# Patient Record
Sex: Female | Born: 1940 | Race: White | Hispanic: No | Marital: Married | Smoking: Never smoker
Health system: Southern US, Community
[De-identification: ages and names within clinical notes are randomized; demographics above are authoritative.]

## PROBLEM LIST (undated history)

## (undated) ENCOUNTER — Ambulatory Visit: Admission: EM

## (undated) DIAGNOSIS — R011 Cardiac murmur, unspecified: Secondary | ICD-10-CM

## (undated) DIAGNOSIS — G43909 Migraine, unspecified, not intractable, without status migrainosus: Secondary | ICD-10-CM

## (undated) DIAGNOSIS — M1990 Unspecified osteoarthritis, unspecified site: Secondary | ICD-10-CM

## (undated) DIAGNOSIS — I8290 Acute embolism and thrombosis of unspecified vein: Secondary | ICD-10-CM

## (undated) DIAGNOSIS — R7303 Prediabetes: Secondary | ICD-10-CM

## (undated) DIAGNOSIS — I10 Essential (primary) hypertension: Secondary | ICD-10-CM

## (undated) DIAGNOSIS — H269 Unspecified cataract: Secondary | ICD-10-CM

## (undated) DIAGNOSIS — D689 Coagulation defect, unspecified: Secondary | ICD-10-CM

## (undated) DIAGNOSIS — M810 Age-related osteoporosis without current pathological fracture: Secondary | ICD-10-CM

## (undated) HISTORY — PX: EYE SURGERY: SHX253

## (undated) HISTORY — PX: BUNIONECTOMY: SHX129

## (undated) HISTORY — DX: Prediabetes: R73.03

## (undated) HISTORY — PX: ABDOMINAL HYSTERECTOMY: SHX81

## (undated) HISTORY — DX: Coagulation defect, unspecified: D68.9

## (undated) HISTORY — DX: Unspecified osteoarthritis, unspecified site: M19.90

## (undated) HISTORY — DX: Cardiac murmur, unspecified: R01.1

## (undated) HISTORY — PX: KNEE ARTHROSCOPY: SUR90

## (undated) HISTORY — DX: Unspecified cataract: H26.9

## (undated) HISTORY — PX: HERNIA REPAIR: SHX51

## (undated) HISTORY — PX: BACK SURGERY: SHX140

## (undated) HISTORY — DX: Migraine, unspecified, not intractable, without status migrainosus: G43.909

## (undated) HISTORY — DX: Age-related osteoporosis without current pathological fracture: M81.0

## (undated) HISTORY — PX: BREAST SURGERY: SHX581

## (undated) HISTORY — DX: Essential (primary) hypertension: I10

## (undated) HISTORY — PX: INCISIONAL HERNIA REPAIR: SHX193

---

## 2011-09-07 DIAGNOSIS — M76899 Other specified enthesopathies of unspecified lower limb, excluding foot: Secondary | ICD-10-CM | POA: Diagnosis not present

## 2011-10-03 DIAGNOSIS — R079 Chest pain, unspecified: Secondary | ICD-10-CM | POA: Diagnosis not present

## 2011-10-03 DIAGNOSIS — R252 Cramp and spasm: Secondary | ICD-10-CM | POA: Diagnosis not present

## 2011-10-03 DIAGNOSIS — M79609 Pain in unspecified limb: Secondary | ICD-10-CM | POA: Diagnosis not present

## 2011-10-03 DIAGNOSIS — Z79899 Other long term (current) drug therapy: Secondary | ICD-10-CM | POA: Diagnosis not present

## 2011-11-17 DIAGNOSIS — G43909 Migraine, unspecified, not intractable, without status migrainosus: Secondary | ICD-10-CM | POA: Diagnosis not present

## 2011-11-17 DIAGNOSIS — M810 Age-related osteoporosis without current pathological fracture: Secondary | ICD-10-CM | POA: Diagnosis not present

## 2011-11-17 DIAGNOSIS — I10 Essential (primary) hypertension: Secondary | ICD-10-CM | POA: Diagnosis not present

## 2011-11-17 DIAGNOSIS — E785 Hyperlipidemia, unspecified: Secondary | ICD-10-CM | POA: Diagnosis not present

## 2011-11-22 DIAGNOSIS — G43909 Migraine, unspecified, not intractable, without status migrainosus: Secondary | ICD-10-CM | POA: Diagnosis not present

## 2011-11-22 DIAGNOSIS — M810 Age-related osteoporosis without current pathological fracture: Secondary | ICD-10-CM | POA: Diagnosis not present

## 2011-11-22 DIAGNOSIS — I10 Essential (primary) hypertension: Secondary | ICD-10-CM | POA: Diagnosis not present

## 2011-11-22 DIAGNOSIS — E785 Hyperlipidemia, unspecified: Secondary | ICD-10-CM | POA: Diagnosis not present

## 2011-12-27 DIAGNOSIS — M25476 Effusion, unspecified foot: Secondary | ICD-10-CM | POA: Diagnosis not present

## 2011-12-27 DIAGNOSIS — S93419A Sprain of calcaneofibular ligament of unspecified ankle, initial encounter: Secondary | ICD-10-CM | POA: Diagnosis not present

## 2012-01-12 DIAGNOSIS — S298XXA Other specified injuries of thorax, initial encounter: Secondary | ICD-10-CM | POA: Diagnosis not present

## 2012-01-26 DIAGNOSIS — H16229 Keratoconjunctivitis sicca, not specified as Sjogren's, unspecified eye: Secondary | ICD-10-CM | POA: Diagnosis not present

## 2012-01-26 DIAGNOSIS — H2510 Age-related nuclear cataract, unspecified eye: Secondary | ICD-10-CM | POA: Diagnosis not present

## 2012-01-26 DIAGNOSIS — Z961 Presence of intraocular lens: Secondary | ICD-10-CM | POA: Diagnosis not present

## 2012-01-26 DIAGNOSIS — H5200 Hypermetropia, unspecified eye: Secondary | ICD-10-CM | POA: Diagnosis not present

## 2012-02-16 DIAGNOSIS — R05 Cough: Secondary | ICD-10-CM | POA: Diagnosis not present

## 2012-02-16 DIAGNOSIS — I10 Essential (primary) hypertension: Secondary | ICD-10-CM | POA: Diagnosis not present

## 2012-02-16 DIAGNOSIS — M810 Age-related osteoporosis without current pathological fracture: Secondary | ICD-10-CM | POA: Diagnosis not present

## 2012-02-16 DIAGNOSIS — R079 Chest pain, unspecified: Secondary | ICD-10-CM | POA: Diagnosis not present

## 2012-03-21 DIAGNOSIS — Z1231 Encounter for screening mammogram for malignant neoplasm of breast: Secondary | ICD-10-CM | POA: Diagnosis not present

## 2012-03-26 DIAGNOSIS — I10 Essential (primary) hypertension: Secondary | ICD-10-CM | POA: Diagnosis not present

## 2012-05-09 DIAGNOSIS — M76899 Other specified enthesopathies of unspecified lower limb, excluding foot: Secondary | ICD-10-CM | POA: Diagnosis not present

## 2012-05-09 DIAGNOSIS — Z23 Encounter for immunization: Secondary | ICD-10-CM | POA: Diagnosis not present

## 2012-05-14 DIAGNOSIS — M461 Sacroiliitis, not elsewhere classified: Secondary | ICD-10-CM | POA: Diagnosis not present

## 2012-06-26 DIAGNOSIS — M810 Age-related osteoporosis without current pathological fracture: Secondary | ICD-10-CM | POA: Diagnosis not present

## 2012-06-26 DIAGNOSIS — I10 Essential (primary) hypertension: Secondary | ICD-10-CM | POA: Diagnosis not present

## 2012-07-16 DIAGNOSIS — L259 Unspecified contact dermatitis, unspecified cause: Secondary | ICD-10-CM | POA: Diagnosis not present

## 2012-08-02 DIAGNOSIS — M810 Age-related osteoporosis without current pathological fracture: Secondary | ICD-10-CM | POA: Diagnosis not present

## 2012-08-02 DIAGNOSIS — Z79899 Other long term (current) drug therapy: Secondary | ICD-10-CM | POA: Diagnosis not present

## 2012-08-02 DIAGNOSIS — I10 Essential (primary) hypertension: Secondary | ICD-10-CM | POA: Diagnosis not present

## 2012-08-24 DIAGNOSIS — Z13820 Encounter for screening for osteoporosis: Secondary | ICD-10-CM | POA: Diagnosis not present

## 2012-08-24 DIAGNOSIS — M899 Disorder of bone, unspecified: Secondary | ICD-10-CM | POA: Diagnosis not present

## 2012-09-03 ENCOUNTER — Telehealth: Payer: Self-pay | Admitting: *Deleted

## 2012-09-03 ENCOUNTER — Ambulatory Visit (INDEPENDENT_AMBULATORY_CARE_PROVIDER_SITE_OTHER): Payer: Medicare Other | Admitting: Internal Medicine

## 2012-09-03 ENCOUNTER — Encounter: Payer: Self-pay | Admitting: Internal Medicine

## 2012-09-03 VITALS — BP 170/80 | HR 94 | Temp 97.6°F | Resp 18 | Ht <= 58 in | Wt 106.0 lb

## 2012-09-03 DIAGNOSIS — M810 Age-related osteoporosis without current pathological fracture: Secondary | ICD-10-CM

## 2012-09-03 DIAGNOSIS — Z0000 Encounter for general adult medical examination without abnormal findings: Secondary | ICD-10-CM

## 2012-09-03 DIAGNOSIS — E785 Hyperlipidemia, unspecified: Secondary | ICD-10-CM

## 2012-09-03 DIAGNOSIS — G43909 Migraine, unspecified, not intractable, without status migrainosus: Secondary | ICD-10-CM

## 2012-09-03 DIAGNOSIS — Z90710 Acquired absence of both cervix and uterus: Secondary | ICD-10-CM

## 2012-09-03 DIAGNOSIS — Z862 Personal history of diseases of the blood and blood-forming organs and certain disorders involving the immune mechanism: Secondary | ICD-10-CM

## 2012-09-03 DIAGNOSIS — I10 Essential (primary) hypertension: Secondary | ICD-10-CM

## 2012-09-03 DIAGNOSIS — M1990 Unspecified osteoarthritis, unspecified site: Secondary | ICD-10-CM | POA: Diagnosis not present

## 2012-09-03 DIAGNOSIS — K219 Gastro-esophageal reflux disease without esophagitis: Secondary | ICD-10-CM

## 2012-09-03 DIAGNOSIS — E782 Mixed hyperlipidemia: Secondary | ICD-10-CM | POA: Insufficient documentation

## 2012-09-03 DIAGNOSIS — R0789 Other chest pain: Secondary | ICD-10-CM

## 2012-09-03 HISTORY — DX: Personal history of diseases of the blood and blood-forming organs and certain disorders involving the immune mechanism: Z86.2

## 2012-09-03 HISTORY — DX: Acquired absence of both cervix and uterus: Z90.710

## 2012-09-03 MED ORDER — LOSARTAN POTASSIUM 50 MG PO TABS: 50.0000 mg | ORAL_TABLET | Freq: Every day | ORAL | Status: DC

## 2012-09-03 NOTE — Progress Notes (Signed)
Subjective:    Patient ID: Mercedes Decker, female    DOB: [birth year], 72 y.o.   MRN: [id number]  HPI New pt .  Here for first visit.  Former care [facility] Dr. Helyn Numbers.  PMH of DJD, HTN. Migraine headache, osteoporosis on Reclast,  S/P hysterectomy and S/P bilateral breast implant removal.    Pt is concerned about two issues.  She has HTN but has stopped meds as she reports she cannot tolerate Amlodipine 5 mg and lisinopril.  She is on no meds now  See BP today  Labs brought with her done 08/02/2012 show total choleterol 211 HDL 82 and ldl 109.  Chemistries are normal. She is a non smoker and does have FH of MI in father and brother and stroke in two sisters  She also has L sided lower chest pain that radiates to her R shoulder that has occurred off and one for 2 weeks lasting 4-5 minutes.  No N/V, SOB at times especially with exertion, no diaphoresis.  She reports she does have a cardiologist in [place] (cannot recall name today) and that she had an angiogram 4 years ago and was told there was no blockage.  There is no radiation down L arm or into jaw  Last episode was last night.    Allergies  Allergen Reactions  . Vicodin (Hydrocodone-Acetaminophen) Other (See Comments)    Pt states that she stops breathing when she takes  Pain meds   Past Medical History  Diagnosis Date  . Hypertension   . Arthritis   . Migraine   . Osteoporosis    Past Surgical History  Procedure Date  . Abdominal hysterectomy   . Breast surgery   . Back surgery   . Knee arthroscopy   . Incisional hernia repair   . Bunionectomy    History   Social History  . Marital Status: Married    Spouse Name: N/A    Number of Children: N/A  . Years of Education: N/A   Occupational History  . Not on file.   Social History Main Topics  . Smoking status: Never Smoker   . Smokeless tobacco: Not on file  . Alcohol Use: No  . Drug Use: No  . Sexually Active: Yes    Birth Control/ Protection:  Post-menopausal   Other Topics Concern  . Not on file   Social History Narrative  . No narrative on file   Family History  Problem Relation Age of Onset  . Alzheimer's disease Mother   . Heart disease Father   . Cancer Sister   . Diabetes Sister   . Heart disease Brother   . Diabetes Brother   . Cancer Brother    There is no problem list on file for this patient.  Current Outpatient Prescriptions on File Prior to Visit  Medication Sig Dispense Refill  . amLODipine (NORVASC) 5 MG tablet Take 5 mg by mouth daily.      . SUMAtriptan (IMITREX) 6 MG/0.5ML SOLN injection Inject 6 mg into the skin every 2 (two) hours as needed. F           Review of Systems See HPI    Objective:   Physical Exam  Physical Exam  Nursing note and vitals reviewed.   Repeat BP  170/80  L arm Constitutional: She is oriented to person, place, and time. She appears well-developed and well-nourished.  HENT:  Head: Normocephalic and atraumatic.  Cardiovascular: Normal rate and regular rhythm. Exam reveals no  gallop and no friction rub.  No murmur heard.  Pulmonary/Chest: Breath sounds normal. She has no wheezes. She has no rales.  Ext:  No edema Neurological: She is alert and oriented to person, place, and time.  Skin: Skin is warm and dry.  Psychiatric: She has a normal mood and affect. Her behavior is normal.        Assessment & Plan:  Atypical chest pain:  EKG today shows NSR and no acute changes.  Etiology of pain unclear at this point and in part may be related to uncontrolled HTN.  See below.  I counseled pt to start a baby aspirin daily and she is to call her cardiologist as soon as she returns home for appt in his office.  It is reassuring that 4 years ago she reports a normal angiogram.  Will have pt sign for old records  Uncontrolled HTN:  I counseled pt it is absolutely essential that she take her BP pill everyday.   Will start  Cozaar 50 mg and she is to see me in one week.     History of migraine headache  Continue Imitrex  DJD  Osteoporosis  She is under care of rheumatologist and receives IV reclast  S/P hysterectomy

## 2012-09-03 NOTE — Telephone Encounter (Signed)
Pt called with  Information regarding her last stress test which was 2007 pt was not able to get an appt before vacation will call office

## 2012-09-04 ENCOUNTER — Encounter: Payer: Self-pay | Admitting: *Deleted

## 2012-09-04 LAB — CBC WITH DIFFERENTIAL/PLATELET
Basophils Absolute: 0 10*3/uL (ref 0.0–0.1)
HCT: 39.4 % (ref 36.0–46.0)
Lymphocytes Relative: 24 % (ref 12–46)
Monocytes Absolute: 0.7 10*3/uL (ref 0.1–1.0)
Neutro Abs: 5.9 10*3/uL (ref 1.7–7.7)
Neutrophils Relative %: 68 % (ref 43–77)
RDW: 13.2 % (ref 11.5–15.5)
WBC: 8.6 10*3/uL (ref 4.0–10.5)

## 2012-09-04 NOTE — Patient Instructions (Addendum)
See me in one week

## 2012-09-05 DIAGNOSIS — R21 Rash and other nonspecific skin eruption: Secondary | ICD-10-CM | POA: Diagnosis not present

## 2012-09-05 DIAGNOSIS — G5700 Lesion of sciatic nerve, unspecified lower limb: Secondary | ICD-10-CM | POA: Diagnosis not present

## 2012-09-10 DIAGNOSIS — I10 Essential (primary) hypertension: Secondary | ICD-10-CM | POA: Diagnosis not present

## 2012-09-10 DIAGNOSIS — R0789 Other chest pain: Secondary | ICD-10-CM | POA: Diagnosis not present

## 2012-09-11 ENCOUNTER — Encounter: Payer: Self-pay | Admitting: Internal Medicine

## 2012-09-11 ENCOUNTER — Ambulatory Visit (INDEPENDENT_AMBULATORY_CARE_PROVIDER_SITE_OTHER): Payer: Medicare Other | Admitting: Internal Medicine

## 2012-09-11 VITALS — BP 148/78 | HR 80 | Temp 97.7°F | Resp 18 | Wt 107.0 lb

## 2012-09-11 DIAGNOSIS — I10 Essential (primary) hypertension: Secondary | ICD-10-CM

## 2012-09-11 DIAGNOSIS — I359 Nonrheumatic aortic valve disorder, unspecified: Secondary | ICD-10-CM | POA: Diagnosis not present

## 2012-09-11 DIAGNOSIS — I059 Rheumatic mitral valve disease, unspecified: Secondary | ICD-10-CM | POA: Diagnosis not present

## 2012-09-11 DIAGNOSIS — R011 Cardiac murmur, unspecified: Secondary | ICD-10-CM

## 2012-09-11 DIAGNOSIS — I379 Nonrheumatic pulmonary valve disorder, unspecified: Secondary | ICD-10-CM | POA: Diagnosis not present

## 2012-09-11 DIAGNOSIS — R0789 Other chest pain: Secondary | ICD-10-CM | POA: Diagnosis not present

## 2012-09-11 DIAGNOSIS — M810 Age-related osteoporosis without current pathological fracture: Secondary | ICD-10-CM

## 2012-09-11 DIAGNOSIS — E785 Hyperlipidemia, unspecified: Secondary | ICD-10-CM

## 2012-09-11 DIAGNOSIS — I369 Nonrheumatic tricuspid valve disorder, unspecified: Secondary | ICD-10-CM | POA: Diagnosis not present

## 2012-09-11 NOTE — Patient Instructions (Addendum)
See me at CPE 

## 2012-09-11 NOTE — Progress Notes (Signed)
Subjective:    Patient ID: Mercedes Decker, female    DOB: [birth year], 72 y.o.   MRN: [id number]  HPI Mercedes Decker returns for follow up after taking Cozaar for one week.  She has been checking her BP at home and last night she reports SBP  128.  She is asymptomatic  She did see her cardiologist in [place].  She has a stress test and an ECHO scheduled for today  Allergies  Allergen Reactions  . Vicodin (Hydrocodone-Acetaminophen) Other (See Comments)    Pt states that she stops breathing when she takes  Pain meds   Past Medical History  Diagnosis Date  . Hypertension   . Arthritis   . Migraine   . Osteoporosis    Past Surgical History  Procedure Date  . Abdominal hysterectomy   . Breast surgery   . Back surgery   . Knee arthroscopy   . Incisional hernia repair   . Bunionectomy    History   Social History  . Marital Status: Married    Spouse Name: N/A    Number of Children: N/A  . Years of Education: N/A   Occupational History  . Not on file.   Social History Main Topics  . Smoking status: Never Smoker   . Smokeless tobacco: Not on file  . Alcohol Use: No  . Drug Use: No  . Sexually Active: Yes    Birth Control/ Protection: Post-menopausal   Other Topics Concern  . Not on file   Social History Narrative  . No narrative on file   Family History  Problem Relation Age of Onset  . Alzheimer's disease Mother   . Heart disease Father   . Cancer Sister   . Diabetes Sister   . Heart disease Brother   . Diabetes Brother   . Cancer Brother    Patient Active Problem List  Diagnosis  . DJD (degenerative joint disease)  . HTN (hypertension)  . S/P hysterectomy  . Osteoporosis  . Migraine  . History of anemia  . Hyperlipidemia  . GERD (gastroesophageal reflux disease)  . Murmur, cardiac   Current Outpatient Prescriptions on File Prior to Visit  Medication Sig Dispense Refill  . CALCIUM-MAGNESIUM-ZINC PO Take by mouth.      . cholecalciferol (VITAMIN D)  1000 UNITS tablet Take 2,000 Units by mouth daily.      Marland Kitchen losartan (COZAAR) 50 MG tablet Take 1 tablet (50 mg total) by mouth daily.  30 tablet  1  . pyridOXINE (VITAMIN B-6) 100 MG tablet Take 100 mg by mouth daily.      . SUMAtriptan (IMITREX) 6 MG/0.5ML SOLN injection Inject 6 mg into the skin every 2 (two) hours as needed. F      . vitamin C (ASCORBIC ACID) 500 MG tablet Take 500 mg by mouth daily.      . Zoledronic Acid (RECLAST IV) Inject into the vein.      Marland Kitchen amLODipine (NORVASC) 5 MG tablet Take 5 mg by mouth daily.           Review of Systems See HPI    Objective:   Physical Exam  Physical Exam  Nursing note and vitals reviewed.  Constitutional: She is oriented to person, place, and time. She appears well-developed and well-nourished.  HENT:  Head: Normocephalic and atraumatic.  Cardiovascular: Normal rate and regular rhythm. Exam reveals no gallop and no friction rub.  She has soft 2/6 SEM  LUSB.  Pulmonary/Chest: Breath sounds normal. She has  no wheezes. She has no rales.  Neurological: She is alert and oriented to person, place, and time.  Skin: Skin is warm and dry.  Psychiatric: She has a normal mood and affect. Her behavior is normal.       Assessment & Plan:  HTN  Continue Cozaar 50 mg daily  Keep CPE appt with me  Cardiac murmur:  She has ECHO scheduled for today  Hyperlipidemia  DAsh diet  Will further discuss at [facility]

## 2012-09-17 ENCOUNTER — Encounter: Payer: Self-pay | Admitting: *Deleted

## 2012-11-06 ENCOUNTER — Telehealth: Payer: Self-pay | Admitting: *Deleted

## 2012-11-06 ENCOUNTER — Ambulatory Visit (INDEPENDENT_AMBULATORY_CARE_PROVIDER_SITE_OTHER): Payer: Medicare Other | Admitting: Internal Medicine

## 2012-11-06 ENCOUNTER — Encounter: Payer: Self-pay | Admitting: Internal Medicine

## 2012-11-06 VITALS — BP 136/70 | HR 86 | Temp 97.1°F | Resp 18 | Ht <= 58 in | Wt 108.0 lb

## 2012-11-06 DIAGNOSIS — E785 Hyperlipidemia, unspecified: Secondary | ICD-10-CM

## 2012-11-06 DIAGNOSIS — G43909 Migraine, unspecified, not intractable, without status migrainosus: Secondary | ICD-10-CM

## 2012-11-06 DIAGNOSIS — M810 Age-related osteoporosis without current pathological fracture: Secondary | ICD-10-CM

## 2012-11-06 DIAGNOSIS — Z139 Encounter for screening, unspecified: Secondary | ICD-10-CM

## 2012-11-06 DIAGNOSIS — I10 Essential (primary) hypertension: Secondary | ICD-10-CM

## 2012-11-06 DIAGNOSIS — R011 Cardiac murmur, unspecified: Secondary | ICD-10-CM

## 2012-11-06 DIAGNOSIS — K219 Gastro-esophageal reflux disease without esophagitis: Secondary | ICD-10-CM

## 2012-11-06 DIAGNOSIS — R82998 Other abnormal findings in urine: Secondary | ICD-10-CM

## 2012-11-06 LAB — POCT URINALYSIS DIPSTICK
Bilirubin, UA: NEGATIVE
Blood, UA: NEGATIVE
Glucose, UA: NEGATIVE
Ketones, UA: NEGATIVE
Nitrite, UA: NEGATIVE
Spec Grav, UA: 1.015
pH, UA: 6.5

## 2012-11-06 NOTE — Telephone Encounter (Signed)
MM record request

## 2012-11-06 NOTE — Progress Notes (Signed)
Subjective:    Patient ID: Mercedes Decker, female    DOB: [birth year], 72 y.o.   MRN: [id number]  HPI [name] is here for CPE  HTN tolerating Cozaar well Headaches are gone.   No chest pain or pressure  She does have some sharp pain underneath L ribs that she believes may be due to heavy shoveling.  No radiation,m no SOB,  No diaphroesis or N/V  She gardens and uses chain saw  "I think I do too much"   She is on Reclast for osteoporosis and since starting Reclast she notices cramps in fingers at times they get "stuck" and they won't move.  She takes supplemental Mg, calcium and zinc along with a Multivirtamin  She declines guaiac today as she recently had a colonoscopy in the last 2 years.  No polyps reported  MM last done [facility]  It has been less than one year  She did see her cardiologist and she reports she had an ECHO and stress test that pt reports is normal  I do not have copy  Allergies  Allergen Reactions  . Ace Inhibitors Cough  . Vicodin (Hydrocodone-Acetaminophen) Other (See Comments)    Pt states that she stops breathing when she takes  Pain meds   Past Medical History  Diagnosis Date  . Hypertension   . Arthritis   . Migraine   . Osteoporosis    Past Surgical History  Procedure Laterality Date  . Abdominal hysterectomy    . Breast surgery    . Back surgery    . Knee arthroscopy    . Incisional hernia repair    . Bunionectomy     History   Social History  . Marital Status: Married    Spouse Name: N/A    Number of Children: N/A  . Years of Education: N/A   Occupational History  . Not on file.   Social History Main Topics  . Smoking status: Never Smoker   . Smokeless tobacco: Not on file  . Alcohol Use: No  . Drug Use: No  . Sexually Active: Yes    Birth Control/ Protection: Post-menopausal   Other Topics Concern  . Not on file   Social History Narrative  . No narrative on file   Family History  Problem Relation Age of Onset  .  Alzheimer's disease Mother   . Heart disease Father   . Cancer Sister   . Diabetes Sister   . Heart disease Brother   . Diabetes Brother   . Cancer Brother    Patient Active Problem List  Diagnosis  . DJD (degenerative joint disease)  . HTN (hypertension)  . S/P hysterectomy  . Osteoporosis  . Migraine  . History of anemia  . Hyperlipidemia  . GERD (gastroesophageal reflux disease)  . Murmur, cardiac   Current Outpatient Prescriptions on File Prior to Visit  Medication Sig Dispense Refill  . CALCIUM-MAGNESIUM-ZINC PO Take by mouth.      . cholecalciferol (VITAMIN D) 1000 UNITS tablet Take 2,000 Units by mouth daily.      Marland Kitchen losartan (COZAAR) 50 MG tablet Take 1 tablet (50 mg total) by mouth daily.  30 tablet  1  . pyridOXINE (VITAMIN B-6) 100 MG tablet Take 100 mg by mouth daily.      . SUMAtriptan (IMITREX) 6 MG/0.5ML SOLN injection Inject 6 mg into the skin every 2 (two) hours as needed. F      . vitamin C (ASCORBIC ACID) 500 MG  tablet Take 500 mg by mouth daily.      . Zoledronic Acid (RECLAST IV) Inject into the vein.       No current facility-administered medications on file prior to visit.       Review of Systems See HPI    Objective:   Physical Exam Physical Exam  Nursing note and vitals reviewed.  Constitutional: She is oriented to person, place, and time. She appears well-developed and well-nourished.  HENT:  Head: Normocephalic and atraumatic.  Right Ear: Tympanic membrane and ear canal normal. No drainage. Tympanic membrane is not injected and not erythematous.  Left Ear: Tympanic membrane and ear canal normal. No drainage. Tympanic membrane is not injected and not erythematous.  Nose: Nose normal. Right sinus exhibits no maxillary sinus tenderness and no frontal sinus tenderness. Left sinus exhibits no maxillary sinus tenderness and no frontal sinus tenderness.  Mouth/Throat: Oropharynx is clear and moist. No oral lesions. No oropharyngeal exudate.  Eyes:  Conjunctivae and EOM are normal. Pupils are equal, round, and reactive to light.  Neck: Normal range of motion. Neck supple. No JVD present. Carotid bruit is not present. No mass and no thyromegaly present.  Cardiovascular: Normal rate, regular rhythm, S1 normal, S2 normal and intact distal pulses. Exam reveals no gallop and no friction rub.  No murmur heard.  Pulses:  Carotid pulses are 2+ on the right side, and 2+ on the left side.  Dorsalis pedis pulses are 2+ on the right side, and 2+ on the left side.  No carotid bruit. No LE edema  Pulmonary/Chest: Breath sounds normal. She has no wheezes. She has no rales. She exhibits no tenderness.   Breast no discrete masses no nipple discharge no axillary adenopathy Abdominal: Soft. Bowel sounds are normal. She exhibits no distension and no mass. There is no hepatosplenomegaly. There is no tenderness. There is no CVA tenderness.  Rectal deferred Musculoskeletal: Normal range of motion.  No active synovitis to joints.  Lymphadenopathy:  She has no cervical adenopathy.  She has no axillary adenopathy.  Right: No inguinal and no supraclavicular adenopathy present.  Left: No inguinal and no supraclavicular adenopathy present.  Neurological: She is alert and oriented to person, place, and time. She has normal strength and normal reflexes. She displays no tremor. No cranial nerve deficit or sensory deficit. Coordination and gait normal.  Skin: Skin is warm and dry. No rash noted. No cyanosis. Nails show no clubbing.  Psychiatric: She has a normal mood and affect. Her speech is normal and behavior is normal. Cognition and memory are normal.           Assessment & Plan:  Health Maintenance  Will need to get mm from [facility].  S/P hysterectomy  See scanned sheet  U/A  Shows mod leukocytes  Pt asymptomatic  Will send for culture  HTN  Continue cozaar  Check TSH  Heat Murmur  Pt to send records from Dr. Tiburcio Pea cardiologist   Muscle cramping   Possible due to REclast  I advised pt  To check with her rheumatologist.  She is already taking Mg supplements  R sided thoracic pain  Ok to take OTC Ibuprofen  May be muscle related.  She is to see me if office if not better.   GERD    Hyperlipidemia  DASH diet  Mild  Osteoprosis on REclast

## 2012-11-06 NOTE — Patient Instructions (Addendum)
See me as needed 

## 2012-11-07 ENCOUNTER — Telehealth: Payer: Self-pay | Admitting: *Deleted

## 2012-11-15 NOTE — Telephone Encounter (Signed)
error 

## 2012-11-16 LAB — URINE CULTURE

## 2012-11-19 ENCOUNTER — Encounter: Payer: Self-pay | Admitting: *Deleted

## 2012-11-19 DIAGNOSIS — E785 Hyperlipidemia, unspecified: Secondary | ICD-10-CM | POA: Diagnosis not present

## 2012-11-19 DIAGNOSIS — Z7983 Long term (current) use of bisphosphonates: Secondary | ICD-10-CM | POA: Diagnosis not present

## 2012-11-19 DIAGNOSIS — M810 Age-related osteoporosis without current pathological fracture: Secondary | ICD-10-CM | POA: Diagnosis not present

## 2012-11-19 DIAGNOSIS — I10 Essential (primary) hypertension: Secondary | ICD-10-CM | POA: Diagnosis not present

## 2012-11-21 DIAGNOSIS — Z7983 Long term (current) use of bisphosphonates: Secondary | ICD-10-CM | POA: Diagnosis not present

## 2012-11-21 DIAGNOSIS — M810 Age-related osteoporosis without current pathological fracture: Secondary | ICD-10-CM | POA: Diagnosis not present

## 2012-11-21 DIAGNOSIS — E785 Hyperlipidemia, unspecified: Secondary | ICD-10-CM | POA: Diagnosis not present

## 2012-11-21 DIAGNOSIS — I10 Essential (primary) hypertension: Secondary | ICD-10-CM | POA: Diagnosis not present

## 2012-11-25 ENCOUNTER — Encounter: Payer: Self-pay | Admitting: Internal Medicine

## 2012-12-01 ENCOUNTER — Encounter: Payer: Self-pay | Admitting: Internal Medicine

## 2012-12-10 ENCOUNTER — Encounter: Payer: Self-pay | Admitting: *Deleted

## 2013-01-22 DIAGNOSIS — M722 Plantar fascial fibromatosis: Secondary | ICD-10-CM | POA: Diagnosis not present

## 2013-01-31 ENCOUNTER — Other Ambulatory Visit: Payer: Self-pay | Admitting: Internal Medicine

## 2013-01-31 NOTE — Telephone Encounter (Signed)
Refill request

## 2013-02-21 DIAGNOSIS — M722 Plantar fascial fibromatosis: Secondary | ICD-10-CM | POA: Diagnosis not present

## 2013-02-21 DIAGNOSIS — M25473 Effusion, unspecified ankle: Secondary | ICD-10-CM | POA: Diagnosis not present

## 2013-02-21 DIAGNOSIS — M25476 Effusion, unspecified foot: Secondary | ICD-10-CM | POA: Diagnosis not present

## 2013-02-27 DIAGNOSIS — H2510 Age-related nuclear cataract, unspecified eye: Secondary | ICD-10-CM | POA: Diagnosis not present

## 2013-02-27 DIAGNOSIS — H16229 Keratoconjunctivitis sicca, not specified as Sjogren's, unspecified eye: Secondary | ICD-10-CM | POA: Diagnosis not present

## 2013-02-27 DIAGNOSIS — H5200 Hypermetropia, unspecified eye: Secondary | ICD-10-CM | POA: Diagnosis not present

## 2013-02-27 DIAGNOSIS — H524 Presbyopia: Secondary | ICD-10-CM | POA: Diagnosis not present

## 2013-03-04 ENCOUNTER — Other Ambulatory Visit: Payer: Self-pay | Admitting: *Deleted

## 2013-03-04 NOTE — Telephone Encounter (Signed)
[name]  I only gave a 30 day supply as she needs to have chemistries done  I have no K on her.  Schedule OV with me,  If she just wants to do labs and not have an office visit.  Order CMP  When I have results I can order a 90 day supply.  I

## 2013-03-04 NOTE — Telephone Encounter (Signed)
Pt request a 90 day supply

## 2013-03-15 DIAGNOSIS — M76899 Other specified enthesopathies of unspecified lower limb, excluding foot: Secondary | ICD-10-CM | POA: Diagnosis not present

## 2013-03-25 NOTE — Telephone Encounter (Signed)
Notified pt of need for labs for further refills via VM message

## 2013-03-28 DIAGNOSIS — M722 Plantar fascial fibromatosis: Secondary | ICD-10-CM | POA: Diagnosis not present

## 2013-04-03 ENCOUNTER — Emergency Department (HOSPITAL_BASED_OUTPATIENT_CLINIC_OR_DEPARTMENT_OTHER): Payer: Medicare Other

## 2013-04-03 ENCOUNTER — Encounter: Payer: Self-pay | Admitting: Internal Medicine

## 2013-04-03 ENCOUNTER — Encounter (HOSPITAL_BASED_OUTPATIENT_CLINIC_OR_DEPARTMENT_OTHER): Payer: Self-pay | Admitting: *Deleted

## 2013-04-03 ENCOUNTER — Encounter: Payer: Medicare Other | Admitting: Internal Medicine

## 2013-04-03 ENCOUNTER — Emergency Department (HOSPITAL_BASED_OUTPATIENT_CLINIC_OR_DEPARTMENT_OTHER)
Admission: EM | Admit: 2013-04-03 | Discharge: 2013-04-03 | Disposition: A | Discharge status: Home / Self Care | Payer: Medicare Other | Attending: Emergency Medicine | Admitting: Emergency Medicine

## 2013-04-03 DIAGNOSIS — Z79899 Other long term (current) drug therapy: Secondary | ICD-10-CM | POA: Insufficient documentation

## 2013-04-03 DIAGNOSIS — R112 Nausea with vomiting, unspecified: Secondary | ICD-10-CM | POA: Insufficient documentation

## 2013-04-03 DIAGNOSIS — R197 Diarrhea, unspecified: Secondary | ICD-10-CM | POA: Diagnosis not present

## 2013-04-03 DIAGNOSIS — G43909 Migraine, unspecified, not intractable, without status migrainosus: Secondary | ICD-10-CM | POA: Diagnosis not present

## 2013-04-03 DIAGNOSIS — R51 Headache: Secondary | ICD-10-CM | POA: Diagnosis not present

## 2013-04-03 DIAGNOSIS — Z90710 Acquired absence of both cervix and uterus: Secondary | ICD-10-CM | POA: Diagnosis not present

## 2013-04-03 DIAGNOSIS — I10 Essential (primary) hypertension: Secondary | ICD-10-CM | POA: Diagnosis not present

## 2013-04-03 DIAGNOSIS — Z9889 Other specified postprocedural states: Secondary | ICD-10-CM | POA: Insufficient documentation

## 2013-04-03 DIAGNOSIS — M810 Age-related osteoporosis without current pathological fracture: Secondary | ICD-10-CM | POA: Insufficient documentation

## 2013-04-03 DIAGNOSIS — M129 Arthropathy, unspecified: Secondary | ICD-10-CM | POA: Diagnosis not present

## 2013-04-03 MED ORDER — SODIUM CHLORIDE 0.9 % IV BOLUS (SEPSIS)
1000.0000 mL | Freq: Once | INTRAVENOUS | Status: AC
  Administered 2013-04-03: 1000 mL via INTRAVENOUS

## 2013-04-03 MED ORDER — METOCLOPRAMIDE HCL 5 MG/ML IJ SOLN
10.0000 mg | Freq: Once | INTRAMUSCULAR | Status: AC
  Administered 2013-04-03: 10 mg via INTRAVENOUS
  Filled 2013-04-03: qty 2

## 2013-04-03 MED ORDER — ONDANSETRON HCL 4 MG/2ML IJ SOLN
4.0000 mg | Freq: Once | INTRAMUSCULAR | Status: AC
  Administered 2013-04-03: 4 mg via INTRAVENOUS
  Filled 2013-04-03: qty 2

## 2013-04-03 MED ORDER — DIPHENHYDRAMINE HCL 50 MG/ML IJ SOLN
25.0000 mg | Freq: Once | INTRAMUSCULAR | Status: AC
  Administered 2013-04-03: 25 mg via INTRAVENOUS
  Filled 2013-04-03: qty 1

## 2013-04-03 MED ORDER — DEXAMETHASONE SODIUM PHOSPHATE 10 MG/ML IJ SOLN
10.0000 mg | Freq: Once | INTRAMUSCULAR | Status: AC
  Administered 2013-04-03: 10 mg via INTRAVENOUS
  Filled 2013-04-03: qty 1

## 2013-04-03 MED ORDER — SUMATRIPTAN SUCCINATE 6 MG/0.5ML ~~LOC~~ SOAJ: 1.0000 | Freq: Once | SUBCUTANEOUS | Status: DC

## 2013-04-03 NOTE — ED Provider Notes (Signed)
CSN: [id number]     Arrival date & time 04/03/13  1425 History     First MD Initiated Contact with Patient 04/03/13 1501     Chief Complaint  Patient presents with  . Migraine   (Consider location/radiation/quality/duration/timing/severity/associated sxs/prior Treatment) HPI Comments: Hx of migraines, out of imitrex today. Woke up with headache, similar to all previous migraines. Diffuse headache with associated nausea and vomiting.   Patient is a 72 y.o. female presenting with migraines. The history is provided by the patient.  Migraine This is a recurrent problem. The current episode started 6 to 12 hours ago. The problem occurs constantly. The problem has not changed since onset.Associated symptoms include headaches. Pertinent negatives include no chest pain, no abdominal pain and no shortness of breath. Exacerbated by: lights, sounds. Nothing relieves the symptoms. Treatments tried: NSAIDs. The treatment provided no relief.    Past Medical History  Diagnosis Date  . Hypertension   . Arthritis   . Migraine   . Osteoporosis    Past Surgical History  Procedure Laterality Date  . Abdominal hysterectomy    . Breast surgery    . Back surgery    . Knee arthroscopy    . Incisional hernia repair    . Bunionectomy     Family History  Problem Relation Age of Onset  . Alzheimer's disease Mother   . Heart disease Father   . Cancer Sister   . Diabetes Sister   . Heart disease Brother   . Diabetes Brother   . Cancer Brother    History  Substance Use Topics  . Smoking status: Never Smoker   . Smokeless tobacco: Not on file  . Alcohol Use: No   OB History   Grav Para Term Preterm Abortions TAB SAB Ect Mult Living                 Review of Systems  Constitutional: Negative for fever and chills.  Respiratory: Negative for shortness of breath.   Cardiovascular: Negative for chest pain.  Gastrointestinal: Positive for nausea, vomiting and diarrhea. Negative for abdominal  pain.  Neurological: Positive for headaches.  All other systems reviewed and are negative.    Allergies  Ace inhibitors and Vicodin  Home Medications   Current Outpatient Rx  Name  Route  Sig  Dispense  Refill  . CALCIUM-MAGNESIUM-ZINC PO   Oral   Take by mouth.         . cholecalciferol (VITAMIN D) 1000 UNITS tablet   Oral   Take 2,000 Units by mouth daily.         Marland Kitchen losartan (COZAAR) 50 MG tablet   Oral   Take 1 tablet (50 mg total) by mouth daily.   30 tablet   1   . pyridOXINE (VITAMIN B-6) 100 MG tablet   Oral   Take 100 mg by mouth daily.         . SUMAtriptan 6 MG/0.5ML SOAJ   Subcutaneous   Inject 1 Syringe into the skin once. Inject 6 mg into skin at onset of headache one time   1 Syringe   3   . vitamin C (ASCORBIC ACID) 500 MG tablet   Oral   Take 500 mg by mouth daily.          BP 147/93  Pulse 78  Temp(Src) 97.8 F (36.6 C) (Oral)  Resp 18  Ht 4\' 11"  (1.499 m)  Wt 106 lb (48.081 kg)  BMI 21.4 kg/m2  SpO2  100% Physical Exam  Nursing note and vitals reviewed. Constitutional: She is oriented to person, place, and time. She appears well-developed and well-nourished. No distress.  HENT:  Head: Normocephalic and atraumatic.  Right Ear: External ear normal.  Left Ear: External ear normal.  Mouth/Throat: Oropharynx is clear and moist.  Eyes: EOM are normal. Pupils are equal, round, and reactive to light.  Neck: Normal range of motion. Neck supple.  Cardiovascular: Normal rate and regular rhythm.  Exam reveals no friction rub.   No murmur heard. Pulmonary/Chest: Effort normal and breath sounds normal. No respiratory distress. She has no wheezes. She has no rales.  Abdominal: Soft. She exhibits no distension. There is no tenderness. There is no rebound.  Musculoskeletal: Normal range of motion. She exhibits no edema.  Neurological: She is alert and oriented to person, place, and time. No cranial nerve deficit. She exhibits normal muscle  tone. Coordination normal.  Skin: She is not diaphoretic.    ED Course   Procedures (including critical care time)  Labs Reviewed - No data to display Ct Head Wo Contrast  04/03/2013   *RADIOLOGY REPORT*  Clinical Data: Migraine, frontal headache  CT HEAD WITHOUT CONTRAST  Technique:  Contiguous axial images were obtained from the base of the skull through the vertex without contrast.  Comparison: None.  Findings: No skull fracture is noted.  Paranasal sinuses and mastoid air cells are unremarkable.  No intracranial hemorrhage, mass effect or midline shift.  No hydrocephalus.  The gray and white matter differentiation is preserved.  No intra or extra-axial fluid collection.  No acute cortical infarction.  No mass lesion is noted on this unenhanced scan.  IMPRESSION: No acute intracranial abnormality.   Original Report Authenticated By: Natasha Mead, M.D.   1. Migraine     MDM   72 year old female presents with headache. She has a history of migraines. She states this feels just like a previous migraines. She woke up with headache, which she says she always does. She has associated nausea and vomiting. She did not have any Imitrex, which she says she normally takes for this. Patient had no relief with NSAIDs. Denies any fever. Here, vitals stable, alert, oriented, cooperative. No altered metal status. Neuro status is normal. Remainder of exam is normal. With her history of similar migraines, I feel like this is likely migraine. Due to her advanced age, I will CT her head for possible mass occupying lesion. No thunderclap onset, no concern for subarachnoid hemorrhage.  CT without lesions. Headache improved after migraine cocktail. Patient stable for discharge.   Dagmar Hait, MD 04/03/13 2330

## 2013-04-03 NOTE — Progress Notes (Signed)
  Subjective:    Patient ID: Mercedes Decker, female    DOB: 07/22/1941, 72 y.o.   MRN: [id number]  HPI  Erroneous   Review of Systems     Objective:   Physical Exam        Assessment & Plan:

## 2013-04-03 NOTE — ED Notes (Signed)
Patient sent from upstairs for migraine. Out of imitrex

## 2013-04-08 ENCOUNTER — Encounter: Payer: Self-pay | Admitting: Internal Medicine

## 2013-04-08 ENCOUNTER — Ambulatory Visit (INDEPENDENT_AMBULATORY_CARE_PROVIDER_SITE_OTHER): Payer: Medicare Other | Admitting: Internal Medicine

## 2013-04-08 VITALS — BP 136/78 | HR 68 | Temp 97.7°F | Resp 18 | Wt 106.0 lb

## 2013-04-08 DIAGNOSIS — T148XXA Other injury of unspecified body region, initial encounter: Secondary | ICD-10-CM

## 2013-04-08 DIAGNOSIS — G43909 Migraine, unspecified, not intractable, without status migrainosus: Secondary | ICD-10-CM

## 2013-04-08 DIAGNOSIS — Z1231 Encounter for screening mammogram for malignant neoplasm of breast: Secondary | ICD-10-CM

## 2013-04-08 DIAGNOSIS — Z139 Encounter for screening, unspecified: Secondary | ICD-10-CM

## 2013-04-08 LAB — COMPREHENSIVE METABOLIC PANEL
ALT: 24 U/L (ref 0–35)
AST: 20 U/L (ref 0–37)
Albumin: 4.1 g/dL (ref 3.5–5.2)
Alkaline Phosphatase: 41 U/L (ref 39–117)
Calcium: 9.9 mg/dL (ref 8.4–10.5)
Chloride: 106 mEq/L (ref 96–112)
Potassium: 4.4 mEq/L (ref 3.5–5.3)
Sodium: 142 mEq/L (ref 135–145)
Total Protein: 6.6 g/dL (ref 6.0–8.3)

## 2013-04-08 LAB — APTT: aPTT: 27 seconds (ref 24–37)

## 2013-04-08 MED ORDER — LOSARTAN POTASSIUM 50 MG PO TABS: 50.0000 mg | ORAL_TABLET | Freq: Every day | ORAL | Status: DC

## 2013-04-08 NOTE — Patient Instructions (Signed)
Activate my chart 

## 2013-04-08 NOTE — Progress Notes (Signed)
Subjective:    Patient ID: Mercedes Decker, female    DOB: August 10, 1941, 72 y.o.   MRN: [id number]  HPI Mercedes Decker is here for acute visit and ER follow up.  Treated with injectable meds for migraine headache and she feels much better.  She uses injectable imitrex at home  She has noted easy bruising of both arms with minor trauma.  No epistaxis,  No gum bleeding  No gyn bleeding.    She feels fullness and pressure in both ears  Allergies  Allergen Reactions  . Ace Inhibitors Cough  . Vicodin (Hydrocodone-Acetaminophen) Other (See Comments)    Pt states that she stops breathing when she takes  Pain meds   Past Medical History  Diagnosis Date  . Hypertension   . Arthritis   . Migraine   . Osteoporosis    Past Surgical History  Procedure Laterality Date  . Abdominal hysterectomy    . Breast surgery    . Back surgery    . Knee arthroscopy    . Incisional hernia repair    . Bunionectomy     History   Social History  . Marital Status: Married    Spouse Name: N/A    Number of Children: N/A  . Years of Education: N/A   Occupational History  . Not on file.   Social History Main Topics  . Smoking status: Never Smoker   . Smokeless tobacco: Not on file  . Alcohol Use: No  . Drug Use: No  . Sexually Active: Yes    Birth Control/ Protection: Post-menopausal   Other Topics Concern  . Not on file   Social History Narrative  . No narrative on file   Family History  Problem Relation Age of Onset  . Alzheimer's disease Mother   . Heart disease Father   . Cancer Sister   . Diabetes Sister   . Heart disease Brother   . Diabetes Brother   . Cancer Brother    Patient Active Problem List   Diagnosis Date Noted  . Osteopenia 11/25/2012  . Murmur, cardiac 09/11/2012  . DJD (degenerative joint disease) 09/03/2012  . HTN (hypertension) 09/03/2012  . S/P hysterectomy 09/03/2012  . Osteoporosis 09/03/2012  . Migraine 09/03/2012  . History of anemia 09/03/2012  .  Hyperlipidemia 09/03/2012  . GERD (gastroesophageal reflux disease) 09/03/2012   Current Outpatient Prescriptions on File Prior to Visit  Medication Sig Dispense Refill  . CALCIUM-MAGNESIUM-ZINC PO Take by mouth.      . cholecalciferol (VITAMIN D) 1000 UNITS tablet Take 2,000 Units by mouth daily.      Marland Kitchen pyridOXINE (VITAMIN B-6) 100 MG tablet Take 100 mg by mouth daily.      . SUMAtriptan 6 MG/0.5ML SOAJ Inject 1 Syringe into the skin once. Inject 6 mg into skin at onset of headache one time  1 Syringe  3  . vitamin C (ASCORBIC ACID) 500 MG tablet Take 500 mg by mouth daily.       No current facility-administered medications on file prior to visit.      Review of Systems    see HPI Objective:   Physical Exam  Physical Exam  Nursing note and vitals reviewed.  Constitutional: She is oriented to person, place, and time. She appears well-developed and well-nourished.  HENT:  Head: Normocephalic and atraumatic.   TM's  Bilateral serous effusion.   Cardiovascular: Normal rate and regular rhythm. Exam reveals no gallop and no friction rub.  No murmur heard.  Pulmonary/Chest: Breath sounds normal. She has no wheezes. She has no rales.  Neurological: She is alert and oriented to person, place, and time.  Skin: Skin is warm and dry.   She does have healing eccchymotic areas both arms Psychiatric: She has a normal mood and affect. Her behavior is normal.            Assessment & Plan:  Migraine  OK for injectable imitrex prn  Bruising  Likely due to skin thinning and ASA use  Will check PT and PTT  Serous effusions  OTC allergy med of choice  Will schedule 3-D mm

## 2013-04-09 ENCOUNTER — Encounter: Payer: Self-pay | Admitting: *Deleted

## 2013-04-09 ENCOUNTER — Telehealth: Payer: Self-pay | Admitting: *Deleted

## 2013-04-09 NOTE — Telephone Encounter (Signed)
Pt notified of recent test results and a copy of labs mailed to home address.

## 2013-04-09 NOTE — Telephone Encounter (Signed)
Message copied by Mathews Robinsons on Tue Apr 09, 2013  1:20 PM ------      Message from: Raechel Chute D      Created: Tue Apr 09, 2013  8:00 AM       Call  [name] and let her know that her clotting blood tests are nomal            Ok to mail to her ------

## 2013-04-09 NOTE — Telephone Encounter (Signed)
Message copied by Mathews Robinsons on Tue Apr 09, 2013  1:18 PM ------      Message from: Raechel Chute D      Created: Tue Apr 09, 2013  8:00 AM       Call  [name] and let her know that her clotting blood tests are nomal            Ok to mail to her ------

## 2013-04-15 ENCOUNTER — Encounter: Payer: Self-pay | Admitting: *Deleted

## 2013-04-24 ENCOUNTER — Ambulatory Visit
Admission: RE | Admit: 2013-04-24 | Discharge: 2013-04-24 | Disposition: A | Discharge status: Home / Self Care | Payer: Medicare Other | Source: Ambulatory Visit

## 2013-04-24 ENCOUNTER — Other Ambulatory Visit: Payer: Self-pay | Admitting: Internal Medicine

## 2013-04-24 ENCOUNTER — Ambulatory Visit: Admission: RE | Admit: 2013-04-24 | Payer: Medicare Other | Source: Ambulatory Visit

## 2013-04-24 DIAGNOSIS — Z1231 Encounter for screening mammogram for malignant neoplasm of breast: Secondary | ICD-10-CM

## 2013-05-02 DIAGNOSIS — R0602 Shortness of breath: Secondary | ICD-10-CM | POA: Diagnosis not present

## 2013-05-02 DIAGNOSIS — I10 Essential (primary) hypertension: Secondary | ICD-10-CM | POA: Diagnosis not present

## 2013-05-02 DIAGNOSIS — R5381 Other malaise: Secondary | ICD-10-CM | POA: Diagnosis not present

## 2013-05-07 DIAGNOSIS — Z23 Encounter for immunization: Secondary | ICD-10-CM | POA: Diagnosis not present

## 2013-05-15 DIAGNOSIS — R0602 Shortness of breath: Secondary | ICD-10-CM | POA: Diagnosis not present

## 2013-05-17 DIAGNOSIS — N3946 Mixed incontinence: Secondary | ICD-10-CM | POA: Diagnosis not present

## 2013-05-17 DIAGNOSIS — R350 Frequency of micturition: Secondary | ICD-10-CM | POA: Diagnosis not present

## 2013-05-23 DIAGNOSIS — I10 Essential (primary) hypertension: Secondary | ICD-10-CM | POA: Diagnosis not present

## 2013-05-23 DIAGNOSIS — R0602 Shortness of breath: Secondary | ICD-10-CM | POA: Diagnosis not present

## 2013-05-23 DIAGNOSIS — R5381 Other malaise: Secondary | ICD-10-CM | POA: Diagnosis not present

## 2013-06-07 DIAGNOSIS — R5381 Other malaise: Secondary | ICD-10-CM | POA: Diagnosis not present

## 2013-06-07 DIAGNOSIS — R0602 Shortness of breath: Secondary | ICD-10-CM | POA: Diagnosis not present

## 2013-06-07 DIAGNOSIS — I10 Essential (primary) hypertension: Secondary | ICD-10-CM | POA: Diagnosis not present

## 2013-07-16 ENCOUNTER — Encounter: Payer: Self-pay | Admitting: Internal Medicine

## 2013-07-16 ENCOUNTER — Ambulatory Visit (INDEPENDENT_AMBULATORY_CARE_PROVIDER_SITE_OTHER): Payer: Medicare Other | Admitting: Internal Medicine

## 2013-07-16 VITALS — BP 130/67 | HR 76 | Temp 98.3°F | Resp 16 | Wt 109.0 lb

## 2013-07-16 DIAGNOSIS — R1012 Left upper quadrant pain: Secondary | ICD-10-CM

## 2013-07-16 DIAGNOSIS — R1013 Epigastric pain: Secondary | ICD-10-CM

## 2013-07-16 DIAGNOSIS — R0781 Pleurodynia: Secondary | ICD-10-CM

## 2013-07-16 DIAGNOSIS — R079 Chest pain, unspecified: Secondary | ICD-10-CM

## 2013-07-16 MED ORDER — NABUMETONE 500 MG PO TABS: ORAL_TABLET | ORAL | Status: DC

## 2013-07-16 MED ORDER — LOSARTAN POTASSIUM 100 MG PO TABS: 100.0000 mg | ORAL_TABLET | Freq: Every day | ORAL | Status: DC

## 2013-07-16 MED ORDER — AMLODIPINE BESYLATE 5 MG PO TABS: 5.0000 mg | ORAL_TABLET | Freq: Every day | ORAL | Status: DC

## 2013-07-16 NOTE — Patient Instructions (Signed)
Stop by Xray to set up xray films  Order in epic  See me in 7-10 days

## 2013-07-16 NOTE — Progress Notes (Signed)
Subjective:    Patient ID: Mercedes Decker, female    DOB: 1940/10/02, 72 y.o.   MRN: [id number]  HPI  Mercedes Decker is here for acute visit.    She describes 8 months of LUQ pain  That now occures daily for the past 4 weeks.  LUQ that radiates to back.  She has seen her cardiologist (Dr. Jacinto Halim)  who did and EKG and ECHO and told pt he did not think pain was coming from her heart  Paint does worsen with movement of L arm and certain positions.  She exercises with sit ups. And this can worsen pain  No heartburn,  No urinary symptoms  Occasional nausea  No vaginal discharge.  Appetite ok LBM today  No change from normal   Allergies  Allergen Reactions  . Ace Inhibitors Cough  . Vicodin [Hydrocodone-Acetaminophen] Other (See Comments)    Pt states that she stops breathing when she takes  Pain meds   Past Medical History  Diagnosis Date  . Hypertension   . Arthritis   . Migraine   . Osteoporosis    Past Surgical History  Procedure Laterality Date  . Abdominal hysterectomy    . Breast surgery    . Back surgery    . Knee arthroscopy    . Incisional hernia repair    . Bunionectomy     History   Social History  . Marital Status: Married    Spouse Name: N/A    Number of Children: N/A  . Years of Education: N/A   Occupational History  . Not on file.   Social History Main Topics  . Smoking status: Never Smoker   . Smokeless tobacco: Not on file  . Alcohol Use: No  . Drug Use: No  . Sexual Activity: Yes    Birth Control/ Protection: Post-menopausal   Other Topics Concern  . Not on file   Social History Narrative  . No narrative on file   Family History  Problem Relation Age of Onset  . Alzheimer's disease Mother   . Heart disease Father   . Cancer Sister   . Diabetes Sister   . Heart disease Brother   . Diabetes Brother   . Cancer Brother    Patient Active Problem List   Diagnosis Date Noted  . Osteopenia 11/25/2012  . Murmur, cardiac 09/11/2012  . DJD  (degenerative joint disease) 09/03/2012  . HTN (hypertension) 09/03/2012  . S/P hysterectomy 09/03/2012  . Osteoporosis 09/03/2012  . Migraine 09/03/2012  . History of anemia 09/03/2012  . Hyperlipidemia 09/03/2012  . GERD (gastroesophageal reflux disease) 09/03/2012   Current Outpatient Prescriptions on File Prior to Visit  Medication Sig Dispense Refill  . CALCIUM-MAGNESIUM-ZINC PO Take by mouth.      . cholecalciferol (VITAMIN D) 1000 UNITS tablet Take 2,000 Units by mouth daily.      Marland Kitchen losartan (COZAAR) 50 MG tablet Take 1 tablet (50 mg total) by mouth daily.  30 tablet  5  . pyridOXINE (VITAMIN B-6) 100 MG tablet Take 100 mg by mouth daily.      . SUMAtriptan 6 MG/0.5ML SOAJ Inject 1 Syringe into the skin once. Inject 6 mg into skin at onset of headache one time  1 Syringe  3  . vitamin C (ASCORBIC ACID) 500 MG tablet Take 500 mg by mouth daily.       No current facility-administered medications on file prior to visit.      Review of Systems  see HPI Objective:   Physical Exam Physical Exam  Nursing note and vitals reviewed.  Constitutional: She is oriented to person, place, and time. She appears well-developed and well-nourished.  HENT:  Head: Normocephalic and atraumatic.  Cardiovascular: Normal rate and regular rhythm. Exam reveals no gallop and no friction rub.  No murmur heard.  Pulmonary/Chest: Breath sounds normal. She has no wheezes. She has no rales.  Some tenderness to Left side of ribs Abd  BS +  No HSM.  Pain to deep palpation in LUQ  No rebound or referred rebound. Neurological: She is alert and oriented to person, place, and time.  Skin: Skin is warm and dry.  Psychiatric: She has a normal mood and affect. Her behavior is normal.              Assessment & Plan:  LUQ  Pain  U/A normal  Will get CXR and rib films along with abd ultrasound.  Check labs today.   Stop ibuprofen  And take Relafen 500 mg bid until I see her again.  May be M/S in nature  but will also look at Gallbladder.  ADvised to hold off on sit ups for now until I see her again

## 2013-07-18 DIAGNOSIS — M722 Plantar fascial fibromatosis: Secondary | ICD-10-CM | POA: Diagnosis not present

## 2013-07-18 DIAGNOSIS — M216X9 Other acquired deformities of unspecified foot: Secondary | ICD-10-CM | POA: Diagnosis not present

## 2013-07-19 ENCOUNTER — Ambulatory Visit (HOSPITAL_BASED_OUTPATIENT_CLINIC_OR_DEPARTMENT_OTHER)
Admission: RE | Admit: 2013-07-19 | Discharge: 2013-07-19 | Disposition: A | Discharge status: Home / Self Care | Payer: Medicare Other | Source: Ambulatory Visit | Attending: Internal Medicine | Admitting: Internal Medicine

## 2013-07-19 DIAGNOSIS — I10 Essential (primary) hypertension: Secondary | ICD-10-CM | POA: Diagnosis not present

## 2013-07-19 DIAGNOSIS — R079 Chest pain, unspecified: Secondary | ICD-10-CM | POA: Insufficient documentation

## 2013-07-19 DIAGNOSIS — Z90710 Acquired absence of both cervix and uterus: Secondary | ICD-10-CM | POA: Insufficient documentation

## 2013-07-19 DIAGNOSIS — R1012 Left upper quadrant pain: Secondary | ICD-10-CM | POA: Diagnosis not present

## 2013-07-19 DIAGNOSIS — R0781 Pleurodynia: Secondary | ICD-10-CM

## 2013-07-23 ENCOUNTER — Ambulatory Visit (INDEPENDENT_AMBULATORY_CARE_PROVIDER_SITE_OTHER): Payer: Medicare Other | Admitting: Internal Medicine

## 2013-07-23 ENCOUNTER — Encounter: Payer: Self-pay | Admitting: Internal Medicine

## 2013-07-23 VITALS — BP 131/60 | HR 81 | Temp 98.1°F | Resp 18 | Wt 108.0 lb

## 2013-07-23 DIAGNOSIS — R1013 Epigastric pain: Secondary | ICD-10-CM | POA: Diagnosis not present

## 2013-07-23 DIAGNOSIS — Z800 Family history of malignant neoplasm of digestive organs: Secondary | ICD-10-CM

## 2013-07-23 DIAGNOSIS — R634 Abnormal weight loss: Secondary | ICD-10-CM

## 2013-07-23 NOTE — Patient Instructions (Signed)
Stop Ibuprofen and relafen  Take two tylenol bid as needed for pain  Call your GI MD for an appt  Call me if any worsening

## 2013-07-23 NOTE — Progress Notes (Signed)
Subjective:    Patient ID: Mercedes Decker, female    DOB: 1940/12/17, 72 y.o.   MRN: [id number]  HPI Mercedes Decker  Is here for follow up.  She said the Relafen caused red eyes and a headache and she only took it for 3 days.  Pain in  Upper epigastric and LUQ  Somewhat less but still there.  She reports she has used Ibuprofen  Twice a day  "for months."    She has  Brother with stomach cancer and this is her fear  CXR and ribs negative.  GB u/S shows no stones but bowel gas limits visualization of other organs  Allergies  Allergen Reactions  . Ace Inhibitors Cough  . Vicodin [Hydrocodone-Acetaminophen] Other (See Comments)    Pt states that she stops breathing when she takes  Pain meds   Past Medical History  Diagnosis Date  . Hypertension   . Arthritis   . Migraine   . Osteoporosis    Past Surgical History  Procedure Laterality Date  . Abdominal hysterectomy    . Breast surgery    . Back surgery    . Knee arthroscopy    . Incisional hernia repair    . Bunionectomy     History   Social History  . Marital Status: Married    Spouse Name: N/A    Number of Children: N/A  . Years of Education: N/A   Occupational History  . Not on file.   Social History Main Topics  . Smoking status: Never Smoker   . Smokeless tobacco: Not on file  . Alcohol Use: No  . Drug Use: No  . Sexual Activity: Yes    Birth Control/ Protection: Post-menopausal   Other Topics Concern  . Not on file   Social History Narrative  . No narrative on file   Family History  Problem Relation Age of Onset  . Alzheimer's disease Mother   . Heart disease Father   . Cancer Sister   . Diabetes Sister   . Heart disease Brother   . Diabetes Brother   . Cancer Brother    Patient Active Problem List   Diagnosis Date Noted  . Osteopenia 11/25/2012  . Murmur, cardiac 09/11/2012  . DJD (degenerative joint disease) 09/03/2012  . HTN (hypertension) 09/03/2012  . S/P hysterectomy 09/03/2012  .  Osteoporosis 09/03/2012  . Migraine 09/03/2012  . History of anemia 09/03/2012  . Hyperlipidemia 09/03/2012  . GERD (gastroesophageal reflux disease) 09/03/2012   Current Outpatient Prescriptions on File Prior to Visit  Medication Sig Dispense Refill  . amLODipine (NORVASC) 5 MG tablet Take 1 tablet (5 mg total) by mouth daily.  90 tablet  1  . CALCIUM-MAGNESIUM-ZINC PO Take by mouth.      . cholecalciferol (VITAMIN D) 1000 UNITS tablet Take 2,000 Units by mouth daily.      Marland Kitchen losartan (COZAAR) 100 MG tablet Take 1 tablet (100 mg total) by mouth daily.  90 tablet  1  . nabumetone (RELAFEN) 500 MG tablet Take one tablet bid with food  60 tablet  1  . pyridOXINE (VITAMIN B-6) 100 MG tablet Take 100 mg by mouth daily.      . SUMAtriptan 6 MG/0.5ML SOAJ Inject 1 Syringe into the skin once. Inject 6 mg into skin at onset of headache one time  1 Syringe  3  . vitamin C (ASCORBIC ACID) 500 MG tablet Take 500 mg by mouth daily.       No current  facility-administered medications on file prior to visit.       Review of Systems See HPI    Objective:   Physical Exam  Physical Exam  Nursing note and vitals reviewed.  Constitutional: She is oriented to person, place, and time. She appears well-developed and well-nourished.  HENT:  Head: Normocephalic and atraumatic.  Cardiovascular: Normal rate and regular rhythm. Exam reveals no gallop and no friction rub.  No murmur heard.  Pulmonary/Chest: Breath sounds normal. She has no wheezes. She has no rales.  Neurological: She is alert and oriented to person, place, and time.  Skin: Skin is warm and dry.  Psychiatric: She has a normal mood and affect. Her behavior is normal.             Assessment & Plan:  Upper quadrant pain  Intermittant,  Brother with stomach cancer.    Etiology still unclear   I ordered a CEA level in pt.  I advised Medicare may not pay for this.    Also advised to make appt with her  GI MD in [place] for possible upper  endoscopy.   She is to stop her Ibuprofen and advised to take Tylenol  2 extra strength BID .  Prilosec otc  She is to call me if any worsening.     Pt voices understanding

## 2013-07-29 ENCOUNTER — Encounter: Payer: Self-pay | Admitting: *Deleted

## 2013-09-02 DIAGNOSIS — D235 Other benign neoplasm of skin of trunk: Secondary | ICD-10-CM | POA: Diagnosis not present

## 2013-09-02 DIAGNOSIS — L820 Inflamed seborrheic keratosis: Secondary | ICD-10-CM | POA: Diagnosis not present

## 2013-09-02 DIAGNOSIS — D485 Neoplasm of uncertain behavior of skin: Secondary | ICD-10-CM | POA: Diagnosis not present

## 2013-10-23 DIAGNOSIS — M19049 Primary osteoarthritis, unspecified hand: Secondary | ICD-10-CM | POA: Diagnosis not present

## 2013-10-30 ENCOUNTER — Other Ambulatory Visit: Payer: Self-pay | Admitting: *Deleted

## 2013-10-30 NOTE — Telephone Encounter (Signed)
Refill request

## 2013-10-31 MED ORDER — LOSARTAN POTASSIUM 100 MG PO TABS: 100.0000 mg | ORAL_TABLET | Freq: Every day | ORAL | Status: DC

## 2013-12-30 ENCOUNTER — Other Ambulatory Visit: Payer: Self-pay | Admitting: Internal Medicine

## 2013-12-30 NOTE — Telephone Encounter (Signed)
Refill request

## 2013-12-31 NOTE — Telephone Encounter (Signed)
Mercedes Decker  I ordered 30 tablets of her Bp med.  I need to see her in office before I will order any more.    Give her 30 min visit.  Route back with date

## 2014-01-02 NOTE — Telephone Encounter (Signed)
LVM message regarding need for an appt

## 2014-01-13 NOTE — Telephone Encounter (Signed)
Left another message regarding BP management

## 2014-01-15 ENCOUNTER — Encounter: Payer: Self-pay | Admitting: Internal Medicine

## 2014-01-15 ENCOUNTER — Ambulatory Visit (INDEPENDENT_AMBULATORY_CARE_PROVIDER_SITE_OTHER): Payer: Medicare Other | Admitting: Internal Medicine

## 2014-01-15 VITALS — BP 118/66 | HR 80 | Temp 98.1°F | Resp 16 | Ht 59.0 in | Wt 108.0 lb

## 2014-01-15 DIAGNOSIS — G8929 Other chronic pain: Secondary | ICD-10-CM | POA: Diagnosis not present

## 2014-01-15 DIAGNOSIS — I10 Essential (primary) hypertension: Secondary | ICD-10-CM | POA: Diagnosis not present

## 2014-01-15 DIAGNOSIS — R1013 Epigastric pain: Secondary | ICD-10-CM | POA: Diagnosis not present

## 2014-01-15 LAB — CBC WITH DIFFERENTIAL/PLATELET
BASOS ABS: 0.1 10*3/uL (ref 0.0–0.1)
Basophils Relative: 1 % (ref 0–1)
EOS ABS: 0.1 10*3/uL (ref 0.0–0.7)
Eosinophils Relative: 2 % (ref 0–5)
HCT: 37.2 % (ref 36.0–46.0)
Hemoglobin: 12.8 g/dL (ref 12.0–15.0)
LYMPHS ABS: 1.7 10*3/uL (ref 0.7–4.0)
LYMPHS PCT: 29 % (ref 12–46)
MCH: 31.8 pg (ref 26.0–34.0)
MCHC: 34.4 g/dL (ref 30.0–36.0)
MCV: 92.3 fL (ref 78.0–100.0)
Monocytes Absolute: 0.8 10*3/uL (ref 0.1–1.0)
Monocytes Relative: 13 % — ABNORMAL HIGH (ref 3–12)
NEUTROS PCT: 55 % (ref 43–77)
Neutro Abs: 3.2 10*3/uL (ref 1.7–7.7)
PLATELETS: 274 10*3/uL (ref 150–400)
RBC: 4.03 MIL/uL (ref 3.87–5.11)
RDW: 13.4 % (ref 11.5–15.5)
WBC: 5.8 10*3/uL (ref 4.0–10.5)

## 2014-01-15 LAB — LIPID PANEL
CHOLESTEROL: 212 mg/dL — AB (ref 0–200)
HDL: 67 mg/dL (ref >39)
LDL CALC: 117 mg/dL — AB (ref 0–99)
Total CHOL/HDL Ratio: 3.2 Ratio
Triglycerides: 141 mg/dL (ref <150)
VLDL: 28 mg/dL (ref 0–40)

## 2014-01-15 LAB — COMPREHENSIVE METABOLIC PANEL
ALT: 31 U/L (ref 0–35)
AST: 24 U/L (ref 0–37)
Albumin: 4.1 g/dL (ref 3.5–5.2)
Alkaline Phosphatase: 62 U/L (ref 39–117)
BILIRUBIN TOTAL: 0.3 mg/dL (ref 0.2–1.2)
BUN: 15 mg/dL (ref 6–23)
CHLORIDE: 107 meq/L (ref 96–112)
CO2: 27 meq/L (ref 19–32)
Calcium: 9.7 mg/dL (ref 8.4–10.5)
Creat: 0.96 mg/dL (ref 0.50–1.10)
Glucose, Bld: 101 mg/dL — ABNORMAL HIGH (ref 70–99)
Potassium: 5.1 mEq/L (ref 3.5–5.3)
SODIUM: 140 meq/L (ref 135–145)
TOTAL PROTEIN: 6.4 g/dL (ref 6.0–8.3)

## 2014-01-15 LAB — TSH: TSH: 3.382 u[IU]/mL (ref 0.350–4.500)

## 2014-01-15 MED ORDER — LOSARTAN POTASSIUM 100 MG PO TABS: 100.0000 mg | ORAL_TABLET | Freq: Every day | ORAL | Status: DC

## 2014-01-15 MED ORDER — AMLODIPINE BESYLATE 5 MG PO TABS: 5.0000 mg | ORAL_TABLET | Freq: Every day | ORAL | Status: DC

## 2014-01-15 NOTE — Patient Instructions (Signed)
Schedule CPE

## 2014-01-15 NOTE — Addendum Note (Signed)
Addended by: Emi Belfast D on: 01/15/2014 09:49 AM   Modules accepted: Orders

## 2014-01-15 NOTE — Progress Notes (Signed)
Subjective:    Patient ID: Mercedes Decker, female    DOB: [birth year], 73 y.o.   MRN: [id number]  HPI Mercedes Decker is here for follow up of BP  Tolerating Norvasc well.  NO LE Edema  Recently returned from [place].     Upper quadrant pain:  Pt reports she did see her GI MD in [place]  (cannot recall name)  Who did an upper endoscopy and told her "things were fine"  But she does not empty her stomach well  .  She takes an ASA only twice a week. She did not get CEA level done as it was too expensive  Allergies  Allergen Reactions  . Ace Inhibitors Cough  . Vicodin [Hydrocodone-Acetaminophen] Other (See Comments)    Pt states that she stops breathing when she takes  Pain meds   Past Medical History  Diagnosis Date  . Hypertension   . Arthritis   . Migraine   . Osteoporosis    Past Surgical History  Procedure Laterality Date  . Abdominal hysterectomy    . Breast surgery    . Back surgery    . Knee arthroscopy    . Incisional hernia repair    . Bunionectomy     History   Social History  . Marital Status: Married    Spouse Name: N/A    Number of Children: N/A  . Years of Education: N/A   Occupational History  . Not on file.   Social History Main Topics  . Smoking status: Never Smoker   . Smokeless tobacco: Not on file  . Alcohol Use: No  . Drug Use: No  . Sexual Activity: Yes    Birth Control/ Protection: Post-menopausal   Other Topics Concern  . Not on file   Social History Narrative  . No narrative on file   Family History  Problem Relation Age of Onset  . Alzheimer's disease Mother   . Heart disease Father   . Cancer Sister   . Diabetes Sister   . Heart disease Brother   . Diabetes Brother   . Cancer Brother    Patient Active Problem List   Diagnosis Date Noted  . Osteopenia 11/25/2012  . Murmur, cardiac 09/11/2012  . DJD (degenerative joint disease) 09/03/2012  . HTN (hypertension) 09/03/2012  . S/P hysterectomy 09/03/2012  . Osteoporosis  09/03/2012  . Migraine 09/03/2012  . History of anemia 09/03/2012  . Hyperlipidemia 09/03/2012  . GERD (gastroesophageal reflux disease) 09/03/2012   Current Outpatient Prescriptions on File Prior to Visit  Medication Sig Dispense Refill  . amLODipine (NORVASC) 5 MG tablet TAKE 1 TABLET DAILY.  30 tablet  0  . CALCIUM-MAGNESIUM-ZINC PO Take by mouth.      . cholecalciferol (VITAMIN D) 1000 UNITS tablet Take 2,000 Units by mouth daily.      Marland Kitchen losartan (COZAAR) 100 MG tablet Take 1 tablet (100 mg total) by mouth daily.  90 tablet  1  . pyridOXINE (VITAMIN B-6) 100 MG tablet Take 100 mg by mouth daily.      . vitamin C (ASCORBIC ACID) 500 MG tablet Take 500 mg by mouth daily.      . SUMAtriptan 6 MG/0.5ML SOAJ Inject 1 Syringe into the skin once. Inject 6 mg into skin at onset of headache one time  1 Syringe  3   No current facility-administered medications on file prior to visit.       Review of Systems See HPI    Objective:  Physical Exam  Physical Exam  Nursing note and vitals reviewed.  Constitutional: She is oriented to person, place, and time. She appears well-developed and well-nourished.  HENT:  Head: Normocephalic and atraumatic.  Cardiovascular: Normal rate and regular rhythm. Exam reveals no gallop and no friction rub.  No murmur heard.  Pulmonary/Chest: Breath sounds normal. She has no wheezes. She has no rales.  Neurological: She is alert and oriented to person, place, and time.  Skin: Skin is warm and dry.  Psychiatric: She has a normal mood and affect. Her behavior is normal.        Assessment & Plan:  HTN  Continue norvasc  Will check labs today  Upper abd pain  Improving.  Had GI work up   Schedule CPE

## 2014-01-16 ENCOUNTER — Encounter: Payer: Self-pay | Admitting: *Deleted

## 2014-02-06 DIAGNOSIS — M654 Radial styloid tenosynovitis [de Quervain]: Secondary | ICD-10-CM | POA: Diagnosis not present

## 2014-02-06 DIAGNOSIS — M79609 Pain in unspecified limb: Secondary | ICD-10-CM | POA: Diagnosis not present

## 2014-02-06 DIAGNOSIS — M19049 Primary osteoarthritis, unspecified hand: Secondary | ICD-10-CM | POA: Diagnosis not present

## 2014-03-06 DIAGNOSIS — R1013 Epigastric pain: Secondary | ICD-10-CM | POA: Diagnosis not present

## 2014-03-06 DIAGNOSIS — H2510 Age-related nuclear cataract, unspecified eye: Secondary | ICD-10-CM | POA: Diagnosis not present

## 2014-03-06 DIAGNOSIS — H5200 Hypermetropia, unspecified eye: Secondary | ICD-10-CM | POA: Diagnosis not present

## 2014-03-06 DIAGNOSIS — H524 Presbyopia: Secondary | ICD-10-CM | POA: Diagnosis not present

## 2014-03-06 DIAGNOSIS — H02839 Dermatochalasis of unspecified eye, unspecified eyelid: Secondary | ICD-10-CM | POA: Diagnosis not present

## 2014-03-06 DIAGNOSIS — R109 Unspecified abdominal pain: Secondary | ICD-10-CM | POA: Diagnosis not present

## 2014-03-06 DIAGNOSIS — K5900 Constipation, unspecified: Secondary | ICD-10-CM | POA: Diagnosis not present

## 2014-03-21 DIAGNOSIS — M19049 Primary osteoarthritis, unspecified hand: Secondary | ICD-10-CM | POA: Diagnosis not present

## 2014-03-21 DIAGNOSIS — M79609 Pain in unspecified limb: Secondary | ICD-10-CM | POA: Diagnosis not present

## 2014-04-29 ENCOUNTER — Other Ambulatory Visit: Payer: Self-pay | Admitting: Internal Medicine

## 2014-05-23 DIAGNOSIS — Z23 Encounter for immunization: Secondary | ICD-10-CM | POA: Diagnosis not present

## 2014-05-26 DIAGNOSIS — M79609 Pain in unspecified limb: Secondary | ICD-10-CM | POA: Diagnosis not present

## 2014-05-26 DIAGNOSIS — M654 Radial styloid tenosynovitis [de Quervain]: Secondary | ICD-10-CM | POA: Diagnosis not present

## 2014-05-26 DIAGNOSIS — M19049 Primary osteoarthritis, unspecified hand: Secondary | ICD-10-CM | POA: Diagnosis not present

## 2014-05-29 DIAGNOSIS — Z23 Encounter for immunization: Secondary | ICD-10-CM | POA: Diagnosis not present

## 2014-06-25 ENCOUNTER — Other Ambulatory Visit: Payer: Self-pay | Admitting: Internal Medicine

## 2014-06-25 ENCOUNTER — Encounter: Payer: Self-pay | Admitting: Internal Medicine

## 2014-06-25 ENCOUNTER — Ambulatory Visit (INDEPENDENT_AMBULATORY_CARE_PROVIDER_SITE_OTHER): Payer: Medicare Other | Admitting: Internal Medicine

## 2014-06-25 VITALS — BP 123/58 | HR 101 | Temp 98.3°F | Resp 16 | Ht <= 58 in | Wt 101.0 lb

## 2014-06-25 DIAGNOSIS — R1314 Dysphagia, pharyngoesophageal phase: Secondary | ICD-10-CM | POA: Diagnosis not present

## 2014-06-25 DIAGNOSIS — Z1211 Encounter for screening for malignant neoplasm of colon: Secondary | ICD-10-CM | POA: Diagnosis not present

## 2014-06-25 DIAGNOSIS — Z0189 Encounter for other specified special examinations: Secondary | ICD-10-CM

## 2014-06-25 DIAGNOSIS — Z1231 Encounter for screening mammogram for malignant neoplasm of breast: Secondary | ICD-10-CM

## 2014-06-25 DIAGNOSIS — Z0000 Encounter for general adult medical examination without abnormal findings: Secondary | ICD-10-CM

## 2014-06-25 DIAGNOSIS — E785 Hyperlipidemia, unspecified: Secondary | ICD-10-CM | POA: Diagnosis not present

## 2014-06-25 DIAGNOSIS — I10 Essential (primary) hypertension: Secondary | ICD-10-CM

## 2014-06-25 DIAGNOSIS — M150 Primary generalized (osteo)arthritis: Secondary | ICD-10-CM

## 2014-06-25 DIAGNOSIS — M159 Polyosteoarthritis, unspecified: Secondary | ICD-10-CM

## 2014-06-25 DIAGNOSIS — M810 Age-related osteoporosis without current pathological fracture: Secondary | ICD-10-CM

## 2014-06-25 LAB — POCT URINALYSIS DIPSTICK
Bilirubin, UA: NEGATIVE
Blood, UA: NEGATIVE
Glucose, UA: NEGATIVE
KETONES UA: NEGATIVE
LEUKOCYTES UA: NEGATIVE
NITRITE UA: NEGATIVE
PH UA: 6.5
PROTEIN UA: NEGATIVE
Spec Grav, UA: 1.02
UROBILINOGEN UA: NEGATIVE

## 2014-06-25 LAB — HEMOCCULT GUIAC POC 1CARD (OFFICE): FECAL OCCULT BLD: NEGATIVE

## 2014-06-25 MED ORDER — PANTOPRAZOLE SODIUM 40 MG PO TBEC: 40.0000 mg | DELAYED_RELEASE_TABLET | Freq: Every day | ORAL | Status: DC

## 2014-06-25 NOTE — Patient Instructions (Signed)
[facility]  Barium swallow at GI on [address]   See me as needed

## 2014-06-25 NOTE — Progress Notes (Signed)
Subjective:    Patient ID: Mercedes Decker, female    DOB: 07-Mar-1941, 73 y.o.   MRN: [id number]  HPI  12/2013 note HTN Continue norvasc Will check labs today  Upper abd pain Improving. Had GI work up  [place] is here for CPE HM:  She is due for mm,  S/P hysterectomy,  Had colonoscopy 2011 no polyps.   Last Dexa 2013  Osteopenia   She does report she was seen at [facility] for dysphagia.  She was found to be Hpyloir positive and she was treated with multiple antibiotic course. She is not on an a PPI.  She does report swallowing is better now.    Problem list and meds reveiwed    Allergies  Allergen Reactions  . Ace Inhibitors Cough  . Vicodin [Hydrocodone-Acetaminophen] Other (See Comments)    Pt states that she stops breathing when she takes  Pain meds   Past Medical History  Diagnosis Date  . Hypertension   . Arthritis   . Migraine   . Osteoporosis    Past Surgical History  Procedure Laterality Date  . Abdominal hysterectomy    . Breast surgery    . Back surgery    . Knee arthroscopy    . Incisional hernia repair    . Bunionectomy     History   Social History  . Marital Status: Married    Spouse Name: N/A    Number of Children: N/A  . Years of Education: N/A   Occupational History  . Not on file.   Social History Main Topics  . Smoking status: Never Smoker   . Smokeless tobacco: Not on file  . Alcohol Use: No  . Drug Use: No  . Sexual Activity: Yes    Birth Control/ Protection: Post-menopausal   Other Topics Concern  . Not on file   Social History Narrative  . No narrative on file   Family History  Problem Relation Age of Onset  . Alzheimer's disease Mother   . Heart disease Father   . Cancer Sister   . Diabetes Sister   . Heart disease Brother   . Diabetes Brother   . Cancer Brother    Patient Active Problem List   Diagnosis Date Noted  . Osteopenia 11/25/2012  . Murmur, cardiac 09/11/2012  . DJD (degenerative  joint disease) 09/03/2012  . HTN (hypertension) 09/03/2012  . S/P hysterectomy 09/03/2012  . Osteoporosis 09/03/2012  . Migraine 09/03/2012  . History of anemia 09/03/2012  . Hyperlipidemia 09/03/2012  . GERD (gastroesophageal reflux disease) 09/03/2012   Current Outpatient Prescriptions on File Prior to Visit  Medication Sig Dispense Refill  . amLODipine (NORVASC) 5 MG tablet Take 1 tablet (5 mg total) by mouth daily.  90 tablet  1  . CALCIUM-MAGNESIUM-ZINC PO Take by mouth.      . cholecalciferol (VITAMIN D) 1000 UNITS tablet Take 2,000 Units by mouth daily.      Marland Kitchen losartan (COZAAR) 100 MG tablet Take 1 tablet (100 mg total) by mouth daily.  90 tablet  1  . pyridOXINE (VITAMIN B-6) 100 MG tablet Take 100 mg by mouth daily.      . SUMAtriptan 6 MG/0.5ML SOAJ Inject 1 Syringe into the skin once. Inject 6 mg into skin at onset of headache one time  1 Syringe  3  . vitamin C (ASCORBIC ACID) 500 MG tablet Take 500 mg by mouth daily.  No current facility-administered medications on file prior to visit.      Review of Systems See HPI    Objective:   Physical Exam  Physical Exam  Nursing note and vitals reviewed.  Constitutional: She is oriented to person, place, and time. She appears well-developed and well-nourished.  HENT:  Head: Normocephalic and atraumatic.  Right Ear: Tympanic membrane and ear canal normal. No drainage. Tympanic membrane is not injected and not erythematous.  Left Ear: Tympanic membrane and ear canal normal. No drainage. Tympanic membrane is not injected and not erythematous.  Nose: Nose normal. Right sinus exhibits no maxillary sinus tenderness and no frontal sinus tenderness. Left sinus exhibits no maxillary sinus tenderness and no frontal sinus tenderness.  Mouth/Throat: Oropharynx is clear and moist. No oral lesions. No oropharyngeal exudate.  Eyes: Conjunctivae and EOM are normal. Pupils are equal, round, and reactive to light.  Neck: Normal range of  motion. Neck supple. No JVD present. Carotid bruit is not present. No mass and no thyromegaly present.  Cardiovascular: Normal rate, regular rhythm, S1 normal, S2 normal and intact distal pulses. Exam reveals no gallop and no friction rub.  No murmur heard.  Pulses:  Carotid pulses are 2+ on the right side, and 2+ on the left side.  Dorsalis pedis pulses are 2+ on the right side, and 2+ on the left side.  No carotid bruit. No LE edema  Pulmonary/Chest: Breath sounds normal. She has no wheezes. She has no rales. She exhibits no tenderness.  Breasts no discrete masses no nipple discharge no axillary adenopathy bilaterally Abdominal: Soft. Bowel sounds are normal. She exhibits no distension and no mass. There is no hepatosplenomegaly. There is no tenderness. There is no CVA tenderness.  Musculoskeletal: Normal range of motion.  No active synovitis to joints.  Lymphadenopathy:  She has no cervical adenopathy.  She has no axillary adenopathy.  Right: No inguinal and no supraclavicular adenopathy present.  Left: No inguinal and no supraclavicular adenopathy present.  Neurological: She is alert and oriented to person, place, and time. She has normal strength and normal reflexes. She displays no tremor. No cranial nerve deficit or sensory deficit. Coordination and gait normal.  Skin: Skin is warm and dry. No rash noted. No cyanosis. Nails show no clubbing.   Tanned  And several tattoos Psychiatric: She has a normal mood and affect. Her speech is normal and behavior is normal. Cognition and memory are normal.          Assessment & Plan:  HM  Schedule 3D mm,  S/P hysterectomy,  Will need colonosocopy next year  Counseled lung CA guideline.  Pt quit 1975,   15 yr smoker less than 1 ppd.  Pt has dermatologist that checksk  HTN  Continue meds   Dysphagia /H pylori:  She received ABx treatment will get barium swallow to assess for anatomic abnromality   Will RX protonix 40 mg daily   Hyperlipidemia   Labs good  DJd  On diclofenac for 1 year   Counseled regarding CV risk with long term use.    Osteoporosis  Schedule DEXA   She stopped Reclast approx 2 years ago.   .  Was prescribed by hematologist in [place].

## 2014-06-27 DIAGNOSIS — S63502A Unspecified sprain of left wrist, initial encounter: Secondary | ICD-10-CM | POA: Diagnosis not present

## 2014-06-27 DIAGNOSIS — M25532 Pain in left wrist: Secondary | ICD-10-CM | POA: Diagnosis not present

## 2014-07-06 ENCOUNTER — Telehealth: Payer: Self-pay | Admitting: Internal Medicine

## 2014-07-06 NOTE — Telephone Encounter (Signed)
Mercedes Decker   Is this pt scheduled for a barium swallow?   I don't see it in EPIC  It was ordered on 10/28 for dysphagia

## 2014-07-07 NOTE — Telephone Encounter (Signed)
Patient is scheduled for Barium swallow 07/10/2014 at 11:15

## 2014-07-10 ENCOUNTER — Ambulatory Visit
Admission: RE | Admit: 2014-07-10 | Discharge: 2014-07-10 | Disposition: A | Discharge status: Home / Self Care | Payer: Medicare Other | Source: Ambulatory Visit | Attending: Internal Medicine | Admitting: Internal Medicine

## 2014-07-10 DIAGNOSIS — R1314 Dysphagia, pharyngoesophageal phase: Secondary | ICD-10-CM

## 2014-07-10 DIAGNOSIS — R1310 Dysphagia, unspecified: Secondary | ICD-10-CM | POA: Diagnosis not present

## 2014-07-14 NOTE — Progress Notes (Signed)
Pt is aware that her swallowing test is normal. She will contact us if she has increased swallowing problems-eh

## 2014-07-29 ENCOUNTER — Other Ambulatory Visit: Payer: Self-pay | Admitting: Internal Medicine

## 2014-07-29 NOTE — Telephone Encounter (Signed)
Refill request

## 2014-08-08 DIAGNOSIS — M79602 Pain in left arm: Secondary | ICD-10-CM | POA: Diagnosis not present

## 2014-08-08 DIAGNOSIS — M654 Radial styloid tenosynovitis [de Quervain]: Secondary | ICD-10-CM | POA: Diagnosis not present

## 2014-08-25 ENCOUNTER — Ambulatory Visit
Admission: RE | Admit: 2014-08-25 | Discharge: 2014-08-25 | Disposition: A | Discharge status: Home / Self Care | Payer: Medicare Other | Source: Ambulatory Visit | Attending: Internal Medicine | Admitting: Internal Medicine

## 2014-08-25 DIAGNOSIS — M810 Age-related osteoporosis without current pathological fracture: Secondary | ICD-10-CM | POA: Diagnosis not present

## 2014-08-25 DIAGNOSIS — Z1231 Encounter for screening mammogram for malignant neoplasm of breast: Secondary | ICD-10-CM | POA: Diagnosis not present

## 2014-08-31 ENCOUNTER — Telehealth: Payer: Self-pay | Admitting: Internal Medicine

## 2014-08-31 NOTE — Telephone Encounter (Signed)
See my note in lab result section  Is this pt still seeing her rheumatologist Dr. Georgiann Cocker in [place] who was giving her REclast?    Let her know she still shows osteoporosis in her hip.  If she wishes to see me to discuss schedule OV 30 min.  Ok to fax bone density report to her rheumatologist  Route back to me with pt response

## 2014-09-01 NOTE — Telephone Encounter (Signed)
I spoke with Mercedes Decker and she no longer received Reclast treatments. She is not on any treatment for the osteoporosis. She would like to see you for this. I made her a 30 min F/U next week- eh

## 2014-09-07 NOTE — Progress Notes (Signed)
Subjective:    Patient ID: Mercedes Decker, female    DOB: Mar 25, 1941, 74 y.o.   MRN: [id number]  HPI  10/28 HM Schedule 3D mm, S/P hysterectomy, Will need colonosocopy next year Counseled lung CA guideline. Pt quit 1975, 15 yr smoker less than 1 ppd. Pt has dermatologist that checksk  HTN Continue meds   Dysphagia /H pylori: She received ABx treatment will get barium swallow to assess for anatomic abnromality Will RX protonix 40 mg daily   Hyperlipidemia Labs good  DJd On diclofenac for 1 year Counseled regarding CV risk with long term use.   Osteoporosis Schedule DEXA She stopped Reclast approx 2 years ago. . Was prescribed by hematologist in [place].          TODAY  Mercedes Decker is here to follow up on her DEXA   T-score -3.1 left hip.  She had been receiving Reclast by Dr. Georgiann Cocker a hematologist with [facility]   Stopped REclast about 2 years ago  Dysphagia  She is not having any swallowing difficulty now.   Ba swallow showed no evidence of mass.  She has not seen the GI MD as yet   She is on a PPI  She does have cramping in hands and feet at times   Mercedes Decker is here to follow up on recent DEXA showing osteroporosis  See above.   Stopped REclast 2 years ago   Allergies  Allergen Reactions  . Ace Inhibitors Cough  . Losartan Other (See Comments)    Sweats, cramps  . Meloxicam     Too sleepy  . Vicodin [Hydrocodone-Acetaminophen] Other (See Comments)    Pt states that she stops breathing when she takes  Pain meds   Past Medical History  Diagnosis Date  . Hypertension   . Arthritis   . Migraine   . Osteoporosis    Past Surgical History  Procedure Laterality Date  . Abdominal hysterectomy    . Breast surgery    . Back surgery    . Knee arthroscopy    . Incisional hernia repair    . Bunionectomy     History   Social History  . Marital Status: Married    Spouse Name: N/A    Number of Children: N/A  . Years of Education: N/A   Occupational  History  . Not on file.   Social History Main Topics  . Smoking status: Never Smoker   . Smokeless tobacco: Never Used  . Alcohol Use: No  . Drug Use: No  . Sexual Activity: Yes    Birth Control/ Protection: Post-menopausal   Other Topics Concern  . Not on file   Social History Narrative   Family History  Problem Relation Age of Onset  . Alzheimer's disease Mother   . Heart disease Father   . Cancer Sister   . Diabetes Sister   . Heart disease Brother   . Diabetes Brother   . Cancer Brother    Patient Active Problem List   Diagnosis Date Noted  . Osteopenia 11/25/2012  . Murmur, cardiac 09/11/2012  . DJD (degenerative joint disease) 09/03/2012  . HTN (hypertension) 09/03/2012  . S/P hysterectomy 09/03/2012  . Osteoporosis  L fem neck  Dexa 07/2014  -3.1 09/03/2012  . Migraine 09/03/2012  . History of anemia 09/03/2012  . Hyperlipidemia 09/03/2012  . GERD (gastroesophageal reflux disease) 09/03/2012   Current Outpatient Prescriptions on File Prior to Visit  Medication Sig Dispense Refill  . amLODipine (NORVASC) 5 MG  tablet Take 1 tablet (5 mg total) by mouth daily. 90 tablet 1  . CALCIUM-MAGNESIUM-ZINC PO Take by mouth.    . cholecalciferol (VITAMIN D) 1000 UNITS tablet Take 2,000 Units by mouth daily.    . diclofenac (VOLTAREN) 75 MG EC tablet Take 75 mg by mouth daily.    Marland Kitchen losartan (COZAAR) 100 MG tablet Take 1 tablet (100 mg total) by mouth daily. 90 tablet 1  . pantoprazole (PROTONIX) 40 MG tablet Take 1 tablet (40 mg total) by mouth daily. 30 tablet 5  . pyridOXINE (VITAMIN B-6) 100 MG tablet Take 100 mg by mouth daily.    . SUMAtriptan 6 MG/0.5ML SOAJ Inject 1 Syringe into the skin once. Inject 6 mg into skin at onset of headache one time 1 Syringe 3  . vitamin C (ASCORBIC ACID) 500 MG tablet Take 500 mg by mouth daily.     No current facility-administered medications on file prior to visit.      Review of Systems See HPI    Objective:   Physical  Exam Physical Exam  Nursing note and vitals reviewed.  Constitutional: She is oriented to person, place, and time. She appears well-developed and well-nourished.  HENT:  Head: Normocephalic and atraumatic.  Cardiovascular: Normal rate and regular rhythm. Exam reveals no gallop and no friction rub.  No murmur heard.  Pulmonary/Chest: Breath sounds normal. She has no wheezes. She has no rales.  Neurological: She is alert and oriented to person, place, and time.  Skin: Skin is warm and dry.  Psychiatric: She has a normal mood and affect. Her behavior is normal.         Assessment & Plan:  Osteoporosis:   Pt given printed results of DEXA and she will call and make appt with Dr. Georgiann Cocker for follow up  Dyspahagia  Improved  Pt given a copy of BA Swallow and advised to make appt with [place] GI [place]  Muscle cramps  Will get Vitamin D and B12  She is taking OTC vitamin D and calcium

## 2014-09-08 ENCOUNTER — Encounter: Payer: Self-pay | Admitting: Internal Medicine

## 2014-09-08 ENCOUNTER — Ambulatory Visit (INDEPENDENT_AMBULATORY_CARE_PROVIDER_SITE_OTHER): Payer: Medicare Other | Admitting: Internal Medicine

## 2014-09-08 VITALS — BP 106/56 | HR 77 | Resp 18 | Ht <= 58 in | Wt 108.0 lb

## 2014-09-08 DIAGNOSIS — M810 Age-related osteoporosis without current pathological fracture: Secondary | ICD-10-CM | POA: Diagnosis not present

## 2014-09-08 DIAGNOSIS — R1314 Dysphagia, pharyngoesophageal phase: Secondary | ICD-10-CM | POA: Diagnosis not present

## 2014-09-08 NOTE — Patient Instructions (Signed)
Pt to make appt with GI MD and her hematologist  See me as needed

## 2014-10-06 DIAGNOSIS — H16221 Keratoconjunctivitis sicca, not specified as Sjogren's, right eye: Secondary | ICD-10-CM | POA: Diagnosis not present

## 2014-10-31 DIAGNOSIS — M189 Osteoarthritis of first carpometacarpal joint, unspecified: Secondary | ICD-10-CM | POA: Diagnosis not present

## 2014-10-31 DIAGNOSIS — M7072 Other bursitis of hip, left hip: Secondary | ICD-10-CM | POA: Diagnosis not present

## 2014-10-31 DIAGNOSIS — M25552 Pain in left hip: Secondary | ICD-10-CM | POA: Diagnosis not present

## 2014-12-08 DIAGNOSIS — M810 Age-related osteoporosis without current pathological fracture: Secondary | ICD-10-CM | POA: Diagnosis not present

## 2014-12-15 DIAGNOSIS — M810 Age-related osteoporosis without current pathological fracture: Secondary | ICD-10-CM | POA: Diagnosis not present

## 2014-12-25 ENCOUNTER — Ambulatory Visit (INDEPENDENT_AMBULATORY_CARE_PROVIDER_SITE_OTHER): Payer: Medicare Other | Admitting: Family Medicine

## 2014-12-25 ENCOUNTER — Encounter: Payer: Self-pay | Admitting: Family Medicine

## 2014-12-25 VITALS — BP 139/77 | HR 71 | Ht 58.75 in | Wt 111.0 lb

## 2014-12-25 DIAGNOSIS — I10 Essential (primary) hypertension: Secondary | ICD-10-CM | POA: Diagnosis not present

## 2014-12-25 DIAGNOSIS — K219 Gastro-esophageal reflux disease without esophagitis: Secondary | ICD-10-CM | POA: Diagnosis not present

## 2014-12-25 MED ORDER — METOPROLOL SUCCINATE ER 50 MG PO TB24
50.0000 mg | ORAL_TABLET | Freq: Every day | ORAL | Status: DC
Start: 2014-12-25 — End: 2015-01-21

## 2014-12-25 NOTE — Progress Notes (Signed)
CC: Mercedes Decker is a 74 y.o. female is here for Establish Care   Subjective: HPI:  Very pleasant 74 year old here to establish care  Reports a history of essential hypertension that has been present for at least 5 years. She doesn't believe that she is ever been on any medication other than an ACE inhibitor along time ago, losartan and amlodipine. When she began losartan a few years ago she developed a cough and one of her former providers recommended she stop the medication, interestingly the cough one away. Her former provider then decided to rechallenge her and when she started on losartan recently her cough returned. His cough is described as dry, worse after eating but can occur anytime of the day. She denies shortness of breath chest pain or orthopnea. She checks her blood pressure at home but doesn't have any values to report today.  Follow GERD: She had some dysphasia a little over a year ago and had a barium swallow study which was normal. She was also started on Protonix and since then she tells me that her dysphagia has vanished and she denies any epigastric pain abdominal pain or subjective reflux.   Review of Systems - General ROS: negative for - chills, fever, night sweats, weight gain or weight loss Ophthalmic ROS: negative for - decreased vision Psychological ROS: negative for - anxiety or depression ENT ROS: negative for - hearing change, nasal congestion, tinnitus or allergies Hematological and Lymphatic ROS: negative for - bleeding problems, bruising or swollen lymph nodes Breast ROS: negative Respiratory ROS: no shortness of breath, or wheezing Cardiovascular ROS: no chest pain or dyspnea on exertion Gastrointestinal ROS: no abdominal pain, change in bowel habits, or black or bloody stools Genito-Urinary ROS: negative for - genital discharge, genital ulcers, incontinence or abnormal bleeding from genitals Musculoskeletal ROS: negative for - joint pain or muscle  pain Neurological ROS: negative for - headaches or memory loss Dermatological ROS: negative for lumps, mole changes, rash and skin lesion changes  Past Medical History  Diagnosis Date  . Hypertension   . Arthritis   . Migraine   . Osteoporosis     Past Surgical History  Procedure Laterality Date  . Abdominal hysterectomy    . Breast surgery    . Back surgery    . Knee arthroscopy    . Incisional hernia repair    . Bunionectomy     Family History  Problem Relation Age of Onset  . Alzheimer's disease Mother   . Heart disease Father   . Cancer Sister   . Diabetes Sister   . Heart disease Brother   . Diabetes Brother   . Cancer Brother     History   Social History  . Marital Status: Married    Spouse Name: N/A  . Number of Children: N/A  . Years of Education: N/A   Occupational History  . Not on file.   Social History Main Topics  . Smoking status: Never Smoker   . Smokeless tobacco: Never Used  . Alcohol Use: No  . Drug Use: No  . Sexual Activity: Yes    Birth Control/ Protection: Post-menopausal   Other Topics Concern  . Not on file   Social History Narrative     Objective: BP 139/77 mmHg  Pulse 71  Ht 4' 10.75" (1.492 m)  Wt 111 lb (50.349 kg)  BMI 22.62 kg/m2  General: Alert and Oriented, No Acute Distress HEENT: Pupils equal, round, reactive to light. Conjunctivae clear. Moist  mucous membranes pharynx unremarkable  Lungs: Clear to auscultation bilaterally, no wheezing/ronchi/rales.  Comfortable work of breathing. Good air movement. Cardiac: Regular rate and rhythm. Normal S1/S2. Grade 1/6 holosystolic murmur. No rubs, nor gallops.  No carotid bruits  Extremities: No peripheral edema.  Strong peripheral pulses.  Mental Status: No depression, anxiety, nor agitation. Skin: Warm and dry.  Assessment & Plan: [name] was seen today for establish care.  Diagnoses and all orders for this visit:  Essential hypertension Orders: -     metoprolol  succinate (TOPROL-XL) 50 MG 24 hr tablet; Take 1 tablet (50 mg total) by mouth daily. Take with or immediately following a meal.  Gastroesophageal reflux disease without esophagitis   Essential hypertension: Uncontrolled chronic condition with possible side effect of medication. Stopping losartan, discussed that there is a low chance of this causing her cough but she would like to try the substitution to metoprolol instead. I've also given her a sheet to write down her daily blood pressures after she starts taking metoprolol and continues taking amlodipine, I'd like her to drop this off at the front desk next week so I can give her advice on that we need to titrate metoprolol. GERD: Controlled continue Protonix   Return in about 3 months (around 03/26/2015), or if symptoms worsen or fail to improve.

## 2014-12-25 NOTE — Patient Instructions (Signed)
Blood pressure check daily, write down and return in one week:                      .

## 2014-12-29 DIAGNOSIS — M810 Age-related osteoporosis without current pathological fracture: Secondary | ICD-10-CM | POA: Diagnosis not present

## 2014-12-29 DIAGNOSIS — R51 Headache: Secondary | ICD-10-CM | POA: Diagnosis not present

## 2014-12-29 DIAGNOSIS — R232 Flushing: Secondary | ICD-10-CM | POA: Diagnosis not present

## 2014-12-29 DIAGNOSIS — E785 Hyperlipidemia, unspecified: Secondary | ICD-10-CM | POA: Diagnosis not present

## 2014-12-29 DIAGNOSIS — I9589 Other hypotension: Secondary | ICD-10-CM | POA: Diagnosis not present

## 2014-12-29 DIAGNOSIS — Z79899 Other long term (current) drug therapy: Secondary | ICD-10-CM | POA: Diagnosis not present

## 2014-12-29 DIAGNOSIS — Z886 Allergy status to analgesic agent status: Secondary | ICD-10-CM | POA: Diagnosis not present

## 2014-12-29 DIAGNOSIS — I10 Essential (primary) hypertension: Secondary | ICD-10-CM | POA: Diagnosis not present

## 2014-12-29 DIAGNOSIS — G43909 Migraine, unspecified, not intractable, without status migrainosus: Secondary | ICD-10-CM | POA: Diagnosis not present

## 2014-12-29 DIAGNOSIS — D509 Iron deficiency anemia, unspecified: Secondary | ICD-10-CM | POA: Diagnosis not present

## 2014-12-29 DIAGNOSIS — Z87891 Personal history of nicotine dependence: Secondary | ICD-10-CM | POA: Diagnosis not present

## 2014-12-29 DIAGNOSIS — Z888 Allergy status to other drugs, medicaments and biological substances status: Secondary | ICD-10-CM | POA: Diagnosis not present

## 2014-12-29 DIAGNOSIS — G2581 Restless legs syndrome: Secondary | ICD-10-CM | POA: Diagnosis not present

## 2014-12-29 DIAGNOSIS — K219 Gastro-esophageal reflux disease without esophagitis: Secondary | ICD-10-CM | POA: Diagnosis not present

## 2014-12-29 DIAGNOSIS — M79641 Pain in right hand: Secondary | ICD-10-CM | POA: Diagnosis not present

## 2014-12-29 DIAGNOSIS — M654 Radial styloid tenosynovitis [de Quervain]: Secondary | ICD-10-CM | POA: Diagnosis not present

## 2014-12-29 DIAGNOSIS — Z885 Allergy status to narcotic agent status: Secondary | ICD-10-CM | POA: Diagnosis not present

## 2014-12-31 ENCOUNTER — Telehealth: Payer: Self-pay | Admitting: Family Medicine

## 2014-12-31 NOTE — Telephone Encounter (Signed)
Pt.notified

## 2014-12-31 NOTE — Telephone Encounter (Signed)
Seth Bake, Will you please let her know that her blood pressures look perfect.  I'd recommend continuing with metoprolol daily and f/u in July or sooner if headaches persist.

## 2015-01-21 ENCOUNTER — Encounter: Payer: Self-pay | Admitting: Family Medicine

## 2015-01-21 DIAGNOSIS — I10 Essential (primary) hypertension: Secondary | ICD-10-CM

## 2015-01-21 MED ORDER — AMLODIPINE BESYLATE 5 MG PO TABS: ORAL_TABLET | ORAL | Status: DC

## 2015-01-21 MED ORDER — METOPROLOL SUCCINATE ER 50 MG PO TB24
50.0000 mg | ORAL_TABLET | Freq: Every day | ORAL | Status: DC
Start: 2015-01-21 — End: 2015-04-20

## 2015-03-09 ENCOUNTER — Ambulatory Visit (INDEPENDENT_AMBULATORY_CARE_PROVIDER_SITE_OTHER): Payer: Medicare Other | Admitting: Family Medicine

## 2015-03-09 ENCOUNTER — Encounter: Payer: Self-pay | Admitting: Family Medicine

## 2015-03-09 VITALS — BP 133/77 | HR 79 | Temp 98.0°F | Wt 111.0 lb

## 2015-03-09 DIAGNOSIS — K219 Gastro-esophageal reflux disease without esophagitis: Secondary | ICD-10-CM

## 2015-03-09 DIAGNOSIS — T148 Other injury of unspecified body region: Secondary | ICD-10-CM

## 2015-03-09 DIAGNOSIS — IMO0002 Reserved for concepts with insufficient information to code with codable children: Secondary | ICD-10-CM

## 2015-03-09 DIAGNOSIS — I10 Essential (primary) hypertension: Secondary | ICD-10-CM

## 2015-03-09 MED ORDER — PANTOPRAZOLE SODIUM 40 MG PO TBEC: 40.0000 mg | DELAYED_RELEASE_TABLET | Freq: Every day | ORAL | Status: DC

## 2015-03-09 NOTE — Progress Notes (Signed)
CC: Mercedes Decker is a 74 y.o. female is here for Hypertension   Subjective: HPI:  Right shin abrasion that occurred after she ran into a piece of steel last week. Initially had some bleeding however no longer having any bleeding or discharge. She's been keeping the wound as dry as possible other than putting hydrocortisone cream on it. She believes it's healing nicely. It's painless. Denies any muscle rigidity, fevers, chills, nor skin changes elsewhere.  Follow-up GERD: Continue to take Protonix on a daily basis without any epigastric pain or reflux symptoms. Denies any known side effects  Follow-up essential hypertension: No outside blood pressures to report. She's been taking metoprolol and amlodipine on a daily basis without known side effects. Denies chest pain shortness of breath orthopnea or peripheral edema. No longer having a cough.   Review Of Systems Outlined In HPI  Past Medical History  Diagnosis Date  . Hypertension   . Arthritis   . Migraine   . Osteoporosis     Past Surgical History  Procedure Laterality Date  . Abdominal hysterectomy    . Breast surgery    . Back surgery    . Knee arthroscopy    . Incisional hernia repair    . Bunionectomy     Family History  Problem Relation Age of Onset  . Alzheimer's disease Mother   . Heart disease Father   . Cancer Sister   . Diabetes Sister   . Heart disease Brother   . Diabetes Brother   . Cancer Brother     History   Social History  . Marital Status: Married    Spouse Name: N/A  . Number of Children: N/A  . Years of Education: N/A   Occupational History  . Not on file.   Social History Main Topics  . Smoking status: Never Smoker   . Smokeless tobacco: Never Used  . Alcohol Use: No  . Drug Use: No  . Sexual Activity: Yes    Birth Control/ Protection: Post-menopausal   Other Topics Concern  . Not on file   Social History Narrative     Objective: BP 133/77 mmHg  Pulse 79  Temp(Src) 98 F  (36.7 C) (Oral)  Wt 111 lb (50.349 kg)  SpO2 99%  General: Alert and Oriented, No Acute Distress HEENT: Pupils equal, round, reactive to light. Conjunctivae clear.  Moist mucous membranes Lungs: Clear to auscultation bilaterally, no wheezing/ronchi/rales.  Comfortable work of breathing. Good air movement. Cardiac: Regular rate and rhythm. Normal S1/S2.  No murmurs, rubs, nor gallops.   Abdomen: Normal bowel sounds, soft and non tender without palpable masses. Extremities: No peripheral edema.  Strong peripheral pulses. On the right mid shin there is a 1 cm in length skin tear with a dog ear appearance that appears to be healing nicely. Mental Status: No depression, anxiety, nor agitation. Skin: Warm and dry.  Assessment & Plan: Areonna was seen today for hypertension.  Diagnoses and all orders for this visit:  Essential hypertension  Gastroesophageal reflux disease without esophagitis  Skin laceration  Other orders -     pantoprazole (PROTONIX) 40 MG tablet; Take 1 tablet (40 mg total) by mouth daily.   Essential hypertension: Controlled continue amlodipine and metoprolol GERD: Controlled continue Protonix Skin tear/laceration: No sign of infection, advised to avoid putting cortisone cream on this because it could delay the healing process. Continue to keep covered and clean.  Return in about 6 months (around 09/09/2015).

## 2015-03-13 ENCOUNTER — Encounter: Payer: Self-pay | Admitting: Family Medicine

## 2015-04-09 DIAGNOSIS — Z961 Presence of intraocular lens: Secondary | ICD-10-CM | POA: Diagnosis not present

## 2015-04-09 DIAGNOSIS — H16223 Keratoconjunctivitis sicca, not specified as Sjogren's, bilateral: Secondary | ICD-10-CM | POA: Diagnosis not present

## 2015-04-09 DIAGNOSIS — H5203 Hypermetropia, bilateral: Secondary | ICD-10-CM | POA: Diagnosis not present

## 2015-04-09 DIAGNOSIS — Z83511 Family history of glaucoma: Secondary | ICD-10-CM | POA: Diagnosis not present

## 2015-04-09 DIAGNOSIS — H2512 Age-related nuclear cataract, left eye: Secondary | ICD-10-CM | POA: Diagnosis not present

## 2015-04-16 DIAGNOSIS — M79642 Pain in left hand: Secondary | ICD-10-CM | POA: Diagnosis not present

## 2015-04-16 DIAGNOSIS — M810 Age-related osteoporosis without current pathological fracture: Secondary | ICD-10-CM | POA: Diagnosis not present

## 2015-04-16 DIAGNOSIS — M79641 Pain in right hand: Secondary | ICD-10-CM | POA: Diagnosis not present

## 2015-04-16 DIAGNOSIS — M1990 Unspecified osteoarthritis, unspecified site: Secondary | ICD-10-CM | POA: Diagnosis not present

## 2015-04-20 ENCOUNTER — Other Ambulatory Visit: Payer: Self-pay | Admitting: Family Medicine

## 2015-04-20 DIAGNOSIS — M189 Osteoarthritis of first carpometacarpal joint, unspecified: Secondary | ICD-10-CM | POA: Diagnosis not present

## 2015-04-20 DIAGNOSIS — M7072 Other bursitis of hip, left hip: Secondary | ICD-10-CM | POA: Diagnosis not present

## 2015-04-20 DIAGNOSIS — M25532 Pain in left wrist: Secondary | ICD-10-CM | POA: Diagnosis not present

## 2015-04-20 DIAGNOSIS — M654 Radial styloid tenosynovitis [de Quervain]: Secondary | ICD-10-CM | POA: Diagnosis not present

## 2015-04-22 DIAGNOSIS — D2239 Melanocytic nevi of other parts of face: Secondary | ICD-10-CM | POA: Diagnosis not present

## 2015-04-22 DIAGNOSIS — Z809 Family history of malignant neoplasm, unspecified: Secondary | ICD-10-CM | POA: Diagnosis not present

## 2015-04-22 DIAGNOSIS — D485 Neoplasm of uncertain behavior of skin: Secondary | ICD-10-CM | POA: Diagnosis not present

## 2015-04-22 DIAGNOSIS — L820 Inflamed seborrheic keratosis: Secondary | ICD-10-CM | POA: Diagnosis not present

## 2015-04-27 ENCOUNTER — Other Ambulatory Visit: Payer: Self-pay | Admitting: Family Medicine

## 2015-05-01 DIAGNOSIS — Z23 Encounter for immunization: Secondary | ICD-10-CM | POA: Diagnosis not present

## 2015-06-04 DIAGNOSIS — I10 Essential (primary) hypertension: Secondary | ICD-10-CM | POA: Diagnosis not present

## 2015-06-04 DIAGNOSIS — Z7983 Long term (current) use of bisphosphonates: Secondary | ICD-10-CM | POA: Diagnosis not present

## 2015-06-04 DIAGNOSIS — Z791 Long term (current) use of non-steroidal anti-inflammatories (NSAID): Secondary | ICD-10-CM | POA: Diagnosis not present

## 2015-06-04 DIAGNOSIS — Z888 Allergy status to other drugs, medicaments and biological substances status: Secondary | ICD-10-CM | POA: Diagnosis not present

## 2015-06-04 DIAGNOSIS — M654 Radial styloid tenosynovitis [de Quervain]: Secondary | ICD-10-CM | POA: Diagnosis not present

## 2015-06-04 DIAGNOSIS — M19032 Primary osteoarthritis, left wrist: Secondary | ICD-10-CM | POA: Diagnosis not present

## 2015-06-04 DIAGNOSIS — Z79899 Other long term (current) drug therapy: Secondary | ICD-10-CM | POA: Diagnosis not present

## 2015-06-04 DIAGNOSIS — M810 Age-related osteoporosis without current pathological fracture: Secondary | ICD-10-CM | POA: Diagnosis not present

## 2015-06-04 DIAGNOSIS — M1812 Unilateral primary osteoarthritis of first carpometacarpal joint, left hand: Secondary | ICD-10-CM | POA: Diagnosis not present

## 2015-06-04 DIAGNOSIS — G8918 Other acute postprocedural pain: Secondary | ICD-10-CM | POA: Diagnosis not present

## 2015-06-04 DIAGNOSIS — Z9109 Other allergy status, other than to drugs and biological substances: Secondary | ICD-10-CM | POA: Diagnosis not present

## 2015-06-04 DIAGNOSIS — M25532 Pain in left wrist: Secondary | ICD-10-CM | POA: Diagnosis not present

## 2015-06-04 DIAGNOSIS — Z87891 Personal history of nicotine dependence: Secondary | ICD-10-CM | POA: Diagnosis not present

## 2015-06-04 DIAGNOSIS — E785 Hyperlipidemia, unspecified: Secondary | ICD-10-CM | POA: Diagnosis not present

## 2015-06-11 DIAGNOSIS — Z9889 Other specified postprocedural states: Secondary | ICD-10-CM | POA: Diagnosis not present

## 2015-06-11 DIAGNOSIS — M654 Radial styloid tenosynovitis [de Quervain]: Secondary | ICD-10-CM | POA: Diagnosis not present

## 2015-07-15 DIAGNOSIS — M810 Age-related osteoporosis without current pathological fracture: Secondary | ICD-10-CM | POA: Diagnosis not present

## 2015-07-21 DIAGNOSIS — M1812 Unilateral primary osteoarthritis of first carpometacarpal joint, left hand: Secondary | ICD-10-CM | POA: Diagnosis not present

## 2015-07-21 DIAGNOSIS — Z4789 Encounter for other orthopedic aftercare: Secondary | ICD-10-CM | POA: Diagnosis not present

## 2015-07-21 DIAGNOSIS — I10 Essential (primary) hypertension: Secondary | ICD-10-CM | POA: Diagnosis not present

## 2015-07-21 DIAGNOSIS — M654 Radial styloid tenosynovitis [de Quervain]: Secondary | ICD-10-CM | POA: Diagnosis not present

## 2015-07-21 DIAGNOSIS — K219 Gastro-esophageal reflux disease without esophagitis: Secondary | ICD-10-CM | POA: Diagnosis not present

## 2015-07-21 DIAGNOSIS — M810 Age-related osteoporosis without current pathological fracture: Secondary | ICD-10-CM | POA: Diagnosis not present

## 2015-07-27 DIAGNOSIS — M654 Radial styloid tenosynovitis [de Quervain]: Secondary | ICD-10-CM | POA: Diagnosis not present

## 2015-07-27 DIAGNOSIS — M1812 Unilateral primary osteoarthritis of first carpometacarpal joint, left hand: Secondary | ICD-10-CM | POA: Diagnosis not present

## 2015-07-27 DIAGNOSIS — K219 Gastro-esophageal reflux disease without esophagitis: Secondary | ICD-10-CM | POA: Diagnosis not present

## 2015-07-27 DIAGNOSIS — I10 Essential (primary) hypertension: Secondary | ICD-10-CM | POA: Diagnosis not present

## 2015-07-27 DIAGNOSIS — Z4789 Encounter for other orthopedic aftercare: Secondary | ICD-10-CM | POA: Diagnosis not present

## 2015-07-27 DIAGNOSIS — M810 Age-related osteoporosis without current pathological fracture: Secondary | ICD-10-CM | POA: Diagnosis not present

## 2015-07-30 DIAGNOSIS — M1812 Unilateral primary osteoarthritis of first carpometacarpal joint, left hand: Secondary | ICD-10-CM | POA: Diagnosis not present

## 2015-07-30 DIAGNOSIS — Z4789 Encounter for other orthopedic aftercare: Secondary | ICD-10-CM | POA: Diagnosis not present

## 2015-07-30 DIAGNOSIS — I10 Essential (primary) hypertension: Secondary | ICD-10-CM | POA: Diagnosis not present

## 2015-07-30 DIAGNOSIS — M810 Age-related osteoporosis without current pathological fracture: Secondary | ICD-10-CM | POA: Diagnosis not present

## 2015-07-30 DIAGNOSIS — K219 Gastro-esophageal reflux disease without esophagitis: Secondary | ICD-10-CM | POA: Diagnosis not present

## 2015-07-30 DIAGNOSIS — M654 Radial styloid tenosynovitis [de Quervain]: Secondary | ICD-10-CM | POA: Diagnosis not present

## 2015-08-03 DIAGNOSIS — M1812 Unilateral primary osteoarthritis of first carpometacarpal joint, left hand: Secondary | ICD-10-CM | POA: Diagnosis not present

## 2015-08-03 DIAGNOSIS — K219 Gastro-esophageal reflux disease without esophagitis: Secondary | ICD-10-CM | POA: Diagnosis not present

## 2015-08-03 DIAGNOSIS — I10 Essential (primary) hypertension: Secondary | ICD-10-CM | POA: Diagnosis not present

## 2015-08-03 DIAGNOSIS — M810 Age-related osteoporosis without current pathological fracture: Secondary | ICD-10-CM | POA: Diagnosis not present

## 2015-08-03 DIAGNOSIS — Z4789 Encounter for other orthopedic aftercare: Secondary | ICD-10-CM | POA: Diagnosis not present

## 2015-08-03 DIAGNOSIS — M654 Radial styloid tenosynovitis [de Quervain]: Secondary | ICD-10-CM | POA: Diagnosis not present

## 2015-08-06 DIAGNOSIS — M1812 Unilateral primary osteoarthritis of first carpometacarpal joint, left hand: Secondary | ICD-10-CM | POA: Diagnosis not present

## 2015-08-06 DIAGNOSIS — M810 Age-related osteoporosis without current pathological fracture: Secondary | ICD-10-CM | POA: Diagnosis not present

## 2015-08-06 DIAGNOSIS — M654 Radial styloid tenosynovitis [de Quervain]: Secondary | ICD-10-CM | POA: Diagnosis not present

## 2015-08-06 DIAGNOSIS — K219 Gastro-esophageal reflux disease without esophagitis: Secondary | ICD-10-CM | POA: Diagnosis not present

## 2015-08-06 DIAGNOSIS — I10 Essential (primary) hypertension: Secondary | ICD-10-CM | POA: Diagnosis not present

## 2015-08-06 DIAGNOSIS — Z4789 Encounter for other orthopedic aftercare: Secondary | ICD-10-CM | POA: Diagnosis not present

## 2015-08-10 DIAGNOSIS — M810 Age-related osteoporosis without current pathological fracture: Secondary | ICD-10-CM | POA: Diagnosis not present

## 2015-08-10 DIAGNOSIS — M1812 Unilateral primary osteoarthritis of first carpometacarpal joint, left hand: Secondary | ICD-10-CM | POA: Diagnosis not present

## 2015-08-10 DIAGNOSIS — K219 Gastro-esophageal reflux disease without esophagitis: Secondary | ICD-10-CM | POA: Diagnosis not present

## 2015-08-10 DIAGNOSIS — M654 Radial styloid tenosynovitis [de Quervain]: Secondary | ICD-10-CM | POA: Diagnosis not present

## 2015-08-10 DIAGNOSIS — I10 Essential (primary) hypertension: Secondary | ICD-10-CM | POA: Diagnosis not present

## 2015-08-10 DIAGNOSIS — Z4789 Encounter for other orthopedic aftercare: Secondary | ICD-10-CM | POA: Diagnosis not present

## 2015-08-13 DIAGNOSIS — K219 Gastro-esophageal reflux disease without esophagitis: Secondary | ICD-10-CM | POA: Diagnosis not present

## 2015-08-13 DIAGNOSIS — M654 Radial styloid tenosynovitis [de Quervain]: Secondary | ICD-10-CM | POA: Diagnosis not present

## 2015-08-13 DIAGNOSIS — Z4789 Encounter for other orthopedic aftercare: Secondary | ICD-10-CM | POA: Diagnosis not present

## 2015-08-13 DIAGNOSIS — M1812 Unilateral primary osteoarthritis of first carpometacarpal joint, left hand: Secondary | ICD-10-CM | POA: Diagnosis not present

## 2015-08-13 DIAGNOSIS — M810 Age-related osteoporosis without current pathological fracture: Secondary | ICD-10-CM | POA: Diagnosis not present

## 2015-08-13 DIAGNOSIS — I10 Essential (primary) hypertension: Secondary | ICD-10-CM | POA: Diagnosis not present

## 2015-08-17 DIAGNOSIS — K219 Gastro-esophageal reflux disease without esophagitis: Secondary | ICD-10-CM | POA: Diagnosis not present

## 2015-08-17 DIAGNOSIS — Z4789 Encounter for other orthopedic aftercare: Secondary | ICD-10-CM | POA: Diagnosis not present

## 2015-08-17 DIAGNOSIS — I10 Essential (primary) hypertension: Secondary | ICD-10-CM | POA: Diagnosis not present

## 2015-08-17 DIAGNOSIS — M654 Radial styloid tenosynovitis [de Quervain]: Secondary | ICD-10-CM | POA: Diagnosis not present

## 2015-08-17 DIAGNOSIS — M1812 Unilateral primary osteoarthritis of first carpometacarpal joint, left hand: Secondary | ICD-10-CM | POA: Diagnosis not present

## 2015-08-17 DIAGNOSIS — M810 Age-related osteoporosis without current pathological fracture: Secondary | ICD-10-CM | POA: Diagnosis not present

## 2015-08-20 DIAGNOSIS — M654 Radial styloid tenosynovitis [de Quervain]: Secondary | ICD-10-CM | POA: Diagnosis not present

## 2015-08-20 DIAGNOSIS — M1812 Unilateral primary osteoarthritis of first carpometacarpal joint, left hand: Secondary | ICD-10-CM | POA: Diagnosis not present

## 2015-08-20 DIAGNOSIS — I10 Essential (primary) hypertension: Secondary | ICD-10-CM | POA: Diagnosis not present

## 2015-08-20 DIAGNOSIS — Z4789 Encounter for other orthopedic aftercare: Secondary | ICD-10-CM | POA: Diagnosis not present

## 2015-08-20 DIAGNOSIS — M810 Age-related osteoporosis without current pathological fracture: Secondary | ICD-10-CM | POA: Diagnosis not present

## 2015-08-20 DIAGNOSIS — K219 Gastro-esophageal reflux disease without esophagitis: Secondary | ICD-10-CM | POA: Diagnosis not present

## 2015-08-27 DIAGNOSIS — M810 Age-related osteoporosis without current pathological fracture: Secondary | ICD-10-CM | POA: Diagnosis not present

## 2015-08-27 DIAGNOSIS — Z4789 Encounter for other orthopedic aftercare: Secondary | ICD-10-CM | POA: Diagnosis not present

## 2015-08-27 DIAGNOSIS — K219 Gastro-esophageal reflux disease without esophagitis: Secondary | ICD-10-CM | POA: Diagnosis not present

## 2015-08-27 DIAGNOSIS — M654 Radial styloid tenosynovitis [de Quervain]: Secondary | ICD-10-CM | POA: Diagnosis not present

## 2015-08-27 DIAGNOSIS — M1812 Unilateral primary osteoarthritis of first carpometacarpal joint, left hand: Secondary | ICD-10-CM | POA: Diagnosis not present

## 2015-08-27 DIAGNOSIS — I10 Essential (primary) hypertension: Secondary | ICD-10-CM | POA: Diagnosis not present

## 2015-09-02 DIAGNOSIS — M654 Radial styloid tenosynovitis [de Quervain]: Secondary | ICD-10-CM | POA: Diagnosis not present

## 2015-09-02 DIAGNOSIS — M1812 Unilateral primary osteoarthritis of first carpometacarpal joint, left hand: Secondary | ICD-10-CM | POA: Diagnosis not present

## 2015-09-09 ENCOUNTER — Ambulatory Visit (INDEPENDENT_AMBULATORY_CARE_PROVIDER_SITE_OTHER): Payer: Medicare Other | Admitting: Family Medicine

## 2015-09-09 ENCOUNTER — Encounter: Payer: Self-pay | Admitting: Family Medicine

## 2015-09-09 VITALS — BP 139/76 | HR 72 | Wt 111.0 lb

## 2015-09-09 DIAGNOSIS — K219 Gastro-esophageal reflux disease without esophagitis: Secondary | ICD-10-CM

## 2015-09-09 DIAGNOSIS — I10 Essential (primary) hypertension: Secondary | ICD-10-CM

## 2015-09-09 DIAGNOSIS — E785 Hyperlipidemia, unspecified: Secondary | ICD-10-CM | POA: Diagnosis not present

## 2015-09-09 LAB — LIPID PANEL
CHOLESTEROL: 241 mg/dL — AB (ref 125–200)
HDL: 74 mg/dL (ref >=46)
LDL Cholesterol: 145 mg/dL — ABNORMAL HIGH (ref <130)
TRIGLYCERIDES: 112 mg/dL (ref <150)
Total CHOL/HDL Ratio: 3.3 Ratio (ref <=5.0)
VLDL: 22 mg/dL (ref <30)

## 2015-09-09 MED ORDER — METOPROLOL SUCCINATE ER 50 MG PO TB24: ORAL_TABLET | ORAL | Status: DC

## 2015-09-09 MED ORDER — AMLODIPINE BESYLATE 5 MG PO TABS: ORAL_TABLET | ORAL | Status: DC

## 2015-09-09 MED ORDER — CIPROFLOXACIN HCL 500 MG PO TABS: 500.0000 mg | ORAL_TABLET | Freq: Two times a day (BID) | ORAL | Status: DC

## 2015-09-09 NOTE — Progress Notes (Signed)
CC: Mercedes Decker is a 75 y.o. female is here for Hypertension   Subjective: HPI:  Follow-up essential hypertension: Checking blood pressure at home couple times a week and every reading is below 140/90. Denies chest pain shortness of breath orthopnea or peripheral edema. No known side effects from metoprolol or amlodipine.  Follow GERD: She never needed to start taking Protonix because she did not have any return of her epigastric pain or reflux symptoms. She denies constipation or diarrhea.  Hyperlipidemia: She had a mildly elevated LDL year and a half ago. There has not been any repeat lipid panel since then in our office or any other office. She tries to stay active and watch what she eats. She's never been on cholesterol-lowering medication. She denies limb claudication or right upper quadrant pain.  She'll be going to [place] later this week and in past trip she suffered from traveler's diarrhea.   Review Of Systems Outlined In HPI  Past Medical History  Diagnosis Date  . Hypertension   . Arthritis   . Migraine   . Osteoporosis     Past Surgical History  Procedure Laterality Date  . Abdominal hysterectomy    . Breast surgery    . Back surgery    . Knee arthroscopy    . Incisional hernia repair    . Bunionectomy     Family History  Problem Relation Age of Onset  . Alzheimer's disease Mother   . Heart disease Father   . Cancer Sister   . Diabetes Sister   . Heart disease Brother   . Diabetes Brother   . Cancer Brother     Social History   Social History  . Marital Status: Married    Spouse Name: N/A  . Number of Children: N/A  . Years of Education: N/A   Occupational History  . Not on file.   Social History Main Topics  . Smoking status: Never Smoker   . Smokeless tobacco: Never Used  . Alcohol Use: No  . Drug Use: No  . Sexual Activity: Yes    Birth Control/ Protection: Post-menopausal   Other Topics Concern  . Not on file   Social History  Narrative     Objective: BP 139/76 mmHg  Pulse 72  Wt 111 lb (50.349 kg)  Vital signs reviewed. General: Alert and Oriented, No Acute Distress HEENT: Pupils equal, round, reactive to light. Conjunctivae clear.  External ears unremarkable.  Moist mucous membranes. Lungs: Clear and comfortable work of breathing, speaking in full sentences without accessory muscle use. Cardiac: Regular rate and rhythm.  Neuro: CN II-XII grossly intact, gait normal. Extremities: No peripheral edema.  Strong peripheral pulses.  Mental Status: No depression, anxiety, nor agitation. Logical though process. Skin: Warm and dry. Assessment & Plan: Mercedes Decker was seen today for hypertension.  Diagnoses and all orders for this visit:  Essential hypertension -     metoprolol succinate (TOPROL-XL) 50 MG 24 hr tablet; Take 1 tablet (50 mg total) by mouth daily. Take with or immediately following a meal. -     amLODipine (NORVASC) 5 MG tablet; Take 1 tablet (5 mg total) by mouth daily.  Gastroesophageal reflux disease without esophagitis  Hyperlipidemia -     Lipid panel  Other orders -     ciprofloxacin (CIPRO) 500 MG tablet; Take 1 tablet (500 mg total) by mouth 2 (two) times daily. Only if needed.    essential hypertension: Controlled continue metoprolol and amlodipine  GERD: Controlled, she  will hold off on restarting Protonix, she has a 90 day supply on hand in case she gets any return of her reflux symptoms she will restart another month of this medication.  Hyperlipidemia: Overdue for repeat lipid panel.  I've given her a 10 day regimen of ciprofloxacin should she get traveler's diarrhea again on her upcoming trip.  She tells me that ever since she started taking Mercedes Decker she's noticed that her upper partial seems somewhat loose. She denies any jaw pain. I've encouraged her to think about switching to a different medication for osteoporosis such as a SERM, she'll discuss it with Dr. Georgiann Cocker  first.  Return in about 6 months (around 03/08/2016).

## 2015-09-10 ENCOUNTER — Telehealth: Payer: Self-pay | Admitting: Family Medicine

## 2015-09-10 DIAGNOSIS — M1812 Unilateral primary osteoarthritis of first carpometacarpal joint, left hand: Secondary | ICD-10-CM | POA: Diagnosis not present

## 2015-09-10 DIAGNOSIS — M654 Radial styloid tenosynovitis [de Quervain]: Secondary | ICD-10-CM | POA: Diagnosis not present

## 2015-09-10 NOTE — Telephone Encounter (Signed)
vm left for pt to return call

## 2015-09-10 NOTE — Telephone Encounter (Signed)
Will you please let patient know that her LDL cholesterol has significantly elevated compared to last year and is now well into the abnormal range.  This can be improved with engaging in 30-45 minutes of moderate exercise most days of the week. I'd recommend returning in 6 months to recheck this.

## 2015-09-11 NOTE — Telephone Encounter (Signed)
Pt.notified

## 2015-09-21 DIAGNOSIS — M654 Radial styloid tenosynovitis [de Quervain]: Secondary | ICD-10-CM | POA: Diagnosis not present

## 2015-09-21 DIAGNOSIS — M1812 Unilateral primary osteoarthritis of first carpometacarpal joint, left hand: Secondary | ICD-10-CM | POA: Diagnosis not present

## 2015-09-28 DIAGNOSIS — M7072 Other bursitis of hip, left hip: Secondary | ICD-10-CM | POA: Diagnosis not present

## 2015-09-28 DIAGNOSIS — M25552 Pain in left hip: Secondary | ICD-10-CM | POA: Diagnosis not present

## 2015-10-14 DIAGNOSIS — M4316 Spondylolisthesis, lumbar region: Secondary | ICD-10-CM | POA: Diagnosis not present

## 2015-10-14 DIAGNOSIS — M25552 Pain in left hip: Secondary | ICD-10-CM | POA: Diagnosis not present

## 2015-10-14 DIAGNOSIS — M5125 Other intervertebral disc displacement, thoracolumbar region: Secondary | ICD-10-CM | POA: Diagnosis not present

## 2015-10-21 DIAGNOSIS — M2550 Pain in unspecified joint: Secondary | ICD-10-CM | POA: Diagnosis not present

## 2015-10-21 DIAGNOSIS — M810 Age-related osteoporosis without current pathological fracture: Secondary | ICD-10-CM | POA: Diagnosis not present

## 2015-10-21 DIAGNOSIS — Z79899 Other long term (current) drug therapy: Secondary | ICD-10-CM | POA: Diagnosis not present

## 2015-10-21 DIAGNOSIS — Z5181 Encounter for therapeutic drug level monitoring: Secondary | ICD-10-CM | POA: Diagnosis not present

## 2015-10-21 LAB — CBC AND DIFFERENTIAL
Hemoglobin: 12.6 g/dL (ref 12.0–16.0)
Platelets: 227 10*3/uL (ref 150–399)
WBC: 5.8 10^3/mL

## 2015-10-21 LAB — HEPATIC FUNCTION PANEL
ALT: 25 U/L (ref 7–35)
AST: 23 U/L (ref 13–35)

## 2015-10-21 LAB — BASIC METABOLIC PANEL
Creatinine: 0.8 mg/dL (ref <=1.1)
GLUCOSE: 92 mg/dL
Potassium: 4.9 mmol/L (ref 3.4–5.3)
SODIUM: 142 mmol/L (ref 137–147)

## 2015-10-21 LAB — COMPREHENSIVE METABOLIC PANEL: CALCIUM: 9.1 mg/dL

## 2015-10-29 ENCOUNTER — Encounter: Payer: Self-pay | Admitting: Family Medicine

## 2015-11-06 ENCOUNTER — Encounter: Payer: Self-pay | Admitting: *Deleted

## 2015-11-19 DIAGNOSIS — M47816 Spondylosis without myelopathy or radiculopathy, lumbar region: Secondary | ICD-10-CM | POA: Diagnosis not present

## 2015-11-19 DIAGNOSIS — M549 Dorsalgia, unspecified: Secondary | ICD-10-CM | POA: Diagnosis not present

## 2015-11-19 DIAGNOSIS — G959 Disease of spinal cord, unspecified: Secondary | ICD-10-CM | POA: Diagnosis not present

## 2015-11-19 DIAGNOSIS — M961 Postlaminectomy syndrome, not elsewhere classified: Secondary | ICD-10-CM | POA: Diagnosis not present

## 2015-11-25 DIAGNOSIS — M5417 Radiculopathy, lumbosacral region: Secondary | ICD-10-CM | POA: Diagnosis not present

## 2015-11-30 ENCOUNTER — Encounter: Payer: Self-pay | Admitting: Family Medicine

## 2015-11-30 ENCOUNTER — Ambulatory Visit (INDEPENDENT_AMBULATORY_CARE_PROVIDER_SITE_OTHER): Payer: Medicare Other | Admitting: Family Medicine

## 2015-11-30 VITALS — BP 128/78 | HR 64 | Wt 110.0 lb

## 2015-11-30 DIAGNOSIS — Z789 Other specified health status: Secondary | ICD-10-CM | POA: Insufficient documentation

## 2015-11-30 DIAGNOSIS — G459 Transient cerebral ischemic attack, unspecified: Secondary | ICD-10-CM | POA: Diagnosis not present

## 2015-11-30 MED ORDER — ASPIRIN EC 81 MG PO TBEC: 81.0000 mg | DELAYED_RELEASE_TABLET | Freq: Every day | ORAL | Status: DC

## 2015-11-30 NOTE — Progress Notes (Signed)
CC: Mercedes Decker is a 75 y.o. female is here for Blurred Vision   Subjective: HPI:  4 episodes of bilateral blurred vision that lasts anywhere from 5 seconds to 5 minutes. Symptoms occur on a monthly basis and have been happening since December. Nothing seems to make them better or worse there's a sudden onset and sudden resolution without any intervention. She denies any other complaints during these episodes. She denies any other motor or sensory disturbances recently or remotely. Occasionally has a headache in the forehead but no other sense of discomfort. Denies irregular heartbeat, chest pain, nor hearing loss. No slurred speech or mental disturbance. She denies any cognitive changes. No coordination difficulty. No shortness of breath or limb claudication   Review Of Systems Outlined In HPI  Past Medical History  Diagnosis Date  . Hypertension   . Arthritis   . Migraine   . Osteoporosis     Past Surgical History  Procedure Laterality Date  . Abdominal hysterectomy    . Breast surgery    . Back surgery    . Knee arthroscopy    . Incisional hernia repair    . Bunionectomy     Family History  Problem Relation Age of Onset  . Alzheimer's disease Mother   . Heart disease Father   . Cancer Sister   . Diabetes Sister   . Heart disease Brother   . Diabetes Brother   . Cancer Brother     Social History   Social History  . Marital Status: Married    Spouse Name: N/A  . Number of Children: N/A  . Years of Education: N/A   Occupational History  . Not on file.   Social History Main Topics  . Smoking status: Never Smoker   . Smokeless tobacco: Never Used  . Alcohol Use: No  . Drug Use: No  . Sexual Activity: Yes    Birth Control/ Protection: Post-menopausal   Other Topics Concern  . Not on file   Social History Narrative     Objective: BP 128/78 mmHg  Pulse 64  Wt 110 lb (49.896 kg)  General: Alert and Oriented, No Acute Distress HEENT: Pupils equal,  round, reactive to light. Conjunctivae clear. Moist mucous membranes Lungs: Clear to auscultation bilaterally, no wheezing/ronchi/rales.  Comfortable work of breathing. Good air movement. Cardiac: Regular rate and rhythm. Normal S1/S2.  No murmurs, rubs, nor gallops.  No carotid bruit Neuro: CN II-XII grossly intact, full strength/rom of all four extremities, gait normal, rapid alternating movements normal, heel-shin test normal, Rhomberg normal. Extremities: No peripheral edema.  Strong peripheral pulses.  Mental Status: No depression, anxiety, nor agitation. Skin: Warm and dry.  Assessment & Plan: Mercedes Decker was seen today for blurred vision.  Diagnoses and all orders for this visit:  Transient cerebral ischemia, unspecified transient cerebral ischemia type -     Echocardiogram; Future  Patient has active power of attorney for health care  Other orders -     aspirin EC 81 MG tablet; Take 1 tablet (81 mg total) by mouth daily.   Possible ocular migraine however cannot rule out TIA therefore start a baby aspirin daily and check an echocardiogram.Signs and symptoms requring emergent/urgent reevaluation were discussed with the patient. We'll continue to optimize blood pressure and blood sugar. Will need to return once fasting to check LDL cholesterol.  Return if symptoms worsen or fail to improve.

## 2015-12-02 ENCOUNTER — Ambulatory Visit (HOSPITAL_COMMUNITY)
Admission: RE | Admit: 2015-12-02 | Discharge: 2015-12-02 | Disposition: A | Discharge status: Home / Self Care | Payer: Medicare Other | Source: Ambulatory Visit | Attending: Family Medicine | Admitting: Family Medicine

## 2015-12-02 DIAGNOSIS — G459 Transient cerebral ischemic attack, unspecified: Secondary | ICD-10-CM | POA: Diagnosis not present

## 2015-12-02 DIAGNOSIS — E785 Hyperlipidemia, unspecified: Secondary | ICD-10-CM | POA: Insufficient documentation

## 2015-12-02 DIAGNOSIS — I10 Essential (primary) hypertension: Secondary | ICD-10-CM | POA: Insufficient documentation

## 2015-12-02 DIAGNOSIS — M6281 Muscle weakness (generalized): Secondary | ICD-10-CM | POA: Diagnosis not present

## 2015-12-02 DIAGNOSIS — I081 Rheumatic disorders of both mitral and tricuspid valves: Secondary | ICD-10-CM | POA: Diagnosis not present

## 2015-12-02 DIAGNOSIS — M2560 Stiffness of unspecified joint, not elsewhere classified: Secondary | ICD-10-CM | POA: Diagnosis not present

## 2015-12-02 DIAGNOSIS — M549 Dorsalgia, unspecified: Secondary | ICD-10-CM | POA: Diagnosis not present

## 2015-12-02 NOTE — Progress Notes (Signed)
Echocardiogram 2D Echocardiogram has been performed.  Mercedes Decker 12/02/2015, 10:10 AM

## 2015-12-15 DIAGNOSIS — M6281 Muscle weakness (generalized): Secondary | ICD-10-CM | POA: Diagnosis not present

## 2015-12-15 DIAGNOSIS — M549 Dorsalgia, unspecified: Secondary | ICD-10-CM | POA: Diagnosis not present

## 2015-12-23 ENCOUNTER — Other Ambulatory Visit (HOSPITAL_BASED_OUTPATIENT_CLINIC_OR_DEPARTMENT_OTHER): Payer: PRIVATE HEALTH INSURANCE

## 2015-12-24 DIAGNOSIS — M549 Dorsalgia, unspecified: Secondary | ICD-10-CM | POA: Diagnosis not present

## 2016-01-18 ENCOUNTER — Other Ambulatory Visit: Payer: Self-pay | Admitting: Family Medicine

## 2016-03-04 DIAGNOSIS — M19049 Primary osteoarthritis, unspecified hand: Secondary | ICD-10-CM | POA: Diagnosis not present

## 2016-03-04 DIAGNOSIS — M79641 Pain in right hand: Secondary | ICD-10-CM | POA: Diagnosis not present

## 2016-03-04 DIAGNOSIS — M79642 Pain in left hand: Secondary | ICD-10-CM | POA: Diagnosis not present

## 2016-03-04 DIAGNOSIS — M25532 Pain in left wrist: Secondary | ICD-10-CM | POA: Diagnosis not present

## 2016-03-07 ENCOUNTER — Encounter: Payer: Self-pay | Admitting: Family Medicine

## 2016-03-07 DIAGNOSIS — M1811 Unilateral primary osteoarthritis of first carpometacarpal joint, right hand: Secondary | ICD-10-CM | POA: Insufficient documentation

## 2016-03-07 DIAGNOSIS — M79641 Pain in right hand: Secondary | ICD-10-CM | POA: Insufficient documentation

## 2016-03-08 ENCOUNTER — Encounter: Payer: Self-pay | Admitting: Family Medicine

## 2016-03-08 ENCOUNTER — Ambulatory Visit (INDEPENDENT_AMBULATORY_CARE_PROVIDER_SITE_OTHER): Payer: Medicare Other | Admitting: Family Medicine

## 2016-03-08 VITALS — BP 133/77 | HR 67 | Wt 111.0 lb

## 2016-03-08 DIAGNOSIS — R1013 Epigastric pain: Secondary | ICD-10-CM | POA: Diagnosis not present

## 2016-03-08 DIAGNOSIS — I10 Essential (primary) hypertension: Secondary | ICD-10-CM | POA: Diagnosis not present

## 2016-03-08 DIAGNOSIS — G459 Transient cerebral ischemic attack, unspecified: Secondary | ICD-10-CM | POA: Diagnosis not present

## 2016-03-08 LAB — COMPLETE METABOLIC PANEL WITH GFR
ALBUMIN: 4 g/dL (ref 3.6–5.1)
ALT: 24 U/L (ref 6–29)
AST: 21 U/L (ref 10–35)
Alkaline Phosphatase: 51 U/L (ref 33–130)
BILIRUBIN TOTAL: 0.5 mg/dL (ref 0.2–1.2)
BUN: 16 mg/dL (ref 7–25)
CO2: 28 mmol/L (ref 20–31)
Calcium: 9.2 mg/dL (ref 8.6–10.4)
Chloride: 104 mmol/L (ref 98–110)
Creat: 0.98 mg/dL — ABNORMAL HIGH (ref 0.60–0.93)
GFR, EST NON AFRICAN AMERICAN: 56 mL/min — AB (ref >=60)
GFR, Est African American: 65 mL/min (ref >=60)
GLUCOSE: 91 mg/dL (ref 65–99)
Potassium: 4.9 mmol/L (ref 3.5–5.3)
SODIUM: 139 mmol/L (ref 135–146)
TOTAL PROTEIN: 6.8 g/dL (ref 6.1–8.1)

## 2016-03-08 LAB — LIPID PANEL
CHOLESTEROL: 250 mg/dL — AB (ref 125–200)
HDL: 69 mg/dL (ref >=46)
LDL Cholesterol: 151 mg/dL — ABNORMAL HIGH (ref <130)
TRIGLYCERIDES: 150 mg/dL — AB (ref <150)
Total CHOL/HDL Ratio: 3.6 Ratio (ref <=5.0)
VLDL: 30 mg/dL (ref <30)

## 2016-03-08 LAB — LIPASE: Lipase: 26 U/L (ref 7–60)

## 2016-03-08 NOTE — Progress Notes (Signed)
CC: Mercedes Decker is a 75 y.o. female is here for Hypertension   Subjective: HPI:  Follow essential hypertension: She's been taking her amlodipine and metoprolol with 100% compliance. Since I saw her last and she started on baby aspirin she's no longer having any new motor or sensory disturbances. She denies any chest pain shortness of breath orthopnea nor peripheral edema.  Her only complaint today is 2 weeks of epigastric discomfort that radiates up into the back of her throat. She tells me 1-2 years ago she had an H. pylori infection that was successfully treated with antibiotics and her current symptoms feel identical to that time. She is currently not taking any medications to help with this. It's worse when eating. There is no exertional component to her discomfort. She's had a little bit of constipation but no diarrhea. She denies any nausea but feels that she gets full faster than what she is normally accustomed to. No unintentional weight loss.   Review Of Systems Outlined In HPI  Past Medical History  Diagnosis Date  . Hypertension   . Arthritis   . Migraine   . Osteoporosis     Past Surgical History  Procedure Laterality Date  . Abdominal hysterectomy    . Breast surgery    . Back surgery    . Knee arthroscopy    . Incisional hernia repair    . Bunionectomy     Family History  Problem Relation Age of Onset  . Alzheimer's disease Mother   . Heart disease Father   . Cancer Sister   . Diabetes Sister   . Heart disease Brother   . Diabetes Brother   . Cancer Brother     Social History   Social History  . Marital Status: Married    Spouse Name: N/A  . Number of Children: N/A  . Years of Education: N/A   Occupational History  . Not on file.   Social History Main Topics  . Smoking status: Never Smoker   . Smokeless tobacco: Never Used  . Alcohol Use: No  . Drug Use: No  . Sexual Activity: Yes    Birth Control/ Protection: Post-menopausal   Other Topics  Concern  . Not on file   Social History Narrative     Objective: BP 133/77 mmHg  Pulse 67  Wt 111 lb (50.349 kg)  General: Alert and Oriented, No Acute Distress HEENT: Pupils equal, round, reactive to light. Conjunctivae clear.  Moist mucous membranes pharynx unremarkable Lungs: Clear to auscultation bilaterally, no wheezing/ronchi/rales.  Comfortable work of breathing. Good air movement. Cardiac: Regular rate and rhythm. Normal S1/S2.  No murmurs, rubs, nor gallops.   Abdomen: Normal bowel sounds, soft and non tender without palpable masses. Extremities: No peripheral edema.  Strong peripheral pulses.  Mental Status: No depression, anxiety, nor agitation. Skin: Warm and dry.  Assessment & Plan: Tiauna was seen today for hypertension.  Diagnoses and all orders for this visit:  Essential hypertension -     Lipid panel  Transient cerebral ischemia, unspecified transient cerebral ischemia type -     Lipid panel  Abdominal pain, epigastric -     COMPLETE METABOLIC PANEL WITH GFR -     Lipase   Essential hypertension with history of TIA: Currently controlled on current antihypertensive regimen. She is due for a lipid panel today. Continue current aspirin and antihypertensive regimen. Epigastric discomfort: Rule out hepatitis and pancreatitis. If normal will treat with triple therapy for possible H. pylori.  No Follow-up on file.

## 2016-03-09 ENCOUNTER — Telehealth: Payer: Self-pay | Admitting: Family Medicine

## 2016-03-09 MED ORDER — CLARITHROMYCIN 500 MG PO TABS: 500.0000 mg | ORAL_TABLET | Freq: Two times a day (BID) | ORAL | Status: DC

## 2016-03-09 MED ORDER — PANTOPRAZOLE SODIUM 40 MG PO TBEC: 40.0000 mg | DELAYED_RELEASE_TABLET | Freq: Every day | ORAL | Status: DC

## 2016-03-09 MED ORDER — AMOXICILLIN 500 MG PO TABS: 1000.0000 mg | ORAL_TABLET | Freq: Two times a day (BID) | ORAL | Status: DC

## 2016-03-09 MED ORDER — ATORVASTATIN CALCIUM 20 MG PO TABS: 20.0000 mg | ORAL_TABLET | Freq: Every day | ORAL | Status: DC

## 2016-03-09 NOTE — Telephone Encounter (Signed)
Pt.notified

## 2016-03-09 NOTE — Telephone Encounter (Signed)
Will you please let patient know that her abdominal pain does not seem to be coming from her pancreas or liver. I'd recommend starting a 14 day course of clarithromycin and amoxicillin that I sent to her pharmacy. This should be taken with pantoprazole daily, also at her pharmacy.  Also her LDL cholesterol was 150 with a goal of less than 100 and I'd recommend starting a cholesterol medication called atorvastatin sent to the pharmacy.

## 2016-03-10 ENCOUNTER — Emergency Department (INDEPENDENT_AMBULATORY_CARE_PROVIDER_SITE_OTHER)
Admission: EM | Admit: 2016-03-10 | Discharge: 2016-03-10 | Disposition: A | Discharge status: Home / Self Care | Payer: Medicare Other | Source: Home / Self Care | Attending: Family Medicine | Admitting: Family Medicine

## 2016-03-10 ENCOUNTER — Encounter: Payer: Self-pay | Admitting: *Deleted

## 2016-03-10 DIAGNOSIS — T7840XA Allergy, unspecified, initial encounter: Secondary | ICD-10-CM

## 2016-03-10 NOTE — ED Notes (Signed)
Pt took first dose of Amoxicillin 500mg  about 7 am this morning. A couple hours later developed hoarseness, blanched spots on her chest, nausea and vaginal redness and irritation. Denies difficulty breathing or swallowing.

## 2016-03-10 NOTE — Discharge Instructions (Signed)
Discontinue all new medications for now.  Rest, increase fluid intake.  Avoid sun exposure until symptoms resolve.   May take Benadryl 25mg , one or two every 4 to 6 hours as needed. If symptoms become significantly worse during the night or over the weekend, proceed to the local emergency room.

## 2016-03-10 NOTE — ED Provider Notes (Signed)
CSN: [id number]     Arrival date & time 03/10/16  1306 History   First MD Initiated Contact with Patient 03/10/16 1332     Chief Complaint  Patient presents with  . Allergic Reaction      HPI Comments: At about 7am today patient began her first doses of triple therapy for H. Pylori (patoprazole 40gm, amoxicillin 500mg , and clarithromycin 500mg ), and a first dose of atorvastatin 20mg .  She states that she gradually began to feel fatigued, and about 4 hours later she felt ?rash on chest (4 small lesions), myalgias, headache, hoarseness, dry mouth, and vaginal burning sensation.  No swelling in mouth/throat, shortness of breath, wheezing, light-headedness, itching.  No fevers, chills, and sweats.  She now feels somewhat better.  The history is provided by the patient.    Past Medical History  Diagnosis Date  . Hypertension   . Arthritis   . Migraine   . Osteoporosis    Past Surgical History  Procedure Laterality Date  . Abdominal hysterectomy    . Breast surgery    . Back surgery    . Knee arthroscopy    . Incisional hernia repair    . Bunionectomy     Family History  Problem Relation Age of Onset  . Alzheimer's disease Mother   . Heart disease Father   . Cancer Sister   . Diabetes Sister   . Heart disease Brother   . Diabetes Brother   . Cancer Brother    Social History  Substance Use Topics  . Smoking status: Never Smoker   . Smokeless tobacco: Never Used  . Alcohol Use: No   OB History    No data available     Review of Systems No sore throat + hoarse No cough No pleuritic pain No wheezing No nasal congestion ? post-nasal drainage No sinus pain/pressure No itchy/red eyes No earache No hemoptysis No SOB No fever/chills No nausea No vomiting No abdominal pain No diarrhea No urinary symptoms ? skin rash + fatigue + myalgias + headache Used OTC meds without relief  Allergies  Ace inhibitors; Cortisone; Losartan; Meloxicam; and Vicodin  Home  Medications   Prior to Admission medications   Medication Sig Start Date End Date Taking? Authorizing Provider  amLODipine (NORVASC) 5 MG tablet TAKE 1 TABLET EVERY DAY 01/18/16   Sean Hommel, DO  amoxicillin (AMOXIL) 500 MG tablet Take 2 tablets (1,000 mg total) by mouth 2 (two) times daily. 03/09/16   Marcial Pacas, DO  aspirin EC 81 MG tablet Take 1 tablet (81 mg total) by mouth daily. 11/30/15   Sean Hommel, DO  atorvastatin (LIPITOR) 20 MG tablet Take 1 tablet (20 mg total) by mouth daily. 03/09/16   Sean Hommel, DO  CALCIUM-MAGNESIUM-ZINC PO Take by mouth.    Historical Provider, MD  cholecalciferol (VITAMIN D) 1000 UNITS tablet Take 2,000 Units by mouth daily.    Historical Provider, MD  clarithromycin (BIAXIN) 500 MG tablet Take 1 tablet (500 mg total) by mouth 2 (two) times daily. 03/09/16   Marcial Pacas, DO  denosumab (PROLIA) 60 MG/ML SOLN injection Inject 60 mg into the skin every 6 (six) months. Administer in upper arm, thigh, or abdomen    Historical Provider, MD  diclofenac (VOLTAREN) 75 MG EC tablet Take 75 mg by mouth daily.    Historical Provider, MD  metoprolol succinate (TOPROL-XL) 50 MG 24 hr tablet Take 1 tablet (50 mg total) by mouth daily. Take with or immediately following a meal. 01/18/16  Sean Hommel, DO  pantoprazole (PROTONIX) 40 MG tablet Take 1 tablet (40 mg total) by mouth daily. 03/09/16   Marcial Pacas, DO  pyridOXINE (VITAMIN B-6) 100 MG tablet Take 100 mg by mouth daily.    Historical Provider, MD  SUMAtriptan 6 MG/0.5ML SOAJ Inject 1 Syringe into the skin once. Inject 6 mg into skin at onset of headache one time 04/03/13   Lanice Shirts, MD  vitamin C (ASCORBIC ACID) 500 MG tablet Take 500 mg by mouth daily.    Historical Provider, MD   Meds Ordered and Administered this Visit  Medications - No data to display  BP 130/76 mmHg  Pulse 80  Temp(Src) 97.6 F (36.4 C) (Oral)  Wt 110 lb (49.896 kg)  SpO2 99% No data found.   Physical Exam Nursing notes and  Vital Signs reviewed. Appearance:  Patient appears stated age, and in no acute distress Eyes:  Pupils are equal, round, and reactive to light and accomodation.  Extraocular movement is intact.  Conjunctivae are not inflamed  Ears:  Canals normal.  Tympanic membranes normal.  Nose:  Mildly congested turbinates.  No sinus tenderness.   Mouth:  Normal; no evidence angioedema. Pharynx:  Normal Neck:  Supple.  Tender enlarged posterior/lateral nodes are palpated bilaterally  Lungs:  Clear to auscultation.  Breath sounds are equal.  Moving air well. Heart:  Regular rate and rhythm without murmurs, rubs, or gallops.  Abdomen:  Nontender without masses or hepatosplenomegaly.  Bowel sounds are present.  No CVA or flank tenderness.  Extremities:  No edema.  Skin:  No definite rash observed.   ED Course  Procedures none  MDM   1. Allergic reaction, initial encounter    Patient's present symptoms are not progressing. Note tender posterior/lateral cervical adenopathy; patient's symptoms (except for vaginal burning) could possibly represent early viral URI.  Discontinue all new medications for now.  Rest, increase fluid intake.  Avoid sun exposure until symptoms resolve.   May take Benadryl 25mg , one or two every 4 to 6 hours as needed. If symptoms become significantly worse during the night or over the weekend, proceed to the local emergency room.  Followup with Family Doctor in 4 days.    Kandra Nicolas, MD 03/10/16 [phone]

## 2016-03-14 ENCOUNTER — Encounter: Payer: Self-pay | Admitting: Family Medicine

## 2016-03-14 ENCOUNTER — Ambulatory Visit (INDEPENDENT_AMBULATORY_CARE_PROVIDER_SITE_OTHER): Payer: Medicare Other | Admitting: Family Medicine

## 2016-03-14 VITALS — BP 118/76 | HR 61 | Wt 111.0 lb

## 2016-03-14 DIAGNOSIS — L298 Other pruritus: Secondary | ICD-10-CM | POA: Diagnosis not present

## 2016-03-14 DIAGNOSIS — R300 Dysuria: Secondary | ICD-10-CM

## 2016-03-14 DIAGNOSIS — R1013 Epigastric pain: Secondary | ICD-10-CM

## 2016-03-14 DIAGNOSIS — N898 Other specified noninflammatory disorders of vagina: Secondary | ICD-10-CM

## 2016-03-14 LAB — POCT URINALYSIS DIPSTICK
Bilirubin, UA: NEGATIVE
Glucose, UA: NEGATIVE
Ketones, UA: NEGATIVE
Nitrite, UA: NEGATIVE
PROTEIN UA: NEGATIVE
RBC UA: NEGATIVE
SPEC GRAV UA: 1.01
UROBILINOGEN UA: 0.2
pH, UA: 5.5

## 2016-03-14 NOTE — Progress Notes (Signed)
CC: Mercedes Decker is a 75 y.o. female is here for Allergic Reaction   Subjective: HPI:  3-4 hours after taking her new prescription of amoxicillin, clindamycin and atorvastatin she had a headache, myalgias, decreased appetite, and fatigue. She also had a rash on her chest. She was seen at our urgent care and was advised to start on Benadryl and to not take any of the above medications. She recalls symptoms lasted less than 24 hours she feels that she is back to her regular state of health but continues to have some epigastric discomfort and a burning sensation when urinating.. She denies any difficulty swallowing, fevers, chills, change in appetite, nauseousness, diarrhea or flushing.    Review Of Systems Outlined In HPI  Past Medical History  Diagnosis Date  . Hypertension   . Arthritis   . Migraine   . Osteoporosis     Past Surgical History  Procedure Laterality Date  . Abdominal hysterectomy    . Breast surgery    . Back surgery    . Knee arthroscopy    . Incisional hernia repair    . Bunionectomy     Family History  Problem Relation Age of Onset  . Alzheimer's disease Mother   . Heart disease Father   . Cancer Sister   . Diabetes Sister   . Heart disease Brother   . Diabetes Brother   . Cancer Brother     Social History   Social History  . Marital Status: Married    Spouse Name: N/A  . Number of Children: N/A  . Years of Education: N/A   Occupational History  . Not on file.   Social History Main Topics  . Smoking status: Never Smoker   . Smokeless tobacco: Never Used  . Alcohol Use: No  . Drug Use: No  . Sexual Activity: Yes    Birth Control/ Protection: Post-menopausal   Other Topics Concern  . Not on file   Social History Narrative     Objective: BP 118/76 mmHg  Pulse 61  Wt 111 lb (50.349 kg)  Vital signs reviewed. General: Alert and Oriented, No Acute Distress HEENT: Pupils equal, round, reactive to light. Conjunctivae clear.  External  ears unremarkable.  Moist mucous membranes. Lungs: Clear and comfortable work of breathing, speaking in full sentences without accessory muscle use. Cardiac: Regular rate and rhythm.  Neuro: CN II-XII grossly intact, gait normal. Extremities: No peripheral edema.  Strong peripheral pulses.  Mental Status: No depression, anxiety, nor agitation. Logical though process. Skin: Warm and dry.  Assessment & Plan: Mercedes Decker was seen today for allergic reaction.  Diagnoses and all orders for this visit:  Vaginal itching -     POCT urinalysis dipstick  Abdominal pain, epigastric   Her pharmacy was contacted and in July 2015 she tolerated the same dose of amoxicillin and clarithromycin that was recently prescribed. She has no prior history of taking atorvastatin prior to last week. Suspect that the culprit was a reaction to atorvastatin therefore stopped that and begin taking Protonix and amoxicillin. If no reaction occurs within 24 hours and begin taking clarithromycin already on hand.Signs and symptoms requring emergent/urgent reevaluation were discussed with the patient.   No Follow-up on file.

## 2016-03-14 NOTE — Addendum Note (Signed)
Addended by: Delrae Alfred on: 03/14/2016 03:25 PM   Modules accepted: Orders

## 2016-03-16 LAB — URINE CULTURE: Organism ID, Bacteria: 10000

## 2016-03-18 ENCOUNTER — Encounter: Payer: Self-pay | Admitting: Family Medicine

## 2016-03-21 MED ORDER — FLUCONAZOLE 150 MG PO TABS: ORAL_TABLET | ORAL | 0 refills | Status: AC

## 2016-03-24 DIAGNOSIS — M47816 Spondylosis without myelopathy or radiculopathy, lumbar region: Secondary | ICD-10-CM | POA: Diagnosis not present

## 2016-03-24 DIAGNOSIS — M549 Dorsalgia, unspecified: Secondary | ICD-10-CM | POA: Diagnosis not present

## 2016-04-19 DIAGNOSIS — H2512 Age-related nuclear cataract, left eye: Secondary | ICD-10-CM | POA: Diagnosis not present

## 2016-04-19 DIAGNOSIS — H16223 Keratoconjunctivitis sicca, not specified as Sjogren's, bilateral: Secondary | ICD-10-CM | POA: Diagnosis not present

## 2016-04-26 ENCOUNTER — Ambulatory Visit (INDEPENDENT_AMBULATORY_CARE_PROVIDER_SITE_OTHER): Payer: Medicare Other | Admitting: Family Medicine

## 2016-04-26 ENCOUNTER — Encounter: Payer: Self-pay | Admitting: Family Medicine

## 2016-04-26 VITALS — BP 143/78 | HR 60 | Wt 111.0 lb

## 2016-04-26 DIAGNOSIS — Z23 Encounter for immunization: Secondary | ICD-10-CM | POA: Diagnosis not present

## 2016-04-26 DIAGNOSIS — E539 Vitamin B deficiency, unspecified: Secondary | ICD-10-CM

## 2016-04-26 MED ORDER — AMITRIPTYLINE HCL 10 MG PO TABS: 10.0000 mg | ORAL_TABLET | Freq: Every day | ORAL | 2 refills | Status: DC

## 2016-04-26 NOTE — Progress Notes (Signed)
CC: Mercedes Decker is a 75 y.o. female is here for Foot Pain   Subjective: HPI:  For the past week she's been feeling a burning sensation on the soles of both feet only present when trying to fall asleep. It slightly improved if she exposes her feet to cold air. It never happens any other time of the day other than some tenderness when she is walking. She's never had this before. She denies any changes in the appearance of her feet. She denies any recent trauma or overexertion. She denies any weakness or any sensory disturbances other than that described above. Symptoms are mild in severity. No interventions as of yet.     Review Of Systems Outlined In HPI  Past Medical History:  Diagnosis Date  . Arthritis   . Hypertension   . Migraine   . Osteoporosis     Past Surgical History:  Procedure Laterality Date  . ABDOMINAL HYSTERECTOMY    . BACK SURGERY    . BREAST SURGERY    . BUNIONECTOMY    . INCISIONAL HERNIA REPAIR    . KNEE ARTHROSCOPY     Family History  Problem Relation Age of Onset  . Alzheimer's disease Mother   . Heart disease Father   . Cancer Sister   . Diabetes Sister   . Heart disease Brother   . Diabetes Brother   . Cancer Brother     Social History   Social History  . Marital status: Married    Spouse name: N/A  . Number of children: N/A  . Years of education: N/A   Occupational History  . Not on file.   Social History Main Topics  . Smoking status: Never Smoker  . Smokeless tobacco: Never Used  . Alcohol use No  . Drug use: No  . Sexual activity: Yes    Birth control/ protection: Post-menopausal   Other Topics Concern  . Not on file   Social History Narrative  . No narrative on file     Objective: BP (!) 143/78   Pulse 60   Wt 111 lb (50.3 kg)   BMI 22.61 kg/m   Vital signs reviewed. General: Alert and Oriented, No Acute Distress HEENT: Pupils equal, round, reactive to light. Conjunctivae clear.  External ears unremarkable.   Moist mucous membranes. Lungs: Clear and comfortable work of breathing, speaking in full sentences without accessory muscle use. Cardiac: Regular rate and rhythm.  Neuro: CN II-XII grossly intact, gait normal. Extremities: No peripheral edema.  Strong peripheral pulses.  Mental Status: No depression, anxiety, nor agitation. Logical though process. Skin: Warm and dry. Monofilament testing intact bilaterally in both feet. No skin abnormalities on either feet.   Assessment & Plan: Mercedes Decker was seen today for foot pain.  Diagnoses and all orders for this visit:  Burning feet syndrome -     amitriptyline (ELAVIL) 10 MG tablet; Take 1 tablet (10 mg total) by mouth at bedtime.   Burning feet: Trial of amitriptyline at night, if not helpful the next step would be referral to neurology for consideration of nerve conduction studies. She recently had a normal blood sugar.  Discussed with this patient that I will be resigning from my position here with [facility] in September in order to stay with my family who will be moving to [facility]. I let him know about the providers that are still accepting patients and I feel that this individual will be under great care if he/she stays here  with [facility].  Return if symptoms worsen or fail to improve.

## 2016-04-26 NOTE — Addendum Note (Signed)
Addended by: Delrae Alfred on: 04/26/2016 02:38 PM   Modules accepted: Orders

## 2016-05-01 DIAGNOSIS — G5601 Carpal tunnel syndrome, right upper limb: Secondary | ICD-10-CM | POA: Insufficient documentation

## 2016-05-01 DIAGNOSIS — M1811 Unilateral primary osteoarthritis of first carpometacarpal joint, right hand: Secondary | ICD-10-CM | POA: Insufficient documentation

## 2016-05-05 DIAGNOSIS — G5601 Carpal tunnel syndrome, right upper limb: Secondary | ICD-10-CM | POA: Diagnosis not present

## 2016-05-05 DIAGNOSIS — M1811 Unilateral primary osteoarthritis of first carpometacarpal joint, right hand: Secondary | ICD-10-CM | POA: Diagnosis not present

## 2016-05-05 DIAGNOSIS — G8918 Other acute postprocedural pain: Secondary | ICD-10-CM | POA: Diagnosis not present

## 2016-05-05 DIAGNOSIS — M79602 Pain in left arm: Secondary | ICD-10-CM | POA: Diagnosis not present

## 2016-05-05 DIAGNOSIS — Z79899 Other long term (current) drug therapy: Secondary | ICD-10-CM | POA: Diagnosis not present

## 2016-05-05 DIAGNOSIS — M7072 Other bursitis of hip, left hip: Secondary | ICD-10-CM | POA: Diagnosis not present

## 2016-05-05 DIAGNOSIS — Z8249 Family history of ischemic heart disease and other diseases of the circulatory system: Secondary | ICD-10-CM | POA: Diagnosis not present

## 2016-05-05 DIAGNOSIS — Z833 Family history of diabetes mellitus: Secondary | ICD-10-CM | POA: Diagnosis not present

## 2016-05-05 DIAGNOSIS — Z885 Allergy status to narcotic agent status: Secondary | ICD-10-CM | POA: Diagnosis not present

## 2016-05-05 DIAGNOSIS — R51 Headache: Secondary | ICD-10-CM | POA: Diagnosis not present

## 2016-05-05 DIAGNOSIS — M25552 Pain in left hip: Secondary | ICD-10-CM | POA: Diagnosis not present

## 2016-05-05 DIAGNOSIS — Z87891 Personal history of nicotine dependence: Secondary | ICD-10-CM | POA: Diagnosis not present

## 2016-05-05 DIAGNOSIS — M5417 Radiculopathy, lumbosacral region: Secondary | ICD-10-CM | POA: Diagnosis not present

## 2016-05-05 DIAGNOSIS — M961 Postlaminectomy syndrome, not elsewhere classified: Secondary | ICD-10-CM | POA: Diagnosis not present

## 2016-05-05 DIAGNOSIS — K219 Gastro-esophageal reflux disease without esophagitis: Secondary | ICD-10-CM | POA: Diagnosis not present

## 2016-05-05 DIAGNOSIS — M654 Radial styloid tenosynovitis [de Quervain]: Secondary | ICD-10-CM | POA: Diagnosis not present

## 2016-05-05 DIAGNOSIS — Z888 Allergy status to other drugs, medicaments and biological substances status: Secondary | ICD-10-CM | POA: Diagnosis not present

## 2016-05-05 DIAGNOSIS — Z91048 Other nonmedicinal substance allergy status: Secondary | ICD-10-CM | POA: Diagnosis not present

## 2016-05-05 DIAGNOSIS — M47816 Spondylosis without myelopathy or radiculopathy, lumbar region: Secondary | ICD-10-CM | POA: Diagnosis not present

## 2016-05-05 DIAGNOSIS — E7800 Pure hypercholesterolemia, unspecified: Secondary | ICD-10-CM | POA: Diagnosis not present

## 2016-05-05 DIAGNOSIS — I10 Essential (primary) hypertension: Secondary | ICD-10-CM | POA: Diagnosis not present

## 2016-05-26 DIAGNOSIS — M19049 Primary osteoarthritis, unspecified hand: Secondary | ICD-10-CM | POA: Diagnosis not present

## 2016-05-26 DIAGNOSIS — R29898 Other symptoms and signs involving the musculoskeletal system: Secondary | ICD-10-CM | POA: Diagnosis not present

## 2016-05-26 DIAGNOSIS — M25641 Stiffness of right hand, not elsewhere classified: Secondary | ICD-10-CM | POA: Diagnosis not present

## 2016-05-26 DIAGNOSIS — M79641 Pain in right hand: Secondary | ICD-10-CM | POA: Diagnosis not present

## 2016-05-30 DIAGNOSIS — R29898 Other symptoms and signs involving the musculoskeletal system: Secondary | ICD-10-CM | POA: Diagnosis not present

## 2016-05-30 DIAGNOSIS — M25641 Stiffness of right hand, not elsewhere classified: Secondary | ICD-10-CM | POA: Diagnosis not present

## 2016-05-30 DIAGNOSIS — M79641 Pain in right hand: Secondary | ICD-10-CM | POA: Diagnosis not present

## 2016-05-30 DIAGNOSIS — M1811 Unilateral primary osteoarthritis of first carpometacarpal joint, right hand: Secondary | ICD-10-CM | POA: Diagnosis not present

## 2016-06-06 DIAGNOSIS — M19049 Primary osteoarthritis, unspecified hand: Secondary | ICD-10-CM | POA: Diagnosis not present

## 2016-06-06 DIAGNOSIS — M25531 Pain in right wrist: Secondary | ICD-10-CM | POA: Diagnosis not present

## 2016-06-06 DIAGNOSIS — M79644 Pain in right finger(s): Secondary | ICD-10-CM | POA: Diagnosis not present

## 2016-06-06 DIAGNOSIS — R29898 Other symptoms and signs involving the musculoskeletal system: Secondary | ICD-10-CM | POA: Diagnosis not present

## 2016-06-13 DIAGNOSIS — M19049 Primary osteoarthritis, unspecified hand: Secondary | ICD-10-CM | POA: Diagnosis not present

## 2016-06-13 DIAGNOSIS — M25641 Stiffness of right hand, not elsewhere classified: Secondary | ICD-10-CM | POA: Diagnosis not present

## 2016-06-13 DIAGNOSIS — M79644 Pain in right finger(s): Secondary | ICD-10-CM | POA: Diagnosis not present

## 2016-06-13 DIAGNOSIS — R29898 Other symptoms and signs involving the musculoskeletal system: Secondary | ICD-10-CM | POA: Diagnosis not present

## 2016-06-20 DIAGNOSIS — R29898 Other symptoms and signs involving the musculoskeletal system: Secondary | ICD-10-CM | POA: Diagnosis not present

## 2016-06-20 DIAGNOSIS — M25531 Pain in right wrist: Secondary | ICD-10-CM | POA: Diagnosis not present

## 2016-06-20 DIAGNOSIS — M79644 Pain in right finger(s): Secondary | ICD-10-CM | POA: Diagnosis not present

## 2016-06-20 DIAGNOSIS — M19049 Primary osteoarthritis, unspecified hand: Secondary | ICD-10-CM | POA: Diagnosis not present

## 2016-06-30 DIAGNOSIS — R29898 Other symptoms and signs involving the musculoskeletal system: Secondary | ICD-10-CM | POA: Diagnosis not present

## 2016-06-30 DIAGNOSIS — M19049 Primary osteoarthritis, unspecified hand: Secondary | ICD-10-CM | POA: Diagnosis not present

## 2016-06-30 DIAGNOSIS — M79644 Pain in right finger(s): Secondary | ICD-10-CM | POA: Diagnosis not present

## 2016-06-30 DIAGNOSIS — M25641 Stiffness of right hand, not elsewhere classified: Secondary | ICD-10-CM | POA: Diagnosis not present

## 2016-07-01 ENCOUNTER — Other Ambulatory Visit: Payer: Self-pay | Admitting: *Deleted

## 2016-07-01 MED ORDER — PANTOPRAZOLE SODIUM 40 MG PO TBEC: 40.0000 mg | DELAYED_RELEASE_TABLET | Freq: Every day | ORAL | 5 refills | Status: DC

## 2016-07-04 DIAGNOSIS — M19049 Primary osteoarthritis, unspecified hand: Secondary | ICD-10-CM | POA: Diagnosis not present

## 2016-07-04 DIAGNOSIS — M25531 Pain in right wrist: Secondary | ICD-10-CM | POA: Diagnosis not present

## 2016-07-04 DIAGNOSIS — R29898 Other symptoms and signs involving the musculoskeletal system: Secondary | ICD-10-CM | POA: Diagnosis not present

## 2016-07-04 DIAGNOSIS — M79644 Pain in right finger(s): Secondary | ICD-10-CM | POA: Diagnosis not present

## 2016-07-08 DIAGNOSIS — E559 Vitamin D deficiency, unspecified: Secondary | ICD-10-CM | POA: Diagnosis not present

## 2016-07-08 DIAGNOSIS — M19041 Primary osteoarthritis, right hand: Secondary | ICD-10-CM | POA: Diagnosis not present

## 2016-07-08 DIAGNOSIS — Z5181 Encounter for therapeutic drug level monitoring: Secondary | ICD-10-CM | POA: Diagnosis not present

## 2016-07-08 DIAGNOSIS — M810 Age-related osteoporosis without current pathological fracture: Secondary | ICD-10-CM | POA: Diagnosis not present

## 2016-07-08 DIAGNOSIS — Z79899 Other long term (current) drug therapy: Secondary | ICD-10-CM | POA: Diagnosis not present

## 2016-07-08 DIAGNOSIS — I10 Essential (primary) hypertension: Secondary | ICD-10-CM | POA: Diagnosis not present

## 2016-07-08 DIAGNOSIS — M818 Other osteoporosis without current pathological fracture: Secondary | ICD-10-CM | POA: Diagnosis not present

## 2016-07-08 LAB — BASIC METABOLIC PANEL
Creatinine: 0.8 mg/dL (ref 0.5–1.1)
Glucose: 98 mg/dL
POTASSIUM: 5.2 mmol/L (ref 3.4–5.3)
Sodium: 142 mmol/L (ref 137–147)

## 2016-07-08 LAB — HEPATIC FUNCTION PANEL
ALT: 20 U/L (ref 7–35)
AST: 21 U/L (ref 13–35)

## 2016-07-14 DIAGNOSIS — M19049 Primary osteoarthritis, unspecified hand: Secondary | ICD-10-CM | POA: Diagnosis not present

## 2016-07-14 DIAGNOSIS — S63502A Unspecified sprain of left wrist, initial encounter: Secondary | ICD-10-CM | POA: Diagnosis not present

## 2016-07-14 DIAGNOSIS — M79644 Pain in right finger(s): Secondary | ICD-10-CM | POA: Diagnosis not present

## 2016-07-14 DIAGNOSIS — M25531 Pain in right wrist: Secondary | ICD-10-CM | POA: Diagnosis not present

## 2016-07-18 DIAGNOSIS — M19049 Primary osteoarthritis, unspecified hand: Secondary | ICD-10-CM | POA: Diagnosis not present

## 2016-07-18 DIAGNOSIS — M25531 Pain in right wrist: Secondary | ICD-10-CM | POA: Diagnosis not present

## 2016-07-18 DIAGNOSIS — M79644 Pain in right finger(s): Secondary | ICD-10-CM | POA: Diagnosis not present

## 2016-07-18 DIAGNOSIS — R29898 Other symptoms and signs involving the musculoskeletal system: Secondary | ICD-10-CM | POA: Diagnosis not present

## 2016-07-20 ENCOUNTER — Other Ambulatory Visit: Payer: Self-pay | Admitting: *Deleted

## 2016-07-20 MED ORDER — METOPROLOL SUCCINATE ER 50 MG PO TB24: ORAL_TABLET | ORAL | 0 refills | Status: DC

## 2016-07-20 MED ORDER — AMLODIPINE BESYLATE 5 MG PO TABS: 5.0000 mg | ORAL_TABLET | Freq: Every day | ORAL | 0 refills | Status: DC

## 2016-07-25 DIAGNOSIS — M79641 Pain in right hand: Secondary | ICD-10-CM | POA: Diagnosis not present

## 2016-07-27 ENCOUNTER — Ambulatory Visit (INDEPENDENT_AMBULATORY_CARE_PROVIDER_SITE_OTHER): Payer: Medicare Other | Admitting: Osteopathic Medicine

## 2016-07-27 ENCOUNTER — Encounter: Payer: Self-pay | Admitting: Osteopathic Medicine

## 2016-07-27 VITALS — BP 127/63 | HR 63 | Temp 98.5°F | Ht 59.0 in | Wt 110.0 lb

## 2016-07-27 DIAGNOSIS — I10 Essential (primary) hypertension: Secondary | ICD-10-CM | POA: Diagnosis not present

## 2016-07-27 DIAGNOSIS — K219 Gastro-esophageal reflux disease without esophagitis: Secondary | ICD-10-CM | POA: Diagnosis not present

## 2016-07-27 DIAGNOSIS — E785 Hyperlipidemia, unspecified: Secondary | ICD-10-CM

## 2016-07-27 DIAGNOSIS — M159 Polyosteoarthritis, unspecified: Secondary | ICD-10-CM

## 2016-07-27 DIAGNOSIS — M810 Age-related osteoporosis without current pathological fracture: Secondary | ICD-10-CM

## 2016-07-27 DIAGNOSIS — G43909 Migraine, unspecified, not intractable, without status migrainosus: Secondary | ICD-10-CM | POA: Diagnosis not present

## 2016-07-27 DIAGNOSIS — M150 Primary generalized (osteo)arthritis: Secondary | ICD-10-CM

## 2016-07-27 MED ORDER — METOPROLOL SUCCINATE ER 50 MG PO TB24: 50.0000 mg | ORAL_TABLET | Freq: Every day | ORAL | 3 refills | Status: DC

## 2016-07-27 MED ORDER — AMLODIPINE BESYLATE 5 MG PO TABS: 5.0000 mg | ORAL_TABLET | Freq: Every day | ORAL | 3 refills | Status: DC

## 2016-07-27 NOTE — Progress Notes (Signed)
HPI: Mercedes Decker is a 75 y.o. female  who presents to [facility] today, 07/27/16,  for chief complaint of:  Chief Complaint  Patient presents with  . Medication Refill  . Ear Pain    left    Chronic Medical Issues  Essential hypertension: Well-controlled on current medications, refills as below  Migraine: Triptan as needed, patient does not need medication refills at this time. Migraines are rare.  Hx TIA: On aspirin and statin. Blood pressure well controlled. No neurologic deficits, no dizziness, no weakness.  Gastroesophageal reflux disease: Controlled on current medications, stable  Osteoporosis: Seeing Dr Georgiann Cocker for Prolia injections, patient presents recent lab work ordered by this doctor as well.  Primary osteoarthritis involving multiple joints: Breast pain has been bothering her, she is following with orthopedics for this.  Hyperlipidemia: recent cholesterol 02/2016, borderline high   Acute illness  Ear Pain:  . Location: L ear . Quality: pressure . Severity: mild . Duration: 6 months  . Timing: worse with lying down and feels pressure with sitting upright  . Assoc signs/symptoms: no hearing changes    Past medical, surgical, social and family history reviewed: Patient Active Problem List   Diagnosis Date Noted  . TIA (transient ischemic attack) 03/08/2016  . Right hand pain 03/07/2016  . Patient has active power of attorney for health care 11/30/2015  . Essential hypertension 12/25/2014  . Murmur, cardiac 09/11/2012  . DJD (degenerative joint disease) 09/03/2012  . S/P hysterectomy 09/03/2012  . Osteoporosis  L fem neck  Dexa 07/2014  -3.1 09/03/2012  . Migraine 09/03/2012  . History of anemia 09/03/2012  . Hyperlipidemia 09/03/2012  . GERD (gastroesophageal reflux disease) 09/03/2012   Past Surgical History:  Procedure Laterality Date  . ABDOMINAL HYSTERECTOMY    . BACK SURGERY    . BREAST SURGERY    .  BUNIONECTOMY    . INCISIONAL HERNIA REPAIR    . KNEE ARTHROSCOPY     Social History  Substance Use Topics  . Smoking status: Never Smoker  . Smokeless tobacco: Never Used  . Alcohol use No   Family History  Problem Relation Age of Onset  . Alzheimer's disease Mother   . Heart disease Father   . Cancer Sister   . Diabetes Sister   . Heart disease Brother   . Diabetes Brother   . Cancer Brother      Current medication list and allergy/intolerance information reviewed:   Current Outpatient Prescriptions on File Prior to Visit  Medication Sig Dispense Refill  . amitriptyline (ELAVIL) 10 MG tablet Take 1 tablet (10 mg total) by mouth at bedtime. 30 tablet 2  . amLODipine (NORVASC) 5 MG tablet Take 1 tablet (5 mg total) by mouth daily. 30 tablet 0  . aspirin EC 81 MG tablet Take 1 tablet (81 mg total) by mouth daily.    Marland Kitchen atorvastatin (LIPITOR) 20 MG tablet Take 1 tablet (20 mg total) by mouth daily. 90 tablet 1  . CALCIUM-MAGNESIUM-ZINC PO Take by mouth.    . cholecalciferol (VITAMIN D) 1000 UNITS tablet Take 2,000 Units by mouth daily.    Marland Kitchen denosumab (PROLIA) 60 MG/ML SOLN injection Inject 60 mg into the skin every 6 (six) months. Administer in upper arm, thigh, or abdomen    . diclofenac (VOLTAREN) 75 MG EC tablet Take 75 mg by mouth daily.    . metoprolol succinate (TOPROL-XL) 50 MG 24 hr tablet Take 1 tablet (50 mg total) by mouth  daily. Take with or immediately following a meal. Schedule f/u 30 tablet 0  . pantoprazole (PROTONIX) 40 MG tablet Take 1 tablet (40 mg total) by mouth daily. Please schedule a f/u visit 30 tablet 5  . pyridOXINE (VITAMIN B-6) 100 MG tablet Take 100 mg by mouth daily.    . SUMAtriptan 6 MG/0.5ML SOAJ Inject 1 Syringe into the skin once. Inject 6 mg into skin at onset of headache one time 1 Syringe 3  . vitamin C (ASCORBIC ACID) 500 MG tablet Take 500 mg by mouth daily.     No current facility-administered medications on file prior to visit.     Allergies  Allergen Reactions  . Ace Inhibitors Cough  . Atorvastatin     Possible allergy  . Cortisone     Flushing and worsening joint pain  . Losartan Other (See Comments)    Sweats, cramps  . Meloxicam     Too sleepy  . Vicodin [Hydrocodone-Acetaminophen] Other (See Comments)    Pt states that she stops breathing when she takes  Pain meds      Review of Systems:  Constitutional: No recent illness  HEENT: No  headache, no vision change, +ear pain as per HPI  Cardiac: No  chest pain, No  pressure, No palpitations  Respiratory:  No  shortness of breath. No  Cough  Gastrointestinal: No  abdominal pain  Musculoskeletal: No new myalgia/arthralgia  Neurologic: No  weakness, No  Dizziness  Psychiatric: No  concerns with depression, No  concerns with anxiety  Exam:  BP 127/63   Pulse 63   Temp 98.5 F (36.9 C) (Oral)   Ht 4\' 11"  (1.499 m)   Wt 110 lb (49.9 kg)   BMI 22.22 kg/m   Constitutional: VS see above. General Appearance: alert, well-developed, well-nourished, NAD  Eyes: Normal lids and conjunctive, non-icteric sclera  Ears, Nose, Mouth, Throat: MMM, Normal external inspection ears/nares/mouth/lips/gums. Tympanic memory and of good by cerumen on the right. On left, mild clear effusion behind TM is visible, no erythema/bulging. Nasal mucosa  Neck: No masses, trachea midline. No lymphadenopathy.  Respiratory: Normal respiratory effort. no wheeze, no rhonchi, no rales  Cardiovascular: S1/S2 normal, no murmur, no rub/gallop auscultated. RRR.   Musculoskeletal: Gait normal. Symmetric and independent movement of all extremities  Neurological: Normal balance/coordination. No tremor.  Skin: warm, dry, intact.   Psychiatric: Normal judgment/insight. Normal mood and affect. Oriented x3.    Recent labs reviewed: No concerns on CMP.  ASSESSMENT/PLAN:   Refills as below  Updated medication list, patient is no longer taking amitriptyline.  Okay to  refill other prescriptions when due, atorvastatin, Protonix.  Essential hypertension - Plan: amLODipine (NORVASC) 5 MG tablet, metoprolol succinate (TOPROL-XL) 50 MG 24 hr tablet, DISCONTINUED: metoprolol succinate (TOPROL-XL) 50 MG 24 hr tablet  Migraine without status migrainosus, not intractable, unspecified migraine type  Gastroesophageal reflux disease without esophagitis  Osteoporosis without current pathological fracture, unspecified osteoporosis type  Primary osteoarthritis involving multiple joints  Hyperlipidemia, unspecified hyperlipidemia type     Visit summary with medication list and pertinent instructions was printed for patient to review. All questions at time of visit were answered - patient instructed to contact office with any additional concerns. ER/RTC precautions were reviewed with the patient. Follow-up plan: Return in about 6 months (around 01/24/2017) for annual physical, sooner if needed .

## 2016-08-03 DIAGNOSIS — M79641 Pain in right hand: Secondary | ICD-10-CM | POA: Diagnosis not present

## 2016-08-15 DIAGNOSIS — M79644 Pain in right finger(s): Secondary | ICD-10-CM | POA: Diagnosis not present

## 2016-08-15 DIAGNOSIS — R29898 Other symptoms and signs involving the musculoskeletal system: Secondary | ICD-10-CM | POA: Diagnosis not present

## 2016-08-15 DIAGNOSIS — M25531 Pain in right wrist: Secondary | ICD-10-CM | POA: Diagnosis not present

## 2016-08-15 DIAGNOSIS — M25641 Stiffness of right hand, not elsewhere classified: Secondary | ICD-10-CM | POA: Diagnosis not present

## 2016-08-17 DIAGNOSIS — M79641 Pain in right hand: Secondary | ICD-10-CM | POA: Diagnosis not present

## 2016-08-17 DIAGNOSIS — Z4789 Encounter for other orthopedic aftercare: Secondary | ICD-10-CM | POA: Diagnosis not present

## 2016-08-17 DIAGNOSIS — M75101 Unspecified rotator cuff tear or rupture of right shoulder, not specified as traumatic: Secondary | ICD-10-CM | POA: Diagnosis not present

## 2016-08-18 ENCOUNTER — Encounter: Payer: Self-pay | Admitting: Osteopathic Medicine

## 2016-08-18 DIAGNOSIS — S4991XA Unspecified injury of right shoulder and upper arm, initial encounter: Secondary | ICD-10-CM | POA: Insufficient documentation

## 2016-08-23 ENCOUNTER — Encounter: Payer: Self-pay | Admitting: Osteopathic Medicine

## 2016-08-25 DIAGNOSIS — M25641 Stiffness of right hand, not elsewhere classified: Secondary | ICD-10-CM | POA: Diagnosis not present

## 2016-08-25 DIAGNOSIS — M19049 Primary osteoarthritis, unspecified hand: Secondary | ICD-10-CM | POA: Diagnosis not present

## 2016-08-25 DIAGNOSIS — M79644 Pain in right finger(s): Secondary | ICD-10-CM | POA: Diagnosis not present

## 2016-08-25 DIAGNOSIS — R29898 Other symptoms and signs involving the musculoskeletal system: Secondary | ICD-10-CM | POA: Diagnosis not present

## 2016-08-26 DIAGNOSIS — E559 Vitamin D deficiency, unspecified: Secondary | ICD-10-CM | POA: Diagnosis not present

## 2016-08-26 DIAGNOSIS — M810 Age-related osteoporosis without current pathological fracture: Secondary | ICD-10-CM | POA: Diagnosis not present

## 2016-08-26 DIAGNOSIS — M8588 Other specified disorders of bone density and structure, other site: Secondary | ICD-10-CM | POA: Diagnosis not present

## 2016-09-14 DIAGNOSIS — M19049 Primary osteoarthritis, unspecified hand: Secondary | ICD-10-CM | POA: Diagnosis not present

## 2016-09-14 DIAGNOSIS — M79641 Pain in right hand: Secondary | ICD-10-CM | POA: Diagnosis not present

## 2016-10-03 ENCOUNTER — Ambulatory Visit (INDEPENDENT_AMBULATORY_CARE_PROVIDER_SITE_OTHER): Payer: Medicare Other | Admitting: Osteopathic Medicine

## 2016-10-03 ENCOUNTER — Encounter: Payer: Self-pay | Admitting: Osteopathic Medicine

## 2016-10-03 VITALS — BP 117/65 | HR 73 | Temp 98.2°F | Ht 59.0 in | Wt 108.0 lb

## 2016-10-03 DIAGNOSIS — J069 Acute upper respiratory infection, unspecified: Secondary | ICD-10-CM | POA: Diagnosis not present

## 2016-10-03 DIAGNOSIS — B9789 Other viral agents as the cause of diseases classified elsewhere: Secondary | ICD-10-CM | POA: Diagnosis not present

## 2016-10-03 MED ORDER — BENZONATATE 200 MG PO CAPS: 200.0000 mg | ORAL_CAPSULE | Freq: Three times a day (TID) | ORAL | 0 refills | Status: DC | PRN

## 2016-10-03 MED ORDER — IPRATROPIUM BROMIDE 0.03 % NA SOLN: 2.0000 | Freq: Four times a day (QID) | NASAL | 0 refills | Status: DC | PRN

## 2016-10-03 NOTE — Progress Notes (Signed)
HPI: Mercedes Decker is a 76 y.o. female who presents to [facility] 10/03/16 for chief complaint of:  Chief Complaint  Patient presents with  . Cough    Acute Illness: . Quality: chills, sore throat, headache - feeling a little bit better overall but still coughing in the night. Most bothersome symptoms are coughing and overall fatigue/aching. . Assoc signs/symptoms: see ROS . Duration: 4 days . Modifying factors: has tried the following OTC/Rx medications: alka-seltzer plus am and pm, no other OTC or home remedies other than rest and water   Past medical, social and family history reviewed.   Immune compromising conditions or other risk factors: advanced age  Current medications and allergies reviewed.     Review of Systems:  Constitutional: 91F fever/chills  HEENT: not bad headache, Yes  sore throat, Yesswollen glands  Cardiovascular: No chest pain  Respiratory:Yes  cough, No  shortness of breath  Gastrointestinal: No  nausea, No  vomiting,  No  diarrhea  Musculoskeletal:   Yes myalgia/arthralgia  Skin/Integument:  No rash   Detailed Exam:  BP 117/65   Pulse 73   Temp 98.2 F (36.8 C) (Oral)   Ht 4\' 11"  (1.499 m)   Wt 108 lb (49 kg)   BMI 21.81 kg/m   Constitutional:   VSS, see above.   General Appearance: alert, well-developed, well-nourished, NAD  Eyes:   Normal lids and conjunctive, non-icteric sclera  Ears, Nose, Mouth, Throat:   Normal external inspection ears/nares  Normal mouth/lips/gums, MMM  normal TM  posterior pharynx without erythema, without exudate  nasal mucosa normal  Skin:  Normal inspection, no rash or concerning lesions noted on limited exam  Neck:   No masses, trachea midline. normal lymph nodes  Respiratory:   Normal respiratory effort.   No  wheeze/rhonchi/rales  Cardiovascular:   S1/S2 normal, no murmur/rub/gallop auscultated. RRR.    ASSESSMENT/PLAN: Patient chiefly  was worried about ruling out the flu. Advised more than likely viral upper respiratory infection/cold. Patient has had flu shot this season. Encouraged to continue to do this annually. If worse/change, let us know.  Viral URI with cough - Plan: ipratropium (ATROVENT) 0.03 % nasal spray, benzonatate (TESSALON) 200 MG capsule     Patient Instructions  Note: the following list assumes no pregnancy, normal liver & kidney function and no other drug interactions. Dr. Sheppard Coil has highlighted medications which are safe for you to use, but these may not be appropriate for everyone. Always ask a pharmacist or qualified medical provider if there are any questions!    Aches/Pains, Fever Acetaminophen (Tylenol) 500 mg tablets - take max 2 tablets (1000 mg) every 6 hours (4 times per day)  Ibuprofen (Motrin) 200 mg tablets - take max 4 tablets (800 mg) every 6 hours  Sinus Congestion Prescription Atrovent Cromolyn Nasal Spray (NasalCrom) 1 spray each nostril 3-4 times per day, max 6 imes per day Nasal Saline if desired Oxymetolazone (Afrin, others) sparing use due to rebound congestion Phenylephrine (Sudafed) 10 mg tablets every 4 hours (or the 12-hour formulation) Diphenhydramine (Benadryl) 25 mg tablets - take max 2 tablets every 4 hours  Cough & Sore Throat Prescription cough pills or syrups Dextromethorphan (Robitussin, others) - cough suppressant Guaifenesin (Robitussin, Mucinex, others) - expectorant (helps cough up mucus) (Dextromethorphan and Guaifenesin also come in a combination tablet) Lozenges w/ Benzocaine + Menthol (Cepacol) Honey - as much as you want! Teas which "coat the throat" - look for ingredients [employer], Licorice  Root, Marshmallow Root  Other Zinc Lozenges within 24 hours of symptoms onset - mixed evidence this shortens the duration of the common cold Don't waste your money on Vitamin C or Echinacea       Visit summary was printed for the patient with medications  and pertinent instructions for patient to review. ER/RTC precautions reviewed. All questions answered. Return if symptoms worsen or fail to improve.

## 2016-10-03 NOTE — Patient Instructions (Signed)

## 2017-01-13 DIAGNOSIS — M818 Other osteoporosis without current pathological fracture: Secondary | ICD-10-CM | POA: Diagnosis not present

## 2017-01-24 ENCOUNTER — Encounter: Payer: Self-pay | Admitting: Osteopathic Medicine

## 2017-01-24 ENCOUNTER — Ambulatory Visit (INDEPENDENT_AMBULATORY_CARE_PROVIDER_SITE_OTHER): Payer: Medicare Other | Admitting: Osteopathic Medicine

## 2017-01-24 VITALS — BP 131/75 | HR 63 | Ht <= 58 in | Wt 108.0 lb

## 2017-01-24 DIAGNOSIS — R05 Cough: Secondary | ICD-10-CM

## 2017-01-24 DIAGNOSIS — Z7189 Other specified counseling: Secondary | ICD-10-CM

## 2017-01-24 DIAGNOSIS — R058 Other specified cough: Secondary | ICD-10-CM

## 2017-01-24 DIAGNOSIS — R7301 Impaired fasting glucose: Secondary | ICD-10-CM | POA: Diagnosis not present

## 2017-01-24 DIAGNOSIS — Z0000 Encounter for general adult medical examination without abnormal findings: Secondary | ICD-10-CM

## 2017-01-24 LAB — COMPLETE METABOLIC PANEL WITH GFR
ALBUMIN: 3.7 g/dL (ref 3.6–5.1)
ALK PHOS: 66 U/L (ref 33–130)
ALT: 17 U/L (ref 6–29)
AST: 17 U/L (ref 10–35)
BILIRUBIN TOTAL: 0.3 mg/dL (ref 0.2–1.2)
BUN: 13 mg/dL (ref 7–25)
CO2: 28 mmol/L (ref 20–31)
Calcium: 9.3 mg/dL (ref 8.6–10.4)
Chloride: 104 mmol/L (ref 98–110)
Creat: 0.93 mg/dL (ref 0.60–0.93)
GFR, EST NON AFRICAN AMERICAN: 59 mL/min — AB (ref >=60)
GFR, Est African American: 69 mL/min (ref >=60)
GLUCOSE: 96 mg/dL (ref 65–99)
Potassium: 5 mmol/L (ref 3.5–5.3)
SODIUM: 141 mmol/L (ref 135–146)
TOTAL PROTEIN: 6.7 g/dL (ref 6.1–8.1)

## 2017-01-24 LAB — LIPID PANEL
CHOL/HDL RATIO: 3.5 ratio (ref <5.0)
Cholesterol: 190 mg/dL (ref <200)
HDL: 55 mg/dL (ref >50)
LDL CALC: 109 mg/dL — AB (ref <100)
Triglycerides: 128 mg/dL (ref <150)
VLDL: 26 mg/dL (ref <30)

## 2017-01-24 MED ORDER — METHYLPREDNISOLONE 4 MG PO TBPK: ORAL_TABLET | ORAL | 0 refills | Status: DC

## 2017-01-24 NOTE — Progress Notes (Signed)
HPI: Mercedes Decker is a 76 y.o. female  who presents to [facility] today, 01/24/17,  for chief complaint of:  Chief Complaint  Patient presents with  . Annual Exam     Patient here for annual physical / wellness exam.  See preventive care reviewed as below.  Recent labs reviewed in detail with the patient.   Additional concerns today include:  Cold 2 weeks ago and nagging cough, no fever/chills. No sinus/allergy problems or heartburn problems.    Past medical, surgical, social and family history reviewed: Patient Active Problem List   Diagnosis Date Noted  . Right shoulder injury 08/18/2016  . TIA (transient ischemic attack) 03/08/2016  . Right hand pain 03/07/2016  . Patient has active power of attorney for health care 11/30/2015  . Essential hypertension 12/25/2014  . Murmur, cardiac 09/11/2012  . DJD (degenerative joint disease) 09/03/2012  . S/P hysterectomy 09/03/2012  . Osteoporosis  L fem neck  Dexa 07/2014  -3.1 09/03/2012  . Migraine 09/03/2012  . History of anemia 09/03/2012  . Hyperlipidemia 09/03/2012  . GERD (gastroesophageal reflux disease) 09/03/2012   Past Surgical History:  Procedure Laterality Date  . ABDOMINAL HYSTERECTOMY    . BACK SURGERY    . BREAST SURGERY    . BUNIONECTOMY    . INCISIONAL HERNIA REPAIR    . KNEE ARTHROSCOPY     Social History  Substance Use Topics  . Smoking status: Never Smoker  . Smokeless tobacco: Never Used  . Alcohol use No   Family History  Problem Relation Age of Onset  . Alzheimer's disease Mother   . Heart disease Father   . Cancer Sister   . Diabetes Sister   . Heart disease Brother   . Diabetes Brother   . Cancer Brother      Current medication list and allergy/intolerance information reviewed:   Current Outpatient Prescriptions  Medication Sig Dispense Refill  . amLODipine (NORVASC) 5 MG tablet Take 1 tablet (5 mg total) by mouth daily. 90 tablet 3  . aspirin EC  81 MG tablet Take 1 tablet (81 mg total) by mouth daily.    Marland Kitchen atorvastatin (LIPITOR) 20 MG tablet Take 1 tablet (20 mg total) by mouth daily. 90 tablet 1  . benzonatate (TESSALON) 200 MG capsule Take 1 capsule (200 mg total) by mouth 3 (three) times daily as needed for cough. 20 capsule 0  . CALCIUM-MAGNESIUM-ZINC PO Take by mouth.    . cholecalciferol (VITAMIN D) 1000 UNITS tablet Take 2,000 Units by mouth daily.    Marland Kitchen denosumab (PROLIA) 60 MG/ML SOLN injection Inject 60 mg into the skin every 6 (six) months. Administer in upper arm, thigh, or abdomen    . diclofenac (VOLTAREN) 75 MG EC tablet Take 75 mg by mouth daily.    Marland Kitchen ipratropium (ATROVENT) 0.03 % nasal spray Place 2 sprays into both nostrils 4 (four) times daily as needed for rhinitis. 30 mL 0  . metoprolol succinate (TOPROL-XL) 50 MG 24 hr tablet Take 1 tablet (50 mg total) by mouth daily. Take with or immediately following a meal. 90 tablet 3  . pantoprazole (PROTONIX) 40 MG tablet Take 1 tablet (40 mg total) by mouth daily. Please schedule a f/u visit 30 tablet 5  . pyridOXINE (VITAMIN B-6) 100 MG tablet Take 100 mg by mouth daily.    . SUMAtriptan 6 MG/0.5ML SOAJ Inject 1 Syringe into the skin once. Inject 6 mg into skin at onset of headache  one time 1 Syringe 3  . vitamin C (ASCORBIC ACID) 500 MG tablet Take 500 mg by mouth daily.     No current facility-administered medications for this visit.    Allergies  Allergen Reactions  . Ace Inhibitors Cough  . Atorvastatin     Possible allergy  . Cortisone     Flushing and worsening joint pain  . Losartan Other (See Comments)    Sweats, cramps  . Meloxicam     Too sleepy  . Vicodin [Hydrocodone-Acetaminophen] Other (See Comments)    Pt states that she stops breathing when she takes  Pain meds      Review of Systems:  Constitutional:  No  fever, no chills, +recent viral URI illness but overall better now, No unintentional weight changes. No significant fatigue.   HEENT: No   headache, no vision change, no hearing change, No sore throat, No  sinus pressure  Cardiac: No  chest pain, No  pressure, No palpitations  Respiratory:  No  shortness of breath. +Cough  Gastrointestinal: No  abdominal pain, No  nausea  Musculoskeletal: No new myalgia/arthralgia  Genitourinary: No  incontinence, No  abnormal genital bleeding, No abnormal genital discharge  Skin: No  Rash, No other wounds/concerning lesions  Hem/Onc: No  easy bruising/bleeding  Endocrine: No cold intolerance,  No heat intolerance  Neurologic: No  weakness, No  dizziness  Psychiatric: No  concerns with depression, No  concerns with anxiety, No sleep problems, No mood problems  Exam:  BP 131/75   Pulse 63   Ht 4\' 10"  (1.473 m)   Wt 108 lb (49 kg)   BMI 22.57 kg/m   Constitutional: VS see above. General Appearance: alert, well-developed, well-nourished, NAD  Eyes: Normal lids and conjunctive, non-icteric sclera  Ears, Nose, Mouth, Throat: MMM, Normal external inspection ears/nares/mouth/lips/gums. TM normal bilaterally. Pharynx/tonsils no erythema, no exudate. Nasal mucosa normal.   Neck: No masses, trachea midline. No thyroid enlargement. No tenderness/mass appreciated. No lymphadenopathy  Respiratory: Normal respiratory effort. no wheeze, no rhonchi, no rales  Cardiovascular: S1/S2 normal, no murmur, no rub/gallop auscultated. RRR. No lower extremity edema. Pedal pulse II/IV bilaterally DP and PT.   Gastrointestinal: Nontender, no masses. No hepatomegaly, no splenomegaly. No hernia appreciated. Bowel sounds normal. Rectal exam deferred.   Musculoskeletal: Gait normal. No clubbing/cyanosis of digits.   Neurological: Normal balance/coordination. No tremor. No cranial nerve deficit on limited exam. Motor and sensation intact and symmetric. Cerebellar reflexes intact.   Skin: warm, dry, intact. No rash/ulcer. No concerning nevi or subq nodules on limited exam.    Psychiatric: Normal  judgment/insight. Normal mood and affect. Oriented x3.   Recent labs reviewed, fasting glucose 105, needs testing for diabetes.  ASSESSMENT/PLAN:   Annual physical exam - Plan: COMPLETE METABOLIC PANEL WITH GFR, Lipid panel  Elevated fasting glucose - Plan: Hemoglobin A1c  Post-viral cough syndrome - Steroid burst for now, lung exam normal, no fever/chills, consider x-ray if no improvement  Advance directive discussed with patient - Has advanced reactive, thought she gave copy to previous PCP but I don't see one on file, will bring to office   FEMALE PREVENTIVE CARE Updated 01/24/17   ANNUAL SCREENING/COUNSELING  Diet/Exercise - HEALTHY HABITS DISCUSSED TO DECREASE CV RISK History  Smoking Status  . Never Smoker  Smokeless Tobacco  . Never Used   History  Alcohol Use No   Depression screen PHQ 2/9 06/25/2014  Decreased Interest 0  Down, Depressed, Hopeless 0  PHQ - 2 Score 0  Domestic violence concerns - no  HTN SCREENING - SEE VITALS  SEXUAL HEALTH  Sexually active in the past year - No  Need/want STI testing today? - no  Concerns about libido or pain with sex? - no  Plans for pregnancy? - postmenopausal  INFECTIOUS DISEASE SCREENING  HIV - does not need  GC/CT - does not need  HepC - DOB [birth year] - does not need  TB - does not need  DISEASE SCREENING  Lipid - needs  DM2 - needs  Osteoporosis - women age 38+ - does not need - eing treated for osteoporosis   CANCER SCREENING  Cervical - does not need  Breast - does not need  Lung - does not need  Colon - does not need  ADULT VACCINATION  Influenza - annual vaccine recommended  Td - booster every 10 years   Zoster - option at 80, yes at 60+   PCV13 - was not indicated  PPSV23 - was not indicated Immunization History  Administered Date(s) Administered  . Influenza, High Dose Seasonal PF 04/26/2016  . Influenza-Unspecified 05/29/2010, 05/18/2011, 05/15/2012, 05/29/2013,  05/28/2014, 05/01/2015, 06/29/2016  . Pneumococcal Conjugate-13 04/26/2016  . Pneumococcal Polysaccharide-23 03/30/2007  . Tdap 04/19/2008  . Zoster 08/29/2008     Visit summary with medication list and pertinent instructions was printed for patient to review. All questions at time of visit were answered - patient instructed to contact office with any additional concerns. ER/RTC precautions were reviewed with the patient. Follow-up plan: Return in about 1 year (around 01/24/2018) for [place] sooner if needed .

## 2017-01-25 LAB — HEMOGLOBIN A1C
Hgb A1c MFr Bld: 5.3 % (ref <5.7)
Mean Plasma Glucose: 105 mg/dL

## 2017-02-08 DIAGNOSIS — K219 Gastro-esophageal reflux disease without esophagitis: Secondary | ICD-10-CM | POA: Diagnosis not present

## 2017-02-08 DIAGNOSIS — R490 Dysphonia: Secondary | ICD-10-CM | POA: Diagnosis not present

## 2017-02-10 ENCOUNTER — Encounter: Payer: Self-pay | Admitting: Osteopathic Medicine

## 2017-02-10 DIAGNOSIS — R490 Dysphonia: Secondary | ICD-10-CM | POA: Insufficient documentation

## 2017-03-20 ENCOUNTER — Other Ambulatory Visit: Payer: Self-pay

## 2017-04-06 DIAGNOSIS — M79641 Pain in right hand: Secondary | ICD-10-CM | POA: Diagnosis not present

## 2017-04-18 DIAGNOSIS — Z0000 Encounter for general adult medical examination without abnormal findings: Secondary | ICD-10-CM | POA: Diagnosis not present

## 2017-04-19 DIAGNOSIS — R29898 Other symptoms and signs involving the musculoskeletal system: Secondary | ICD-10-CM | POA: Diagnosis not present

## 2017-04-19 DIAGNOSIS — M79644 Pain in right finger(s): Secondary | ICD-10-CM | POA: Diagnosis not present

## 2017-04-19 DIAGNOSIS — M25531 Pain in right wrist: Secondary | ICD-10-CM | POA: Diagnosis not present

## 2017-04-24 DIAGNOSIS — Z961 Presence of intraocular lens: Secondary | ICD-10-CM | POA: Diagnosis not present

## 2017-04-24 DIAGNOSIS — Z83511 Family history of glaucoma: Secondary | ICD-10-CM | POA: Diagnosis not present

## 2017-04-24 DIAGNOSIS — H02831 Dermatochalasis of right upper eyelid: Secondary | ICD-10-CM | POA: Diagnosis not present

## 2017-04-24 DIAGNOSIS — H524 Presbyopia: Secondary | ICD-10-CM | POA: Diagnosis not present

## 2017-04-24 DIAGNOSIS — H04123 Dry eye syndrome of bilateral lacrimal glands: Secondary | ICD-10-CM | POA: Diagnosis not present

## 2017-04-24 DIAGNOSIS — H43393 Other vitreous opacities, bilateral: Secondary | ICD-10-CM | POA: Diagnosis not present

## 2017-04-24 DIAGNOSIS — H52203 Unspecified astigmatism, bilateral: Secondary | ICD-10-CM | POA: Diagnosis not present

## 2017-04-24 DIAGNOSIS — H02834 Dermatochalasis of left upper eyelid: Secondary | ICD-10-CM | POA: Diagnosis not present

## 2017-04-24 DIAGNOSIS — H2512 Age-related nuclear cataract, left eye: Secondary | ICD-10-CM | POA: Diagnosis not present

## 2017-04-24 DIAGNOSIS — H5203 Hypermetropia, bilateral: Secondary | ICD-10-CM | POA: Diagnosis not present

## 2017-05-02 DIAGNOSIS — M79642 Pain in left hand: Secondary | ICD-10-CM | POA: Diagnosis not present

## 2017-05-02 DIAGNOSIS — M25532 Pain in left wrist: Secondary | ICD-10-CM | POA: Diagnosis not present

## 2017-05-02 DIAGNOSIS — M19049 Primary osteoarthritis, unspecified hand: Secondary | ICD-10-CM | POA: Diagnosis not present

## 2017-05-04 DIAGNOSIS — Z23 Encounter for immunization: Secondary | ICD-10-CM | POA: Diagnosis not present

## 2017-05-08 ENCOUNTER — Ambulatory Visit (INDEPENDENT_AMBULATORY_CARE_PROVIDER_SITE_OTHER): Payer: Medicare Other | Admitting: Family Medicine

## 2017-05-08 ENCOUNTER — Ambulatory Visit (HOSPITAL_BASED_OUTPATIENT_CLINIC_OR_DEPARTMENT_OTHER)
Admission: RE | Admit: 2017-05-08 | Discharge: 2017-05-08 | Disposition: A | Discharge status: Home / Self Care | Payer: Medicare Other | Source: Ambulatory Visit | Attending: Family Medicine | Admitting: Family Medicine

## 2017-05-08 ENCOUNTER — Telehealth: Payer: Self-pay | Admitting: Family Medicine

## 2017-05-08 VITALS — BP 136/75 | HR 68 | Temp 98.1°F | Wt 108.0 lb

## 2017-05-08 DIAGNOSIS — D490 Neoplasm of unspecified behavior of digestive system: Secondary | ICD-10-CM

## 2017-05-08 DIAGNOSIS — R1031 Right lower quadrant pain: Secondary | ICD-10-CM | POA: Insufficient documentation

## 2017-05-08 DIAGNOSIS — K8689 Other specified diseases of pancreas: Secondary | ICD-10-CM | POA: Insufficient documentation

## 2017-05-08 DIAGNOSIS — R109 Unspecified abdominal pain: Secondary | ICD-10-CM | POA: Diagnosis not present

## 2017-05-08 LAB — COMPLETE METABOLIC PANEL WITH GFR
AG Ratio: 1.7 (calc) (ref 1.0–2.5)
ALT: 16 U/L (ref 6–29)
AST: 18 U/L (ref 10–35)
Albumin: 4.3 g/dL (ref 3.6–5.1)
Alkaline phosphatase (APISO): 50 U/L (ref 33–130)
BUN / CREAT RATIO: 21 (calc) (ref 6–22)
BUN: 20 mg/dL (ref 7–25)
CALCIUM: 10 mg/dL (ref 8.6–10.4)
CO2: 30 mmol/L (ref 20–32)
CREATININE: 0.95 mg/dL — AB (ref 0.60–0.93)
Chloride: 104 mmol/L (ref 98–110)
GFR, EST AFRICAN AMERICAN: 67 mL/min/{1.73_m2} (ref >=60)
GFR, Est Non African American: 58 mL/min/{1.73_m2} — ABNORMAL LOW (ref >=60)
GLUCOSE: 108 mg/dL — AB (ref 65–99)
Globulin: 2.6 g/dL (calc) (ref 1.9–3.7)
Potassium: 6.1 mmol/L — ABNORMAL HIGH (ref 3.5–5.3)
Sodium: 138 mmol/L (ref 135–146)
TOTAL PROTEIN: 6.9 g/dL (ref 6.1–8.1)
Total Bilirubin: 0.3 mg/dL (ref 0.2–1.2)

## 2017-05-08 LAB — POCT URINALYSIS DIPSTICK
BILIRUBIN UA: NEGATIVE
Blood, UA: NEGATIVE
GLUCOSE UA: NEGATIVE
Ketones, UA: NEGATIVE
Nitrite, UA: NEGATIVE
PH UA: 6.5 (ref 5.0–8.0)
Protein, UA: NEGATIVE
Spec Grav, UA: <=1.005 — AB (ref 1.010–1.025)
Urobilinogen, UA: 0.2 E.U./dL

## 2017-05-08 LAB — CBC
HCT: 39.1 % (ref 35.0–45.0)
HEMOGLOBIN: 13.5 g/dL (ref 11.7–15.5)
MCH: 31.3 pg (ref 27.0–33.0)
MCHC: 34.5 g/dL (ref 32.0–36.0)
MCV: 90.5 fL (ref 80.0–100.0)
MPV: 9.9 fL (ref 7.5–12.5)
Platelets: 289 10*3/uL (ref 140–400)
RBC: 4.32 10*6/uL (ref 3.80–5.10)
RDW: 12.4 % (ref 11.0–15.0)
WBC: 7.8 10*3/uL (ref 3.8–10.8)

## 2017-05-08 LAB — LIPASE: Lipase: 48 U/L (ref 7–60)

## 2017-05-08 MED ORDER — CEPHALEXIN 500 MG PO CAPS: 500.0000 mg | ORAL_CAPSULE | Freq: Two times a day (BID) | ORAL | 0 refills | Status: DC

## 2017-05-08 MED ORDER — IOPAMIDOL (ISOVUE-300) INJECTION 61%
100.0000 mL | Freq: Once | INTRAVENOUS | Status: AC | PRN
  Administered 2017-05-08: 100 mL via INTRAVENOUS

## 2017-05-08 NOTE — Patient Instructions (Signed)
Thank you for coming in today. Get labs NOW.  Go to xray to get oral contrast and get info about CT scheduling.  We will contact you with results ASAP.  If your belly pain worsens, or you have high fever, bad vomiting, blood in your stool or black tarry stool go to the Emergency Room.    Abdominal Pain, Adult Abdominal pain can be caused by many things. Often, abdominal pain is not serious and it gets better with no treatment or by being treated at home. However, sometimes abdominal pain is serious. Your health care provider will do a medical history and a physical exam to try to determine the cause of your abdominal pain. Follow these instructions at home:  Take over-the-counter and prescription medicines only as told by your health care provider. Do not take a laxative unless told by your health care provider.  Drink enough fluid to keep your urine clear or pale yellow.  Watch your condition for any changes.  Keep all follow-up visits as told by your health care provider. This is important. Contact a health care provider if:  Your abdominal pain changes or gets worse.  You are not hungry or you lose weight without trying.  You are constipated or have diarrhea for more than 2-3 days.  You have pain when you urinate or have a bowel movement.  Your abdominal pain wakes you up at night.  Your pain gets worse with meals, after eating, or with certain foods.  You are throwing up and cannot keep anything down.  You have a fever. Get help right away if:  Your pain does not go away as soon as your health care provider told you to expect.  You cannot stop throwing up.  Your pain is only in areas of the abdomen, such as the right side or the left lower portion of the abdomen.  You have bloody or black stools, or stools that look like tar.  You have severe pain, cramping, or bloating in your abdomen.  You have signs of dehydration, such as: ? Dark urine, very little urine, or no  urine. ? Cracked lips. ? Dry mouth. ? Sunken eyes. ? Sleepiness. ? Weakness. This information is not intended to replace advice given to you by your health care provider. Make sure you discuss any questions you have with your health care provider. Document Released: 05/25/2005 Document Revised: 03/04/2016 Document Reviewed: 01/27/2016 Elsevier Interactive Patient Education  2017 [employer].

## 2017-05-08 NOTE — Telephone Encounter (Signed)
I called the patient about her CT scan results.  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  Plan for gastroenterology referral ASAP.   Pt still has lower pelvic pain.  UTI is a possibility.  Will treat with keflex.   Follow up with PCP

## 2017-05-08 NOTE — Progress Notes (Signed)
Mercedes Decker is a 76 y.o. female who presents to [facility] today for pelvic pain and pressure. Symptoms present for several days. Patient denies any urinary frequency urgency or dysuria or. She notes an intense urge to defecate but is unable to defecate much at all. She's had some small hard stools. She denies any bleeding fevers chills vomiting or diarrhea. She has tried some over-the-counter medications which have not helped much. She notes pelvic pain is located in the right lower quadrant. She denies any menstrual periods. She notes that her symptoms do not feel consistent with prior history of urinary tract infection.   Past Medical History:  Diagnosis Date  . Arthritis   . Hypertension   . Migraine   . Osteoporosis    Past Surgical History:  Procedure Laterality Date  . ABDOMINAL HYSTERECTOMY    . BACK SURGERY    . BREAST SURGERY    . BUNIONECTOMY    . INCISIONAL HERNIA REPAIR    . KNEE ARTHROSCOPY     Social History  Substance Use Topics  . Smoking status: Never Smoker  . Smokeless tobacco: Never Used  . Alcohol use No   family history includes Alzheimer's disease in her mother; Cancer in her brother and sister; Diabetes in her brother and sister; Heart disease in her brother and father.  ROS as above:  Medications: Current Outpatient Prescriptions  Medication Sig Dispense Refill  . amLODipine (NORVASC) 5 MG tablet Take 1 tablet (5 mg total) by mouth daily. 90 tablet 3  . aspirin EC 81 MG tablet Take 1 tablet (81 mg total) by mouth daily.    Marland Kitchen atorvastatin (LIPITOR) 20 MG tablet Take 1 tablet (20 mg total) by mouth daily. 90 tablet 1  . CALCIUM-MAGNESIUM-ZINC PO Take by mouth.    . cholecalciferol (VITAMIN D) 1000 UNITS tablet Take 2,000 Units by mouth daily.    Marland Kitchen denosumab (PROLIA) 60 MG/ML SOLN injection Inject 60 mg into the skin every 6  (six) months. Administer in upper arm, thigh, or abdomen    . diclofenac (VOLTAREN) 75 MG EC tablet Take 75 mg by mouth daily.    Marland Kitchen ipratropium (ATROVENT) 0.03 % nasal spray Place 2 sprays into both nostrils 4 (four) times daily as needed for rhinitis. 30 mL 0  . methylPREDNISolone (MEDROL DOSEPAK) 4 MG TBPK tablet 6-day pack as directed 21 tablet 0  . metoprolol succinate (TOPROL-XL) 50 MG 24 hr tablet Take 1 tablet (50 mg total) by mouth daily. Take with or immediately following a meal. 90 tablet 3  . pyridOXINE (VITAMIN B-6) 100 MG tablet Take 100 mg by mouth daily.    . SUMAtriptan 6 MG/0.5ML SOAJ Inject 1 Syringe into the skin once. Inject 6 mg into skin at onset of headache one time 1 Syringe 3  . vitamin C (ASCORBIC ACID) 500 MG tablet Take 500 mg by mouth daily.     No current facility-administered medications for this visit.    Allergies  Allergen Reactions  . Ace Inhibitors Cough  . Atorvastatin     Possible allergy  . Cortisone     Flushing and worsening joint pain  . Losartan Other (See Comments)    Sweats, cramps  . Meloxicam     Too sleepy  . Vicodin [Hydrocodone-Acetaminophen] Other (See Comments)    Pt states that she stops breathing when she takes  Pain meds    Health Maintenance Health Maintenance  Topic Date Due  . INFLUENZA VACCINE  03/29/2017  . MAMMOGRAM  09/16/2017 (Originally 08/26/2015)  . TETANUS/TDAP  08/28/2020  . DEXA SCAN  Completed  . PNA vac Low Risk Adult  Completed     Exam:  BP 136/75   Pulse 68   Temp 98.1 F (36.7 C) (Oral)   Wt 108 lb (49 kg)   BMI 22.57 kg/m  Gen: Well NAD HEENT: EOMI,  MMM Lungs: Normal work of breathing. CTABL Heart: RRR no MRG Abd: NABS, Soft. Nondistended, Right lower quadrant tenderness to palpation with mild rebound tenderness. Minimal guarding Exts: Brisk capillary refill, warm and well perfused.  Rectal exam: No significant hemorrhoids. No stool in the vault. Normal rectal exam   Results for orders  placed or performed in visit on 05/08/17 (from the past 72 hour(s))  POCT Urinalysis Dipstick     Status: Abnormal   Collection Time: 05/08/17 11:59 AM  Result Value Ref Range   Color, UA YELLOW    Clarity, UA CLEAR    Glucose, UA NEG    Bilirubin, UA NEG    Ketones, UA NEG    Spec Grav, UA <=1.005 (A) 1.010 - 1.025   Blood, UA NEG    pH, UA 6.5 5.0 - 8.0   Protein, UA NEG    Urobilinogen, UA 0.2 0.2 or 1.0 E.U./dL   Nitrite, UA NEG    Leukocytes, UA Small (1+) (A) Negative   No results found.    Assessment and Plan: 76 y.o. female with Right lower quadrant abdominal pain with unclear etiology. Urine culture pending as urinalysis is not particularly impressive. His symptoms are becoming more bothersome I think patient would benefit from CT scan of her abdomen and pelvis today. Plan for stat CBC and metabolic panel to check creatinine prior to IV contrast infusion. Plan for CT scan today. Adjust treatment plan based on results of scan. Differential this time is broad appendicitis is suspected however   Orders Placed This Encounter  Procedures  . Urine Culture  . CT Abdomen Pelvis W Contrast    Standing Status:   Future    Standing Expiration Date:   08/07/2018    Order Specific Question:   If indicated for the ordered procedure, I authorize the administration of contrast media per Radiology protocol    Answer:   Yes    Order Specific Question:   Preferred imaging location?    Answer:   [facility] Specific Question:   Radiology Contrast Protocol - do NOT remove file path    Answer:   [email or web address]  . CBC  . COMPLETE METABOLIC PANEL WITH GFR  . Lipase  . POCT Urinalysis Dipstick   No orders of the defined types were placed in this encounter.    Discussed warning signs or symptoms. Please see discharge instructions. Patient expresses understanding.

## 2017-05-09 LAB — URINE CULTURE
MICRO NUMBER:: 80994161
RESULT: NO GROWTH
SPECIMEN QUALITY: ADEQUATE

## 2017-05-10 DIAGNOSIS — K862 Cyst of pancreas: Secondary | ICD-10-CM | POA: Diagnosis not present

## 2017-05-10 DIAGNOSIS — I10 Essential (primary) hypertension: Secondary | ICD-10-CM | POA: Diagnosis not present

## 2017-05-15 ENCOUNTER — Other Ambulatory Visit: Payer: Self-pay | Admitting: Osteopathic Medicine

## 2017-05-15 DIAGNOSIS — I10 Essential (primary) hypertension: Secondary | ICD-10-CM

## 2017-05-16 DIAGNOSIS — I10 Essential (primary) hypertension: Secondary | ICD-10-CM | POA: Diagnosis not present

## 2017-05-16 DIAGNOSIS — K219 Gastro-esophageal reflux disease without esophagitis: Secondary | ICD-10-CM | POA: Diagnosis not present

## 2017-05-16 DIAGNOSIS — G43909 Migraine, unspecified, not intractable, without status migrainosus: Secondary | ICD-10-CM | POA: Diagnosis not present

## 2017-05-16 DIAGNOSIS — Z888 Allergy status to other drugs, medicaments and biological substances status: Secondary | ICD-10-CM | POA: Diagnosis not present

## 2017-05-16 DIAGNOSIS — G2581 Restless legs syndrome: Secondary | ICD-10-CM | POA: Diagnosis not present

## 2017-05-16 DIAGNOSIS — Z79899 Other long term (current) drug therapy: Secondary | ICD-10-CM | POA: Diagnosis not present

## 2017-05-16 DIAGNOSIS — Z87891 Personal history of nicotine dependence: Secondary | ICD-10-CM | POA: Diagnosis not present

## 2017-05-16 DIAGNOSIS — M19041 Primary osteoarthritis, right hand: Secondary | ICD-10-CM | POA: Diagnosis not present

## 2017-05-16 DIAGNOSIS — Z7982 Long term (current) use of aspirin: Secondary | ICD-10-CM | POA: Diagnosis not present

## 2017-05-16 DIAGNOSIS — Z91048 Other nonmedicinal substance allergy status: Secondary | ICD-10-CM | POA: Diagnosis not present

## 2017-05-16 DIAGNOSIS — Z885 Allergy status to narcotic agent status: Secondary | ICD-10-CM | POA: Diagnosis not present

## 2017-05-16 DIAGNOSIS — E785 Hyperlipidemia, unspecified: Secondary | ICD-10-CM | POA: Diagnosis not present

## 2017-05-16 DIAGNOSIS — K869 Disease of pancreas, unspecified: Secondary | ICD-10-CM | POA: Diagnosis not present

## 2017-05-16 DIAGNOSIS — K862 Cyst of pancreas: Secondary | ICD-10-CM | POA: Diagnosis not present

## 2017-05-16 DIAGNOSIS — K8689 Other specified diseases of pancreas: Secondary | ICD-10-CM | POA: Diagnosis not present

## 2017-05-23 DIAGNOSIS — R188 Other ascites: Secondary | ICD-10-CM | POA: Diagnosis not present

## 2017-05-23 DIAGNOSIS — I700 Atherosclerosis of aorta: Secondary | ICD-10-CM | POA: Diagnosis not present

## 2017-05-23 DIAGNOSIS — D7389 Other diseases of spleen: Secondary | ICD-10-CM | POA: Diagnosis not present

## 2017-05-23 DIAGNOSIS — K869 Disease of pancreas, unspecified: Secondary | ICD-10-CM | POA: Diagnosis not present

## 2017-05-29 DIAGNOSIS — R898 Other abnormal findings in specimens from other organs, systems and tissues: Secondary | ICD-10-CM | POA: Diagnosis not present

## 2017-05-29 DIAGNOSIS — K862 Cyst of pancreas: Secondary | ICD-10-CM | POA: Diagnosis not present

## 2017-05-30 DIAGNOSIS — K8689 Other specified diseases of pancreas: Secondary | ICD-10-CM | POA: Diagnosis not present

## 2017-05-30 DIAGNOSIS — R1902 Left upper quadrant abdominal swelling, mass and lump: Secondary | ICD-10-CM | POA: Diagnosis not present

## 2017-06-07 DIAGNOSIS — I10 Essential (primary) hypertension: Secondary | ICD-10-CM | POA: Diagnosis not present

## 2017-06-07 DIAGNOSIS — K869 Disease of pancreas, unspecified: Secondary | ICD-10-CM | POA: Diagnosis not present

## 2017-06-14 DIAGNOSIS — I10 Essential (primary) hypertension: Secondary | ICD-10-CM | POA: Diagnosis not present

## 2017-06-14 DIAGNOSIS — K869 Disease of pancreas, unspecified: Secondary | ICD-10-CM | POA: Diagnosis not present

## 2017-06-28 DIAGNOSIS — E785 Hyperlipidemia, unspecified: Secondary | ICD-10-CM | POA: Diagnosis not present

## 2017-06-28 DIAGNOSIS — Z01810 Encounter for preprocedural cardiovascular examination: Secondary | ICD-10-CM | POA: Diagnosis not present

## 2017-06-28 DIAGNOSIS — K869 Disease of pancreas, unspecified: Secondary | ICD-10-CM | POA: Diagnosis not present

## 2017-06-28 DIAGNOSIS — Z79899 Other long term (current) drug therapy: Secondary | ICD-10-CM | POA: Diagnosis not present

## 2017-06-28 DIAGNOSIS — I10 Essential (primary) hypertension: Secondary | ICD-10-CM | POA: Diagnosis not present

## 2017-06-28 DIAGNOSIS — R001 Bradycardia, unspecified: Secondary | ICD-10-CM | POA: Diagnosis not present

## 2017-06-28 DIAGNOSIS — Z01812 Encounter for preprocedural laboratory examination: Secondary | ICD-10-CM | POA: Diagnosis not present

## 2017-07-03 DIAGNOSIS — K8689 Other specified diseases of pancreas: Secondary | ICD-10-CM | POA: Diagnosis not present

## 2017-07-03 DIAGNOSIS — K869 Disease of pancreas, unspecified: Secondary | ICD-10-CM | POA: Diagnosis not present

## 2017-07-03 DIAGNOSIS — Z23 Encounter for immunization: Secondary | ICD-10-CM | POA: Diagnosis not present

## 2017-07-03 DIAGNOSIS — Q8909 Congenital malformations of spleen: Secondary | ICD-10-CM | POA: Diagnosis not present

## 2017-07-03 DIAGNOSIS — K219 Gastro-esophageal reflux disease without esophagitis: Secondary | ICD-10-CM | POA: Diagnosis present

## 2017-07-03 DIAGNOSIS — I9589 Other hypotension: Secondary | ICD-10-CM | POA: Diagnosis present

## 2017-07-03 DIAGNOSIS — D509 Iron deficiency anemia, unspecified: Secondary | ICD-10-CM | POA: Diagnosis present

## 2017-07-03 DIAGNOSIS — G43909 Migraine, unspecified, not intractable, without status migrainosus: Secondary | ICD-10-CM | POA: Diagnosis present

## 2017-07-03 DIAGNOSIS — G8918 Other acute postprocedural pain: Secondary | ICD-10-CM | POA: Diagnosis not present

## 2017-07-03 DIAGNOSIS — K859 Acute pancreatitis without necrosis or infection, unspecified: Secondary | ICD-10-CM | POA: Diagnosis not present

## 2017-07-03 DIAGNOSIS — C252 Malignant neoplasm of tail of pancreas: Secondary | ICD-10-CM | POA: Diagnosis present

## 2017-07-03 DIAGNOSIS — R339 Retention of urine, unspecified: Secondary | ICD-10-CM | POA: Diagnosis present

## 2017-07-03 DIAGNOSIS — M19041 Primary osteoarthritis, right hand: Secondary | ICD-10-CM | POA: Diagnosis present

## 2017-07-03 DIAGNOSIS — R897 Abnormal histological findings in specimens from other organs, systems and tissues: Secondary | ICD-10-CM | POA: Diagnosis not present

## 2017-07-03 DIAGNOSIS — Z87891 Personal history of nicotine dependence: Secondary | ICD-10-CM | POA: Diagnosis not present

## 2017-07-03 DIAGNOSIS — G2581 Restless legs syndrome: Secondary | ICD-10-CM | POA: Diagnosis present

## 2017-07-03 DIAGNOSIS — M810 Age-related osteoporosis without current pathological fracture: Secondary | ICD-10-CM | POA: Diagnosis present

## 2017-07-10 MED ORDER — DIPHENHYDRAMINE HCL 50 MG/ML IJ SOLN
12.50 | INTRAMUSCULAR | Status: DC
Start: ? — End: 2017-07-10

## 2017-07-10 MED ORDER — GENERIC EXTERNAL MEDICATION
Status: DC
Start: ? — End: 2017-07-10

## 2017-07-10 MED ORDER — TAMSULOSIN HCL 0.4 MG PO CAPS
.40 | ORAL_CAPSULE | ORAL | Status: DC
Start: 2017-07-09 — End: 2017-07-10

## 2017-07-10 MED ORDER — SODIUM CHLORIDE 0.9 % IV SOLN
INTRAVENOUS | Status: DC
Start: ? — End: 2017-07-10

## 2017-07-10 MED ORDER — METOPROLOL SUCCINATE ER 25 MG PO TB24
50.00 | ORAL_TABLET | ORAL | Status: DC
Start: 2017-07-09 — End: 2017-07-10

## 2017-07-10 MED ORDER — PANTOPRAZOLE SODIUM 40 MG PO TBEC
40.00 | DELAYED_RELEASE_TABLET | ORAL | Status: DC
Start: 2017-07-09 — End: 2017-07-10

## 2017-07-10 MED ORDER — ENOXAPARIN SODIUM 40 MG/0.4ML ~~LOC~~ SOLN
40.00 | SUBCUTANEOUS | Status: DC
Start: 2017-07-08 — End: 2017-07-10

## 2017-07-10 MED ORDER — OXYCODONE HCL 5 MG PO TABS
5.00 | ORAL_TABLET | ORAL | Status: DC
Start: ? — End: 2017-07-10

## 2017-07-10 MED ORDER — BISACODYL 10 MG RE SUPP
10.00 | RECTAL | Status: DC
Start: ? — End: 2017-07-10

## 2017-07-25 DIAGNOSIS — R399 Unspecified symptoms and signs involving the genitourinary system: Secondary | ICD-10-CM | POA: Diagnosis not present

## 2017-07-25 DIAGNOSIS — I10 Essential (primary) hypertension: Secondary | ICD-10-CM | POA: Diagnosis not present

## 2017-07-26 DIAGNOSIS — Z9081 Acquired absence of spleen: Secondary | ICD-10-CM | POA: Diagnosis not present

## 2017-07-26 DIAGNOSIS — R188 Other ascites: Secondary | ICD-10-CM | POA: Diagnosis not present

## 2017-07-26 DIAGNOSIS — Z7982 Long term (current) use of aspirin: Secondary | ICD-10-CM | POA: Diagnosis not present

## 2017-07-26 DIAGNOSIS — Z90410 Acquired total absence of pancreas: Secondary | ICD-10-CM | POA: Diagnosis not present

## 2017-07-26 DIAGNOSIS — Z888 Allergy status to other drugs, medicaments and biological substances status: Secondary | ICD-10-CM | POA: Diagnosis not present

## 2017-07-26 DIAGNOSIS — E785 Hyperlipidemia, unspecified: Secondary | ICD-10-CM | POA: Diagnosis not present

## 2017-07-26 DIAGNOSIS — R1033 Periumbilical pain: Secondary | ICD-10-CM | POA: Diagnosis not present

## 2017-07-26 DIAGNOSIS — K219 Gastro-esophageal reflux disease without esophagitis: Secondary | ICD-10-CM | POA: Diagnosis not present

## 2017-07-26 DIAGNOSIS — Z87891 Personal history of nicotine dependence: Secondary | ICD-10-CM | POA: Diagnosis not present

## 2017-07-26 DIAGNOSIS — Z885 Allergy status to narcotic agent status: Secondary | ICD-10-CM | POA: Diagnosis not present

## 2017-07-26 DIAGNOSIS — Z79899 Other long term (current) drug therapy: Secondary | ICD-10-CM | POA: Diagnosis not present

## 2017-07-26 DIAGNOSIS — R109 Unspecified abdominal pain: Secondary | ICD-10-CM | POA: Diagnosis not present

## 2017-07-26 DIAGNOSIS — L7634 Postprocedural seroma of skin and subcutaneous tissue following other procedure: Secondary | ICD-10-CM | POA: Diagnosis not present

## 2017-07-26 DIAGNOSIS — R110 Nausea: Secondary | ICD-10-CM | POA: Diagnosis not present

## 2017-07-26 DIAGNOSIS — I10 Essential (primary) hypertension: Secondary | ICD-10-CM | POA: Diagnosis not present

## 2017-07-26 MED ORDER — FENTANYL CITRATE (PF) 2500 MCG/50ML IJ SOLN
INTRAMUSCULAR | Status: DC
Start: ? — End: 2017-07-26

## 2017-08-29 HISTORY — PX: SPLENECTOMY, TOTAL: SHX788

## 2017-10-23 ENCOUNTER — Encounter: Payer: Self-pay | Admitting: Sports Medicine

## 2017-10-23 ENCOUNTER — Ambulatory Visit (INDEPENDENT_AMBULATORY_CARE_PROVIDER_SITE_OTHER): Payer: Medicare Other | Admitting: Sports Medicine

## 2017-10-23 DIAGNOSIS — M7061 Trochanteric bursitis, right hip: Secondary | ICD-10-CM | POA: Diagnosis not present

## 2017-10-23 NOTE — Progress Notes (Signed)
Subjective:    I'm seeing this patient as a consultation for: Dr. Emeterio Reeve  CC: Right hip pain  HPI: For the past several weeks this pleasant 77 year old female has had increasing pain in her right hip with radiation down to the lateral thigh, worse laying on the ipsilateral side.  Moderate, persistent.  No trauma, no bowel or bladder dysfunction, saddle numbness, constitutional symptoms.  I reviewed the past medical history, family history, social history, surgical history, and allergies today and no changes were needed.  Please see the problem list section below in epic for further details.  Past Medical History: Past Medical History:  Diagnosis Date  . Arthritis   . Hypertension   . Migraine   . Osteoporosis    Past Surgical History: Past Surgical History:  Procedure Laterality Date  . ABDOMINAL HYSTERECTOMY    . BACK SURGERY    . BREAST SURGERY    . BUNIONECTOMY    . INCISIONAL HERNIA REPAIR    . KNEE ARTHROSCOPY     Social History: Social History   Socioeconomic History  . Marital status: Married    Spouse name: None  . Number of children: None  . Years of education: None  . Highest education level: None  Social Needs  . Financial resource strain: None  . Food insecurity - worry: None  . Food insecurity - inability: None  . Transportation needs - medical: None  . Transportation needs - non-medical: None  Occupational History  . None  Tobacco Use  . Smoking status: Never Smoker  . Smokeless tobacco: Never Used  Substance and Sexual Activity  . Alcohol use: No  . Drug use: No  . Sexual activity: Yes    Birth control/protection: Post-menopausal  Other Topics Concern  . None  Social History Narrative  . None   Family History: Family History  Problem Relation Age of Onset  . Alzheimer's disease Mother   . Heart disease Father   . Cancer Sister   . Diabetes Sister   . Heart disease Brother   . Diabetes Brother   . Cancer Brother     Allergies: Allergies  Allergen Reactions  . Ace Inhibitors Cough  . Atorvastatin     Possible allergy  . Cortisone     Flushing and worsening joint pain  . Losartan Other (See Comments)    Sweats, cramps  . Meloxicam     Too sleepy  . Vicodin [Hydrocodone-Acetaminophen] Other (See Comments)    Pt states that she stops breathing when she takes  Pain meds   Medications: See med rec.  Review of Systems: No headache, visual changes, nausea, vomiting, diarrhea, constipation, dizziness, abdominal pain, skin rash, fevers, chills, night sweats, weight loss, swollen lymph nodes, body aches, joint swelling, muscle aches, chest pain, shortness of breath, mood changes, visual or auditory hallucinations.   Objective:   General: Well Developed, well nourished, and in no acute distress.  Neuro:  Extra-ocular muscles intact, able to move all 4 extremities, sensation grossly intact.  Deep tendon reflexes tested were normal. Psych: Alert and oriented, mood congruent with affect. ENT:  Ears and nose appear unremarkable.  Hearing grossly normal. Neck: Unremarkable overall appearance, trachea midline.  No visible thyroid enlargement. Eyes: Conjunctivae and lids appear unremarkable.  Pupils equal and round. Skin: Warm and dry, no rashes noted.  Cardiovascular: Pulses palpable, no extremity edema. Right hip: ROM IR: 60 Deg, ER: 60 Deg, Flexion: 120 Deg, Extension: 100 Deg, Abduction: 45 Deg, Adduction: 45  Deg Strength IR: 5/5, ER: 5/5, Flexion: 5/5, Extension: 5/5, Abduction: 5/5, Adduction: 5/5 Pelvic alignment unremarkable to inspection and palpation. Standing hip rotation and gait without trendelenburg / unsteadiness. Greater trochanter with severe tenderness to palpation. No tenderness over piriformis. No SI joint tenderness and normal minimal SI movement.  Procedure: Real-time Ultrasound Guided Injection of right greater trochanteric bursa Device: GE Logiq E  Verbal informed consent  obtained.  Time-out conducted.  Noted no overlying erythema, induration, or other signs of local infection.  Skin prepped in a sterile fashion.  Local anesthesia: Topical Ethyl chloride.  With sterile technique and under real time ultrasound guidance: 22-gauge spinal needle advanced into the bursa, I then injected 1 cc Kenalog 40, 2 cc lidocaine, 2 cc bupivacaine. Completed without difficulty  Pain immediately resolved suggesting accurate placement of the medication.  Advised to call if fevers/chills, erythema, induration, drainage, or persistent bleeding.  Images permanently stored and available for review in the ultrasound unit.  Impression: Technically successful ultrasound guided injection.  Impression and Recommendations:   This case required medical decision making of moderate complexity.  Greater trochanteric bursitis of right hip Injection as above. Rehab exercises given. 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 ___________________________________________ Gwen Her. Dianah Field, M.D., ABFM., CAQSM. Primary Care and [place] Instructor of [place] of [facility]

## 2017-10-23 NOTE — Assessment & Plan Note (Signed)
Injection as above. Rehab exercises given. 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

## 2017-10-24 DIAGNOSIS — Z9049 Acquired absence of other specified parts of digestive tract: Secondary | ICD-10-CM | POA: Diagnosis not present

## 2017-10-24 DIAGNOSIS — M1811 Unilateral primary osteoarthritis of first carpometacarpal joint, right hand: Secondary | ICD-10-CM | POA: Diagnosis not present

## 2017-10-24 DIAGNOSIS — Z9081 Acquired absence of spleen: Secondary | ICD-10-CM | POA: Insufficient documentation

## 2017-10-24 DIAGNOSIS — I10 Essential (primary) hypertension: Secondary | ICD-10-CM | POA: Diagnosis not present

## 2017-10-24 DIAGNOSIS — M19041 Primary osteoarthritis, right hand: Secondary | ICD-10-CM | POA: Diagnosis not present

## 2017-10-24 DIAGNOSIS — M818 Other osteoporosis without current pathological fracture: Secondary | ICD-10-CM | POA: Diagnosis not present

## 2017-10-24 DIAGNOSIS — M810 Age-related osteoporosis without current pathological fracture: Secondary | ICD-10-CM | POA: Diagnosis not present

## 2017-10-24 DIAGNOSIS — E559 Vitamin D deficiency, unspecified: Secondary | ICD-10-CM | POA: Diagnosis not present

## 2017-10-24 DIAGNOSIS — Z79899 Other long term (current) drug therapy: Secondary | ICD-10-CM | POA: Diagnosis not present

## 2017-10-24 DIAGNOSIS — Z5181 Encounter for therapeutic drug level monitoring: Secondary | ICD-10-CM | POA: Diagnosis not present

## 2017-11-07 DIAGNOSIS — M818 Other osteoporosis without current pathological fracture: Secondary | ICD-10-CM | POA: Diagnosis not present

## 2017-11-07 DIAGNOSIS — Z23 Encounter for immunization: Secondary | ICD-10-CM | POA: Diagnosis not present

## 2017-11-07 DIAGNOSIS — Z9081 Acquired absence of spleen: Secondary | ICD-10-CM | POA: Diagnosis not present

## 2017-11-20 ENCOUNTER — Other Ambulatory Visit: Payer: Self-pay | Admitting: Osteopathic Medicine

## 2017-11-20 DIAGNOSIS — I10 Essential (primary) hypertension: Secondary | ICD-10-CM

## 2017-11-21 ENCOUNTER — Ambulatory Visit: Payer: Medicare Other | Admitting: Sports Medicine

## 2017-11-21 ENCOUNTER — Ambulatory Visit (INDEPENDENT_AMBULATORY_CARE_PROVIDER_SITE_OTHER): Payer: Medicare Other | Admitting: Sports Medicine

## 2017-11-21 ENCOUNTER — Encounter: Payer: Self-pay | Admitting: Sports Medicine

## 2017-11-21 ENCOUNTER — Ambulatory Visit (INDEPENDENT_AMBULATORY_CARE_PROVIDER_SITE_OTHER): Payer: Medicare Other

## 2017-11-21 DIAGNOSIS — M170 Bilateral primary osteoarthritis of knee: Secondary | ICD-10-CM | POA: Diagnosis not present

## 2017-11-21 DIAGNOSIS — M1711 Unilateral primary osteoarthritis, right knee: Secondary | ICD-10-CM | POA: Diagnosis not present

## 2017-11-21 DIAGNOSIS — M7061 Trochanteric bursitis, right hip: Secondary | ICD-10-CM

## 2017-11-21 DIAGNOSIS — M1712 Unilateral primary osteoarthritis, left knee: Secondary | ICD-10-CM | POA: Diagnosis not present

## 2017-11-21 MED ORDER — CELECOXIB 200 MG PO CAPS: ORAL_CAPSULE | ORAL | 2 refills | Status: DC

## 2017-11-21 NOTE — Progress Notes (Signed)
Subjective:    I'm seeing this patient as a consultation for: Dr. Emeterio Reeve  CC: Bilateral knee pain  HPI: This is a very pleasant 77 year old female, a month ago we injected her greater trochanteric bursa, she returns today pain-free.  She is now complaining of pain in both knees, medial aspect, moderate, persistent without radiation, no mechanical symptoms, significant gelling.  Only taking Tylenol.  No swelling.  I reviewed the past medical history, family history, social history, surgical history, and allergies today and no changes were needed.  Please see the problem list section below in epic for further details.  Past Medical History: Past Medical History:  Diagnosis Date  . Arthritis   . Hypertension   . Migraine   . Osteoporosis    Past Surgical History: Past Surgical History:  Procedure Laterality Date  . ABDOMINAL HYSTERECTOMY    . BACK SURGERY    . BREAST SURGERY    . BUNIONECTOMY    . INCISIONAL HERNIA REPAIR    . KNEE ARTHROSCOPY     Social History: Social History   Socioeconomic History  . Marital status: Married    Spouse name: Not on file  . Number of children: Not on file  . Years of education: Not on file  . Highest education level: Not on file  Occupational History  . Not on file  Social Needs  . Financial resource strain: Not on file  . Food insecurity:    Worry: Not on file    Inability: Not on file  . Transportation needs:    Medical: Not on file    Non-medical: Not on file  Tobacco Use  . Smoking status: Never Smoker  . Smokeless tobacco: Never Used  Substance and Sexual Activity  . Alcohol use: No  . Drug use: No  . Sexual activity: Yes    Birth control/protection: Post-menopausal  Lifestyle  . Physical activity:    Days per week: Not on file    Minutes per session: Not on file  . Stress: Not on file  Relationships  . Social connections:    Talks on phone: Not on file    Gets together: Not on file    Attends  religious service: Not on file    Active member of club or organization: Not on file    Attends meetings of clubs or organizations: Not on file    Relationship status: Not on file  Other Topics Concern  . Not on file  Social History Narrative  . Not on file   Family History: Family History  Problem Relation Age of Onset  . Alzheimer's disease Mother   . Heart disease Father   . Cancer Sister   . Diabetes Sister   . Heart disease Brother   . Diabetes Brother   . Cancer Brother    Allergies: Allergies  Allergen Reactions  . Ace Inhibitors Cough  . Atorvastatin     Possible allergy  . Cortisone     Flushing and worsening joint pain  . Losartan Other (See Comments)    Sweats, cramps  . Meloxicam     Too sleepy  . Vicodin [Hydrocodone-Acetaminophen] Other (See Comments)    Pt states that she stops breathing when she takes  Pain meds   Medications: See med rec.  Review of Systems: No headache, visual changes, nausea, vomiting, diarrhea, constipation, dizziness, abdominal pain, skin rash, fevers, chills, night sweats, weight loss, swollen lymph nodes, body aches, joint swelling, muscle aches, chest pain,  shortness of breath, mood changes, visual or auditory hallucinations.   Objective:   General: Well Developed, well nourished, and in no acute distress.  Neuro:  Extra-ocular muscles intact, able to move all 4 extremities, sensation grossly intact.  Deep tendon reflexes tested were normal. Psych: Alert and oriented, mood congruent with affect. ENT:  Ears and nose appear unremarkable.  Hearing grossly normal. Neck: Unremarkable overall appearance, trachea midline.  No visible thyroid enlargement. Eyes: Conjunctivae and lids appear unremarkable.  Pupils equal and round. Skin: Warm and dry, no rashes noted.  Cardiovascular: Pulses palpable, no extremity edema. Bilateral knees: Tender to palpation at the medial joint line. Overall unremarkable to inspection, no swelling. ROM  normal in flexion and extension and lower leg rotation. Ligaments with solid consistent endpoints including ACL, PCL, LCL, MCL. Negative Mcmurray's and provocative meniscal tests. Non painful patellar compression. Patellar and quadriceps tendons unremarkable. Hamstring and quadriceps strength is normal.  Impression and Recommendations:   This case required medical decision making of moderate complexity.  Greater trochanteric bursitis of right hip Resolved after trochanteric bursa and rehab exercises.  Primary osteoarthritis of both knees Adding Celebrex, home rehab exercises, bilateral knee x-rays. Return to see me in 1 month, steroid plus Viscosupplementation if no better. ___________________________________________ Gwen Her. Dianah Field, M.D., ABFM., CAQSM. Primary Care and [place] Instructor of [place] of [facility] of Medicine

## 2017-11-21 NOTE — Assessment & Plan Note (Signed)
Resolved after trochanteric bursa and rehab exercises.

## 2017-11-21 NOTE — Assessment & Plan Note (Signed)
Adding Celebrex, home rehab exercises, bilateral knee x-rays. Return to see me in 1 month, steroid plus Viscosupplementation if no better.

## 2017-12-04 DIAGNOSIS — K8689 Other specified diseases of pancreas: Secondary | ICD-10-CM | POA: Diagnosis not present

## 2017-12-04 DIAGNOSIS — R109 Unspecified abdominal pain: Secondary | ICD-10-CM | POA: Diagnosis not present

## 2017-12-05 DIAGNOSIS — M810 Age-related osteoporosis without current pathological fracture: Secondary | ICD-10-CM | POA: Diagnosis not present

## 2017-12-05 DIAGNOSIS — Z23 Encounter for immunization: Secondary | ICD-10-CM | POA: Diagnosis not present

## 2017-12-05 DIAGNOSIS — Z9081 Acquired absence of spleen: Secondary | ICD-10-CM | POA: Diagnosis not present

## 2017-12-06 DIAGNOSIS — Z09 Encounter for follow-up examination after completed treatment for conditions other than malignant neoplasm: Secondary | ICD-10-CM | POA: Diagnosis not present

## 2017-12-06 DIAGNOSIS — I10 Essential (primary) hypertension: Secondary | ICD-10-CM | POA: Diagnosis not present

## 2017-12-06 DIAGNOSIS — Q8909 Congenital malformations of spleen: Secondary | ICD-10-CM | POA: Diagnosis not present

## 2017-12-06 DIAGNOSIS — K869 Disease of pancreas, unspecified: Secondary | ICD-10-CM | POA: Diagnosis not present

## 2017-12-06 DIAGNOSIS — R1011 Right upper quadrant pain: Secondary | ICD-10-CM | POA: Diagnosis not present

## 2017-12-06 DIAGNOSIS — Z9889 Other specified postprocedural states: Secondary | ICD-10-CM | POA: Diagnosis not present

## 2017-12-22 ENCOUNTER — Encounter: Payer: Self-pay | Admitting: Sports Medicine

## 2017-12-22 ENCOUNTER — Ambulatory Visit (INDEPENDENT_AMBULATORY_CARE_PROVIDER_SITE_OTHER): Payer: Medicare Other | Admitting: Sports Medicine

## 2017-12-22 DIAGNOSIS — M170 Bilateral primary osteoarthritis of knee: Secondary | ICD-10-CM

## 2017-12-22 NOTE — Progress Notes (Signed)
Subjective:    CC: Follow-up  HPI: This is a pleasant 77 year old female with mild bilateral knee osteoarthritis, we treated her conservatively with simply adding Celebrex at the last visit, as well as some home rehab exercises and she returns for the most part pain-free.  She has only occasional discomfort, and when this occurs the Celebrex seems to knock it out.  She is able to garden, walk, and she opened her pool and is going to start some swimming aerobics.  Happy with how things are going.  I reviewed the past medical history, family history, social history, surgical history, and allergies today and no changes were needed.  Please see the problem list section below in epic for further details.  Past Medical History: Past Medical History:  Diagnosis Date  . Arthritis   . Hypertension   . Migraine   . Osteoporosis    Past Surgical History: Past Surgical History:  Procedure Laterality Date  . ABDOMINAL HYSTERECTOMY    . BACK SURGERY    . BREAST SURGERY    . BUNIONECTOMY    . INCISIONAL HERNIA REPAIR    . KNEE ARTHROSCOPY     Social History: Social History   Socioeconomic History  . Marital status: Married    Spouse name: Not on file  . Number of children: Not on file  . Years of education: Not on file  . Highest education level: Not on file  Occupational History  . Not on file  Social Needs  . Financial resource strain: Not on file  . Food insecurity:    Worry: Not on file    Inability: Not on file  . Transportation needs:    Medical: Not on file    Non-medical: Not on file  Tobacco Use  . Smoking status: Never Smoker  . Smokeless tobacco: Never Used  Substance and Sexual Activity  . Alcohol use: No  . Drug use: No  . Sexual activity: Yes    Birth control/protection: Post-menopausal  Lifestyle  . Physical activity:    Days per week: Not on file    Minutes per session: Not on file  . Stress: Not on file  Relationships  . Social connections:    Talks on  phone: Not on file    Gets together: Not on file    Attends religious service: Not on file    Active member of club or organization: Not on file    Attends meetings of clubs or organizations: Not on file    Relationship status: Not on file  Other Topics Concern  . Not on file  Social History Narrative  . Not on file   Family History: Family History  Problem Relation Age of Onset  . Alzheimer's disease Mother   . Heart disease Father   . Cancer Sister   . Diabetes Sister   . Heart disease Brother   . Diabetes Brother   . Cancer Brother    Allergies: Allergies  Allergen Reactions  . Ace Inhibitors Cough  . Atorvastatin     Possible allergy  . Cortisone     Flushing and worsening joint pain  . Losartan Other (See Comments)    Sweats, cramps  . Meloxicam     Too sleepy  . Vicodin [Hydrocodone-Acetaminophen] Other (See Comments)    Pt states that she stops breathing when she takes  Pain meds   Medications: See med rec.  Review of Systems: No fevers, chills, night sweats, weight loss, chest pain, or shortness  of breath.   Objective:    General: Well Developed, well nourished, and in no acute distress.  Neuro: Alert and oriented x3, extra-ocular muscles intact, sensation grossly intact.  HEENT: Normocephalic, atraumatic, pupils equal round reactive to light, neck supple, no masses, no lymphadenopathy, thyroid nonpalpable.  Skin: Warm and dry, no rashes. Cardiac: Regular rate and rhythm, no murmurs rubs or gallops, no lower extremity edema.  Respiratory: Clear to auscultation bilaterally. Not using accessory muscles, speaking in full sentences.  Impression and Recommendations:    Primary osteoarthritis of both knees Mild osteoarthritis, now well controlled with Celebrex, home rehab exercises. If she has a recurrence of pain we will do a steroid injection plus viscosupplementation. ___________________________________________ Gwen Her. Dianah Field, M.D., ABFM.,  CAQSM. Primary Care and [place] Instructor of [place] of [facility] of Medicine

## 2017-12-22 NOTE — Assessment & Plan Note (Signed)
Mild osteoarthritis, now well controlled with Celebrex, home rehab exercises. If she has a recurrence of pain we will do a steroid injection plus viscosupplementation.

## 2018-01-01 ENCOUNTER — Ambulatory Visit (INDEPENDENT_AMBULATORY_CARE_PROVIDER_SITE_OTHER): Payer: Medicare Other | Admitting: Physician Assistant

## 2018-01-01 ENCOUNTER — Encounter: Payer: Self-pay | Admitting: Physician Assistant

## 2018-01-01 VITALS — BP 153/77 | HR 70 | Temp 97.8°F | Wt 102.0 lb

## 2018-01-01 DIAGNOSIS — N760 Acute vaginitis: Secondary | ICD-10-CM | POA: Diagnosis not present

## 2018-01-01 MED ORDER — FLUCONAZOLE 150 MG PO TABS: 150.0000 mg | ORAL_TABLET | ORAL | 0 refills | Status: AC

## 2018-01-01 NOTE — Patient Instructions (Addendum)
- Take 1 tablet today. Wait 48 hours. If still having itching/discharge, take additional tablet. Wait for 48 hours. Only take 3rd tablet if still having symptoms. - eat yogurt with live and active cultures such as Kefir or Activia. Or take a Culturelle probiotic   Vaginal Yeast infection, Adult Vaginal yeast infection is a condition that causes soreness, swelling, and redness (inflammation) of the vagina. It also causes vaginal discharge. This is a common condition. Some women get this infection frequently. What are the causes? This condition is caused by a change in the normal balance of the yeast (candida) and bacteria that live in the vagina. This change causes an overgrowth of yeast, which causes the inflammation. What increases the risk? This condition is more likely to develop in:  Women who take antibiotic medicines.  Women who have diabetes.  Women who take birth control pills.  Women who are pregnant.  Women who douche often.  Women who have a weak defense (immune) system.  Women who have been taking steroid medicines for a long time.  Women who frequently wear tight clothing.  What are the signs or symptoms? Symptoms of this condition include:  White, thick vaginal discharge.  Swelling, itching, redness, and irritation of the vagina. The lips of the vagina (vulva) may be affected as well.  Pain or a burning feeling while urinating.  Pain during sex.  How is this diagnosed? This condition is diagnosed with a medical history and physical exam. This will include a pelvic exam. Your health care provider will examine a sample of your vaginal discharge under a microscope. Your health care provider may send this sample for testing to confirm the diagnosis. How is this treated? This condition is treated with medicine. Medicines may be over-the-counter or prescription. You may be told to use one or more of the following:  Medicine that is taken orally.  Medicine that is  applied as a cream.  Medicine that is inserted directly into the vagina (suppository).  Follow these instructions at home:  Take or apply over-the-counter and prescription medicines only as told by your health care provider.  Do not have sex until your health care provider has approved. Tell your sex partner that you have a yeast infection. That person should go to his or her health care provider if he or she develops symptoms.  Do not wear tight clothes, such as pantyhose or tight pants.  Avoid using tampons until your health care provider approves.  Eat more yogurt. This may help to keep your yeast infection from returning.  Try taking a sitz bath to help with discomfort. This is a warm water bath that is taken while you are sitting down. The water should only come up to your hips and should cover your buttocks. Do this 3-4 times per day or as told by your health care provider.  Do not douche.  Wear breathable, cotton underwear.  If you have diabetes, keep your blood sugar levels under control. Contact a health care provider if:  You have a fever.  Your symptoms go away and then return.  Your symptoms do not get better with treatment.  Your symptoms get worse.  You have new symptoms.  You develop blisters in or around your vagina.  You have blood coming from your vagina and it is not your menstrual period.  You develop pain in your abdomen. This information is not intended to replace advice given to you by your health care provider. Make sure you discuss  any questions you have with your health care provider. Document Released: 05/25/2005 Document Revised: 01/27/2016 Document Reviewed: 02/16/2015 Elsevier Interactive Patient Education  2018 [employer].

## 2018-01-01 NOTE — Progress Notes (Signed)
HPI:                                                                Mercedes Decker is a 77 y.o. female who presents to [facility] today for vaginitis  Vaginal Itching  The patient's primary symptoms include genital itching and vaginal discharge. The patient's pertinent negatives include no genital lesions, genital odor, genital rash or pelvic pain. This is a new problem. The current episode started in the past 7 days. The problem occurs constantly. The problem has been unchanged. The patient is experiencing no pain. She is not pregnant. Pertinent negatives include no abdominal pain, chills, discolored urine, fever, flank pain, frequency, hematuria, rash or urgency. Nothing aggravates the symptoms. She has tried antifungals for the symptoms. The treatment provided mild relief. She is sexually active. No, her partner does not have an STD. Her past medical history is significant for vaginosis.     Depression screen [facility] 2/9 05/08/2017 06/25/2014 09/03/2012  Decreased Interest 0 0 0  Down, Depressed, Hopeless 0 0 0  PHQ - 2 Score 0 0 0    No flowsheet data found.    Past Medical History:  Diagnosis Date  . Arthritis   . Hypertension   . Migraine   . Osteoporosis    Past Surgical History:  Procedure Laterality Date  . ABDOMINAL HYSTERECTOMY    . BACK SURGERY    . BREAST SURGERY    . BUNIONECTOMY    . INCISIONAL HERNIA REPAIR    . KNEE ARTHROSCOPY     Social History   Tobacco Use  . Smoking status: Never Smoker  . Smokeless tobacco: Never Used  Substance Use Topics  . Alcohol use: No   family history includes Alzheimer's disease in her mother; Cancer in her brother and sister; Diabetes in her brother and sister; Heart disease in her brother and father.    ROS: negative except as noted in the HPI  Medications: Current Outpatient Medications  Medication Sig Dispense Refill  . amLODipine (NORVASC) 5 MG tablet Take 1 tablet  (5 mg total) by mouth daily. 90 tablet 0  . aspirin EC 81 MG tablet Take 1 tablet (81 mg total) by mouth daily.    Marland Kitchen atorvastatin (LIPITOR) 20 MG tablet Take 1 tablet (20 mg total) by mouth daily. 90 tablet 1  . CALCIUM-MAGNESIUM-ZINC PO Take by mouth.    . celecoxib (CELEBREX) 200 MG capsule One to 2 tablets by mouth daily as needed for pain. 60 capsule 2  . cephALEXin (KEFLEX) 500 MG capsule Take 1 capsule (500 mg total) by mouth 2 (two) times daily. 14 capsule 0  . cholecalciferol (VITAMIN D) 1000 UNITS tablet Take 2,000 Units by mouth daily.    . Cyanocobalamin (VITAMIN B 12 PO) Take by mouth.    . denosumab (PROLIA) 60 MG/ML SOLN injection Inject 60 mg into the skin every 6 (six) months. Administer in upper arm, thigh, or abdomen    . ipratropium (ATROVENT) 0.03 % nasal spray Place 2 sprays into both nostrils 4 (four) times daily as needed for rhinitis. 30 mL 0  . metoprolol succinate (TOPROL-XL) 50 MG 24 hr tablet Take 1 tablet (50 mg total) by mouth daily. Take with or  immediately following a meal. 90 tablet 1  . SUMAtriptan 6 MG/0.5ML SOAJ Inject 1 Syringe into the skin once. Inject 6 mg into skin at onset of headache one time 1 Syringe 3  . vitamin C (ASCORBIC ACID) 500 MG tablet Take 500 mg by mouth daily.    . fluconazole (DIFLUCAN) 150 MG tablet Take 1 tablet (150 mg total) by mouth every other day for 3 doses. 3 tablet 0   No current facility-administered medications for this visit.    Allergies  Allergen Reactions  . Ace Inhibitors Cough  . Atorvastatin     Possible allergy  . Cortisone     Flushing and worsening joint pain  . Losartan Other (See Comments)    Sweats, cramps  . Meloxicam     Too sleepy  . Vicodin [Hydrocodone-Acetaminophen] Other (See Comments)    Pt states that she stops breathing when she takes  Pain meds       Objective:  BP (!) 153/77   Pulse 70   Temp 97.8 F (36.6 C) (Oral)   Wt 102 lb (46.3 kg)   BMI 21.32 kg/m  Gen:  alert, not  ill-appearing, no distress, appropriate for age HEENT: head normocephalic without obvious abnormality, conjunctiva and cornea clear, wearing glasses, trachea midline Pulm: Normal work of breathing, normal phonation Neuro: alert and oriented x 3 GU: deferred MSK: extremities atraumatic, normal gait and station Skin: intact, no rashes on exposed skin, no jaundice, no cyanosis Psych: well-groomed, cooperative, good eye contact, euthymic mood, affect mood-congruent, speech is articulate, and thought processes clear and goal-directed    No results found for this or any previous visit (from the past 72 hour(s)). No results found.    Assessment and Plan: 77 y.o. female with   1. Acute vaginitis - 1 week of vaginal itching preceded by antibiotic use. No high risk sexual behaviors. GU exam deferred. Treating empirically with Fluconazole 150 mg every 2 days for total of 3 doses if needed. Probiotic   Patient education and anticipatory guidance given Patient agrees with treatment plan Follow-up as needed if symptoms worsen or fail to improve  Darlyne Russian PA-C

## 2018-01-02 DIAGNOSIS — M818 Other osteoporosis without current pathological fracture: Secondary | ICD-10-CM | POA: Diagnosis not present

## 2018-01-02 DIAGNOSIS — Z23 Encounter for immunization: Secondary | ICD-10-CM | POA: Diagnosis not present

## 2018-01-02 DIAGNOSIS — Z9081 Acquired absence of spleen: Secondary | ICD-10-CM | POA: Diagnosis not present

## 2018-01-26 DIAGNOSIS — M7741 Metatarsalgia, right foot: Secondary | ICD-10-CM | POA: Diagnosis not present

## 2018-01-26 DIAGNOSIS — M19071 Primary osteoarthritis, right ankle and foot: Secondary | ICD-10-CM | POA: Diagnosis not present

## 2018-01-26 DIAGNOSIS — M779 Enthesopathy, unspecified: Secondary | ICD-10-CM | POA: Diagnosis not present

## 2018-01-26 DIAGNOSIS — I10 Essential (primary) hypertension: Secondary | ICD-10-CM | POA: Diagnosis not present

## 2018-01-30 ENCOUNTER — Other Ambulatory Visit: Payer: Self-pay

## 2018-01-30 DIAGNOSIS — I10 Essential (primary) hypertension: Secondary | ICD-10-CM

## 2018-01-30 MED ORDER — AMLODIPINE BESYLATE 5 MG PO TABS: 5.0000 mg | ORAL_TABLET | Freq: Every day | ORAL | 0 refills | Status: DC

## 2018-02-16 ENCOUNTER — Ambulatory Visit (INDEPENDENT_AMBULATORY_CARE_PROVIDER_SITE_OTHER): Payer: Medicare Other

## 2018-02-16 ENCOUNTER — Ambulatory Visit (INDEPENDENT_AMBULATORY_CARE_PROVIDER_SITE_OTHER): Payer: Medicare Other | Admitting: Sports Medicine

## 2018-02-16 ENCOUNTER — Encounter: Payer: Self-pay | Admitting: Sports Medicine

## 2018-02-16 DIAGNOSIS — W19XXXA Unspecified fall, initial encounter: Secondary | ICD-10-CM

## 2018-02-16 DIAGNOSIS — M4186 Other forms of scoliosis, lumbar region: Secondary | ICD-10-CM | POA: Diagnosis not present

## 2018-02-16 DIAGNOSIS — M533 Sacrococcygeal disorders, not elsewhere classified: Secondary | ICD-10-CM | POA: Diagnosis not present

## 2018-02-16 DIAGNOSIS — M545 Low back pain: Secondary | ICD-10-CM | POA: Diagnosis not present

## 2018-02-16 DIAGNOSIS — M25571 Pain in right ankle and joints of right foot: Secondary | ICD-10-CM | POA: Diagnosis not present

## 2018-02-16 DIAGNOSIS — S3992XA Unspecified injury of lower back, initial encounter: Secondary | ICD-10-CM | POA: Diagnosis not present

## 2018-02-16 NOTE — Progress Notes (Signed)
Pt has seen results on MyChart and message also sent for patient to call back if any questions.

## 2018-02-16 NOTE — Assessment & Plan Note (Signed)
Right lateral ankle sprain with negative [place] ankle rules. Tenderness over the right sacrum. Getting lumbar spine and sacrococcygeal x-rays. Continue Celebrex for pain, return to see me as needed.

## 2018-02-16 NOTE — Progress Notes (Signed)
Subjective:    I'm seeing this patient as a consultation for: Dr. Emeterio Reeve  CC: Fall  HPI: This is a pleasant 77 year old female, she recently had a slip and fall, inverted her right ankle and fell on her right hip and buttock.  Right ankle swelling has improved, she does have a bit of pain over the lateral ankle, but was able to bear weight immediately after the injury.  She continues to have moderate to severe pain at her right sacrum, no radiation down the leg currently.  No bowel or bladder dysfunction, saddle numbness, constitutional symptoms.  I reviewed the past medical history, family history, social history, surgical history, and allergies today and no changes were needed.  Please see the problem list section below in epic for further details.  Past Medical History: Past Medical History:  Diagnosis Date  . Arthritis   . Hypertension   . Migraine   . Osteoporosis    Past Surgical History: Past Surgical History:  Procedure Laterality Date  . ABDOMINAL HYSTERECTOMY    . BACK SURGERY    . BREAST SURGERY    . BUNIONECTOMY    . INCISIONAL HERNIA REPAIR    . KNEE ARTHROSCOPY     Social History: Social History   Socioeconomic History  . Marital status: Married    Spouse name: Not on file  . Number of children: Not on file  . Years of education: Not on file  . Highest education level: Not on file  Occupational History  . Not on file  Social Needs  . Financial resource strain: Not on file  . Food insecurity:    Worry: Not on file    Inability: Not on file  . Transportation needs:    Medical: Not on file    Non-medical: Not on file  Tobacco Use  . Smoking status: Never Smoker  . Smokeless tobacco: Never Used  Substance and Sexual Activity  . Alcohol use: No  . Drug use: No  . Sexual activity: Yes    Birth control/protection: Post-menopausal  Lifestyle  . Physical activity:    Days per week: Not on file    Minutes per session: Not on file  .  Stress: Not on file  Relationships  . Social connections:    Talks on phone: Not on file    Gets together: Not on file    Attends religious service: Not on file    Active member of club or organization: Not on file    Attends meetings of clubs or organizations: Not on file    Relationship status: Not on file  Other Topics Concern  . Not on file  Social History Narrative  . Not on file   Family History: Family History  Problem Relation Age of Onset  . Alzheimer's disease Mother   . Heart disease Father   . Cancer Sister   . Diabetes Sister   . Heart disease Brother   . Diabetes Brother   . Cancer Brother    Allergies: Allergies  Allergen Reactions  . Ace Inhibitors Cough  . Atorvastatin     Possible allergy  . Cortisone     Flushing and worsening joint pain  . Losartan Other (See Comments)    Sweats, cramps  . Meloxicam     Too sleepy  . Vicodin [Hydrocodone-Acetaminophen] Other (See Comments)    Pt states that she stops breathing when she takes  Pain meds   Medications: See med rec.  Review of  Systems: No headache, visual changes, nausea, vomiting, diarrhea, constipation, dizziness, abdominal pain, skin rash, fevers, chills, night sweats, weight loss, swollen lymph nodes, body aches, joint swelling, muscle aches, chest pain, shortness of breath, mood changes, visual or auditory hallucinations.   Objective:   General: Well Developed, well nourished, and in no acute distress.  Neuro:  Extra-ocular muscles intact, able to move all 4 extremities, sensation grossly intact.  Deep tendon reflexes tested were normal. Psych: Alert and oriented, mood congruent with affect. ENT:  Ears and nose appear unremarkable.  Hearing grossly normal. Neck: Unremarkable overall appearance, trachea midline.  No visible thyroid enlargement. Eyes: Conjunctivae and lids appear unremarkable.  Pupils equal and round. Skin: Warm and dry, no rashes noted.  Cardiovascular: Pulses palpable, no  extremity edema. Back Exam:  Inspection: Unremarkable  Motion: Flexion 45 deg, Extension 45 deg, Side Bending to 45 deg bilaterally,  Rotation to 45 deg bilaterally  SLR laying: Negative  XSLR laying: Negative  Palpable tenderness: Right hemisacrum. FABER: negative. Sensory change: Gross sensation intact to all lumbar and sacral dermatomes.  Reflexes: 2+ at both patellar tendons, 2+ at achilles tendons, Babinski's downgoing.  Strength at foot  Plantar-flexion: 5/5 Dorsi-flexion: 5/5 Eversion: 5/5 Inversion: 5/5  Leg strength  Quad: 5/5 Hamstring: 5/5 Hip flexor: 5/5 Hip abductors: 5/5  Gait unremarkable. Right ankle: No visible erythema or swelling. Range of motion is full in all directions. Strength is 5/5 in all directions. Stable lateral and medial ligaments; squeeze test and kleiger test unremarkable; Talar dome nontender; No pain at base of 5th MT; No tenderness over cuboid; No tenderness over N spot or navicular prominence No tenderness on posterior aspects of lateral and medial malleolus No sign of peroneal tendon subluxations; Negative tarsal tunnel tinel's Able to walk 4 steps. Tender to palpation over the ATFL and CFL, reproduction of pain with anterior drawer test.  Sacrum, coccyx, lumbar spine x-ray reviewed, moderate degenerative changes but no acute fractures visualized.  Impression and Recommendations:   This case required medical decision making of moderate complexity.  Fall Right lateral ankle sprain with negative [place] ankle rules. Tenderness over the right sacrum. Getting lumbar spine and sacrococcygeal x-rays. Continue Celebrex for pain, return to see me as needed. ___________________________________________ Gwen Her. Dianah Field, M.D., ABFM., CAQSM. Primary Care and [place] Instructor of [place] [facility]

## 2018-03-21 ENCOUNTER — Other Ambulatory Visit: Payer: Self-pay

## 2018-03-21 ENCOUNTER — Emergency Department (INDEPENDENT_AMBULATORY_CARE_PROVIDER_SITE_OTHER)
Admission: EM | Admit: 2018-03-21 | Discharge: 2018-03-21 | Disposition: A | Discharge status: Home / Self Care | Payer: Medicare Other | Source: Home / Self Care | Attending: Family Medicine | Admitting: Family Medicine

## 2018-03-21 ENCOUNTER — Emergency Department (INDEPENDENT_AMBULATORY_CARE_PROVIDER_SITE_OTHER): Payer: Medicare Other

## 2018-03-21 ENCOUNTER — Ambulatory Visit (INDEPENDENT_AMBULATORY_CARE_PROVIDER_SITE_OTHER): Payer: Medicare Other | Admitting: Family Medicine

## 2018-03-21 ENCOUNTER — Encounter: Payer: Self-pay | Admitting: *Deleted

## 2018-03-21 DIAGNOSIS — W19XXXA Unspecified fall, initial encounter: Secondary | ICD-10-CM

## 2018-03-21 DIAGNOSIS — S59202A Unspecified physeal fracture of lower end of radius, left arm, initial encounter for closed fracture: Secondary | ICD-10-CM | POA: Diagnosis not present

## 2018-03-21 DIAGNOSIS — S52592A Other fractures of lower end of left radius, initial encounter for closed fracture: Secondary | ICD-10-CM | POA: Diagnosis not present

## 2018-03-21 DIAGNOSIS — S52502A Unspecified fracture of the lower end of left radius, initial encounter for closed fracture: Secondary | ICD-10-CM

## 2018-03-21 DIAGNOSIS — S52532A Colles' fracture of left radius, initial encounter for closed fracture: Secondary | ICD-10-CM

## 2018-03-21 NOTE — ED Provider Notes (Signed)
[facility]    CSN: [id number] Arrival date & time: 03/21/18  4098     History   Chief Complaint Chief Complaint  Patient presents with  . Wrist Pain  . Arm Pain    HPI Mercedes [facility]cker is a 77 y.o. female.   While standing on a stool in a closet this morning, patient fell backwards, bracing herself with her left hand.  She had immediate pain/swelling in her left wrist radiating to her left elbow.  The history is provided by the patient.  Wrist Pain  This is a new problem. Episode onset: one hour ago. The problem occurs constantly. The problem has not changed since onset.Associated symptoms comments: none. Exacerbated by: movement. She has tried nothing for the symptoms.  Arm Pain     Past Medical History:  Diagnosis Date  . Arthritis   . Hypertension   . Migraine   . Osteoporosis     Patient Active Problem List   Diagnosis Date Noted  . Fall 02/16/2018  . Primary osteoarthritis of both knees 11/21/2017  . Greater trochanteric bursitis of right hip 10/23/2017  . Pancreatic tumor 05/08/2017  . Hoarseness 02/10/2017  . Right shoulder injury 08/18/2016  . TIA (transient ischemic attack) 03/08/2016  . Right hand pain 03/07/2016  . Patient has active power of attorney for health care 11/30/2015  . Essential hypertension 12/25/2014  . Murmur, cardiac 09/11/2012  . DJD (degenerative joint disease) 09/03/2012  . S/P hysterectomy 09/03/2012  . Osteoporosis  L fem neck  Dexa 07/2014  -3.1 09/03/2012  . Migraine 09/03/2012  . History of anemia 09/03/2012  . Hyperlipidemia 09/03/2012  . GERD (gastroesophageal reflux disease) 09/03/2012    Past Surgical History:  Procedure Laterality Date  . ABDOMINAL HYSTERECTOMY    . BACK SURGERY    . BREAST SURGERY    . BUNIONECTOMY    . INCISIONAL HERNIA REPAIR    . KNEE ARTHROSCOPY      OB History   None      Home Medications    Prior to Admission medications   Medication Sig Start Date End Date  Taking? Authorizing Provider  amLODipine (NORVASC) 5 MG tablet Take 1 tablet (5 mg total) by mouth daily. Pt needs f/u appt w/PCP for further refills. 01/30/18   Emeterio Reeve, DO  aspirin EC 81 MG tablet Take 1 tablet (81 mg total) by mouth daily. 11/30/15   Hommel, Hilliard Clark, DO  atorvastatin (LIPITOR) 20 MG tablet Take 1 tablet (20 mg total) by mouth daily. 03/09/16   Hommel, Hilliard Clark, DO  CALCIUM-MAGNESIUM-ZINC PO Take by mouth.    [provider]  celecoxib (CELEBREX) 200 MG capsule One to 2 tablets by mouth daily as needed for pain. 11/21/17   Silverio Decamp, MD  cholecalciferol (VITAMIN D) 1000 UNITS tablet Take 2,000 Units by mouth daily.    [provider]  Cyanocobalamin (VITAMIN B 12 PO) Take by mouth.    [provider]  denosumab (PROLIA) 60 MG/ML SOLN injection Inject 60 mg into the skin every 6 (six) months. Administer in upper arm, thigh, or abdomen    [provider]  ipratropium (ATROVENT) 0.03 % nasal spray Place 2 sprays into both nostrils 4 (four) times daily as needed for rhinitis. 10/03/16   Emeterio Reeve, DO  metoprolol succinate (TOPROL-XL) 50 MG 24 hr tablet Take 1 tablet (50 mg total) by mouth daily. Take with or immediately following a meal. 11/20/17   Emeterio Reeve, DO  SUMAtriptan  6 MG/0.5ML SOAJ Inject 1 Syringe into the skin once. Inject 6 mg into skin at onset of headache one time 04/03/13   Schoenhoff, Altamese Cabal, MD  vitamin C (ASCORBIC ACID) 500 MG tablet Take 500 mg by mouth daily.    [provider]    Family History Family History  Problem Relation Age of Onset  . Alzheimer's disease Mother   . Heart disease Father   . Cancer Sister   . Diabetes Sister   . Heart disease Brother   . Diabetes Brother   . Cancer Brother     Social History Social History   Tobacco Use  . Smoking status: Never Smoker  . Smokeless tobacco: Never Used  Substance Use Topics  . Alcohol use: No  . Drug use: No      Allergies   Ace inhibitors; Atorvastatin; Cortisone; Losartan; Meloxicam; and Vicodin [hydrocodone-acetaminophen]   Review of Systems Review of Systems  Neurological: Negative for dizziness, syncope, weakness and light-headedness.  All other systems reviewed and are negative.    Physical Exam Triage Vital Signs ED Triage Vitals  Enc Vitals Group     BP 03/21/18 1005 119/69     Pulse Rate 03/21/18 1005 65     Resp 03/21/18 1005 20     Temp --      Temp src --      SpO2 03/21/18 1005 99 %     Weight 03/21/18 1006 103 lb (46.7 kg)     Height --      Head Circumference --      Peak Flow --      Pain Score 03/21/18 1006 10     Pain Loc --      Pain Edu? --      Excl. in [place]? --    No data found.  Updated Vital Signs BP 119/69 (BP Location: Right Arm)   Pulse 65   Resp 20   Wt 103 lb (46.7 kg)   SpO2 99%   BMI 21.53 kg/m   Visual Acuity Right Eye Distance:   Left Eye Distance:   Bilateral Distance:    Right Eye Near:   Left Eye Near:    Bilateral Near:     Physical Exam  Constitutional: She appears well-developed and well-nourished. She appears distressed.  HENT:  Head: Atraumatic.  Right Ear: External ear normal.  Left Ear: External ear normal.  Nose: Nose normal.  Eyes: Pupils are equal, round, and reactive to light. Conjunctivae are normal.  Neck: Normal range of motion.  Cardiovascular: Normal rate.  Pulmonary/Chest: Effort normal.  Musculoskeletal:       Left wrist: She exhibits decreased range of motion, tenderness, bony tenderness, swelling and deformity.  Distal neurovascular function is intact.   Neurological: She is alert.  Skin: Skin is warm and dry. She is not diaphoretic.  Nursing note and vitals reviewed.    UC Treatments / Results  Labs (all labs ordered are listed, but only abnormal results are displayed) Labs Reviewed - No data to display  EKG None  Radiology Dg Wrist 2 Views Left  Result Date: 03/21/2018 CLINICAL  DATA:  Distal radius fracture.  Postreduction radiographs. EXAM: LEFT WRIST - 2 VIEW 11:54 a.m. COMPARISON:  03/21/2018 10:14 a.m. FINDINGS: The patient has undergone closed reduction of the impacted comminuted fracture of the distal left radius. There is slight residual dorsal angulation and displacement but this has been improved. IMPRESSION: Improved angulation and displacement of the impacted fracture of  the distal left radius. Electronically Signed   By: Lorriane Shire M.D.   On: 03/21/2018 12:00   Dg Wrist Complete Left  Result Date: 03/21/2018 CLINICAL DATA:  Pain following fall EXAM: LEFT WRIST - COMPLETE 3+ VIEW COMPARISON:  None. FINDINGS: Frontal, oblique, lateral, and ulnar deviation scaphoid images were obtained. There is a comminuted fracture of the distal radial metaphysis with dorsal angulation distally. There is impaction at the fracture site. Fracture fragment extends to the radiocarpal joint. No other fractures are evident. No dislocation. There is calcification lateral to the distal scaphoid, likely of arthropathic etiology. Bones are osteoporotic. No erosive changes. IMPRESSION: Comminuted fracture distal radial metaphysis with dorsal angulation distally. There is impaction at fracture site. No other fractures are evident. No dislocation. There is arthropathy laterally. Bones are osteoporotic. Electronically Signed   By: Lowella Grip III M.D.   On: 03/21/2018 10:36    Procedures Procedures (including critical care time)  Medications Ordered in UC Medications - No data to display  Initial Impression / Assessment and Plan / UC Course  I have reviewed the triage vital signs and the nursing notes.  Pertinent labs & imaging results that were available during my care of the patient were reviewed by me and considered in my medical decision making (see chart for details).    Administered Toradol 30mg  IM. Patient referred to Dr. Georgina Snell for fracture management and  follow-up.  Final Clinical Impressions(s) / UC Diagnoses   Final diagnoses:  Closed traumatic displaced fracture of distal end of left radius, initial encounter     Discharge Instructions     Follow instructions as per Dr. Georgina Snell.    ED Prescriptions    None        Kandra Nicolas, MD 03/21/18 1207

## 2018-03-21 NOTE — Patient Instructions (Addendum)
Thank you for coming in today. Recheck as schedule on 7/31 at 1130 Take easy.  Use the sling as needed.  Let me know if you have a problem with the cast.   Use Celebrex for pain as needed.     Cast or Splint Care, Adult Casts and splints are supports that are worn to protect broken bones and other injuries. A cast or splint may hold a bone still and in the correct position while it heals. Casts and splints may also help to ease pain, swelling, and muscle spasms. How to care for your cast  Do not stick anything inside the cast to scratch your skin.  Check the skin around the cast every day. Tell your doctor about any concerns.  You may put lotion on dry skin around the edges of the cast. Do not put lotion on the skin under the cast.  Keep the cast clean.  If the cast is not waterproof: ? Do not let it get wet. ? Cover it with a watertight covering when you take a bath or a shower. How to care for your splint  Wear it as told by your doctor. Take it off only as told by your doctor.  Loosen the splint if your fingers or toes tingle, get numb, or turn cold and blue.  Keep the splint clean.  If the splint is not waterproof: ? Do not let it get wet. ? Cover it with a watertight covering when you take a bath or a shower. Follow these instructions at home: Bathing  Do not take baths or swim until your doctor says it is okay. Ask your doctor if you can take showers. You may only be allowed to take sponge baths for bathing.  If your cast or splint is not waterproof, cover it with a watertight covering when you take a bath or shower. Managing pain, stiffness, and swelling  Move your fingers or toes often to avoid stiffness and to lessen swelling.  Raise (elevate) the injured area above the level of your heart while sitting or lying down. Safety  Do not use the injured limb to support your body weight until your doctor says that it is okay.  Use crutches or other assistive  devices as told by your doctor. General instructions  Do not put pressure on any part of the cast or splint until it is fully hardened. This may take many hours.  Return to your normal activities as told by your doctor. Ask your doctor what activities are safe for you.  Keep all follow-up visits as told by your doctor. This is important. Contact a doctor if:  Your cast or splint gets damaged.  The skin around the cast gets red or raw.  The skin under the cast is very itchy or painful.  Your cast or splint feels very uncomfortable.  Your cast or splint is too tight or too loose.  Your cast becomes wet or it starts to have a soft spot or area.  You get an object stuck under your cast. Get help right away if:  Your pain gets worse.  The injured area tingles, gets numb, or turns blue and cold.  The part of your body above or below the cast is swollen and it turns a different color (is discolored).  You cannot feel or move your fingers or toes.  There is fluid leaking through the cast.  You have very bad pain or pressure under the cast.  You have trouble  breathing.  You have shortness of breath.  You have chest pain. This information is not intended to replace advice given to you by your health care provider. Make sure you discuss any questions you have with your health care provider. Document Released: 12/15/2010 Document Revised: 08/05/2016 Document Reviewed: 08/05/2016 Elsevier Interactive Patient Education  2017 [employer].

## 2018-03-21 NOTE — ED Triage Notes (Addendum)
Patient reports falling backwards from a stool in her closet this AM. She tried to catch herself with her left hand. C/o pain from left hand up to her elbow. Applied ice and a sling.

## 2018-03-21 NOTE — Discharge Instructions (Signed)
Follow instructions as per Dr. Corey. °

## 2018-03-21 NOTE — Progress Notes (Signed)
Mercedes Decker is a 77 y.o. female who presents to [facility] today for left distal radius fracture.  Patient fell today landing on her outstretched left wrist.  She fell off of a step stool trying to get an object off a high shelf.  She developed immediate pain and deformity.  She was seen in urgent care where she was diagnosed with an angulated distal radius fracture.  She denies any radiating pain or numbness distally.  No fevers or chills.    ROS:  As above  Exam:  BP 119/69 (BP Location: Right Arm)   Pulse 65   Resp 20   Wt 103 lb (46.7 kg)   SpO2 99%   BMI 21.53 kg/m (patient seen in urgent care.  Vital signs not changed.) General: Well Developed, well nourished, and in no acute distress.  Neuro/Psych: Alert and oriented x3, extra-ocular muscles intact, able to move all 4 extremities, sensation grossly intact. Skin: Warm and dry, no rashes noted.  Respiratory: Not using accessory muscles, speaking in full sentences, trachea midline.  Cardiovascular: Pulses palpable, no extremity edema. Abdomen: Does not appear distended. MSK: Left wrist: Dorsal angulation of the distal radius with deformity present.  Pulses capillary fill and sensation and strength intact distally.  Elbow is nontender with normal motion.  Hematoma block: Consent obtained and timeout performed. Skin overlying wrist cleaned with rubbing alcohol. Using ultrasound guidance a 25-gauge needle was used to access the dorsal hematoma and 7 mL of a 50-50 lidocaine Marcaine mix was injected into the hematoma achieving excellent anesthesia.  Reduction of fracture: Consent obtained and timeout performed. After allowing 15 minutes following the hematoma block to achieve maximum analgesia traction was applied to the wrist and force was applied dorsally reducing the fracture fragment.  Visual inspection revealed resolution of deformity and normal appearance of the wrist and a  normal alignment.  Pulses capillary fill and sensation are intact distally.  A sugar tong splint was applied and patient was taken to x-ray where fracture was inspected revealing significant improvement and angulation and displacement on AP and lateral views.    Lab and Radiology Results No results found for this or any previous visit (from the past 72 hour(s)). Dg Wrist 2 Views Left  Result Date: 03/21/2018 CLINICAL DATA:  Distal radius fracture.  Postreduction radiographs. EXAM: LEFT WRIST - 2 VIEW 11:54 a.m. COMPARISON:  03/21/2018 10:14 a.m. FINDINGS: The patient has undergone closed reduction of the impacted comminuted fracture of the distal left radius. There is slight residual dorsal angulation and displacement but this has been improved. IMPRESSION: Improved angulation and displacement of the impacted fracture of the distal left radius. Electronically Signed   By: Lorriane Shire M.D.   On: 03/21/2018 12:00   Dg Wrist Complete Left  Result Date: 03/21/2018 CLINICAL DATA:  Pain following fall EXAM: LEFT WRIST - COMPLETE 3+ VIEW COMPARISON:  None. FINDINGS: Frontal, oblique, lateral, and ulnar deviation scaphoid images were obtained. There is a comminuted fracture of the distal radial metaphysis with dorsal angulation distally. There is impaction at the fracture site. Fracture fragment extends to the radiocarpal joint. No other fractures are evident. No dislocation. There is calcification lateral to the distal scaphoid, likely of arthropathic etiology. Bones are osteoporotic. No erosive changes. IMPRESSION: Comminuted fracture distal radial metaphysis with dorsal angulation distally. There is impaction at fracture site. No other fractures are evident. No dislocation. There is arthropathy laterally. Bones are osteoporotic. Electronically Signed   By: Gwyndolyn Saxon  Jasmine December III M.D.   On: 03/21/2018 10:36    My personal interpretation of x-ray following reduction reveals improved but still present  volar angulation of 1 of the comminuted fragments.    Assessment and Plan: 77 y.o. female with  Left distal radius fracture.  Status post reduction today.  Continues to have some mild angulation following reduction.  The fragment is unlikely to maintain 100% perfect alignment with continued closed reduction.  I believe that we have achieved good anatomical alignment.  Plan to continue sugar tong splint and recheck in 1 week.  If significant change in alignment may be reasonable to proceed with referral to hand surgery for consideration of ORIF.  However I think that she is going to have a good outcome with serial casting and conservative management.  Recheck 1 week.  Fracture code used  Orders Placed This Encounter  Procedures  . DG Wrist 2 Views Left    Standing Status:   Future    Number of Occurrences:   1    Standing Expiration Date:   05/23/2019    Order Specific Question:   Reason for Exam (SYMPTOM  OR DIAGNOSIS REQUIRED)    Answer:   post reduction film    Order Specific Question:   Preferred imaging location?    Answer:   [facility]    Order Specific Question:   Radiology Contrast Protocol - do NOT remove file path    Answer:   \\[email or web address]   No orders of the defined types were placed in this encounter.   Historical information moved to improve visibility of documentation.  Past Medical History:  Diagnosis Date  . Arthritis   . Hypertension   . Migraine   . Osteoporosis    Past Surgical History:  Procedure Laterality Date  . ABDOMINAL HYSTERECTOMY    . BACK SURGERY    . BREAST SURGERY    . BUNIONECTOMY    . INCISIONAL HERNIA REPAIR    . KNEE ARTHROSCOPY     Social History   Tobacco Use  . Smoking status: Never Smoker  . Smokeless tobacco: Never Used  Substance Use Topics  . Alcohol use: No   family history includes Alzheimer's disease in her mother; Cancer in her brother and sister; Diabetes in her  brother and sister; Heart disease in her brother and father.  Medications: Current Outpatient Medications  Medication Sig Dispense Refill  . amLODipine (NORVASC) 5 MG tablet Take 1 tablet (5 mg total) by mouth daily. Pt needs f/u appt w/PCP for further refills. 30 tablet 0  . aspirin EC 81 MG tablet Take 1 tablet (81 mg total) by mouth daily.    Marland Kitchen atorvastatin (LIPITOR) 20 MG tablet Take 1 tablet (20 mg total) by mouth daily. 90 tablet 1  . CALCIUM-MAGNESIUM-ZINC PO Take by mouth.    . celecoxib (CELEBREX) 200 MG capsule One to 2 tablets by mouth daily as needed for pain. 60 capsule 2  . cholecalciferol (VITAMIN D) 1000 UNITS tablet Take 2,000 Units by mouth daily.    . Cyanocobalamin (VITAMIN B 12 PO) Take by mouth.    . denosumab (PROLIA) 60 MG/ML SOLN injection Inject 60 mg into the skin every 6 (six) months. Administer in upper arm, thigh, or abdomen    . ipratropium (ATROVENT) 0.03 % nasal spray Place 2 sprays into both nostrils 4 (four) times daily as needed for rhinitis. 30 mL 0  . metoprolol succinate (TOPROL-XL) 50 MG 24 hr tablet Take 1 tablet (50  mg total) by mouth daily. Take with or immediately following a meal. 90 tablet 1  . SUMAtriptan 6 MG/0.5ML SOAJ Inject 1 Syringe into the skin once. Inject 6 mg into skin at onset of headache one time 1 Syringe 3  . vitamin C (ASCORBIC ACID) 500 MG tablet Take 500 mg by mouth daily.     No current facility-administered medications for this visit.    Allergies  Allergen Reactions  . Ace Inhibitors Cough  . Atorvastatin     Possible allergy  . Cortisone     Flushing and worsening joint pain  . Losartan Other (See Comments)    Sweats, cramps  . Meloxicam     Too sleepy  . Vicodin [Hydrocodone-Acetaminophen] Other (See Comments)    Pt states that she stops breathing when she takes  Pain meds      Discussed warning signs or symptoms. Please see discharge instructions. Patient expresses understanding.

## 2018-03-26 ENCOUNTER — Encounter: Payer: Self-pay | Admitting: Family Medicine

## 2018-03-26 ENCOUNTER — Telehealth: Payer: Self-pay | Admitting: Family Medicine

## 2018-03-26 ENCOUNTER — Ambulatory Visit (INDEPENDENT_AMBULATORY_CARE_PROVIDER_SITE_OTHER): Payer: Medicare Other | Admitting: Family Medicine

## 2018-03-26 VITALS — BP 141/67 | HR 70 | Wt 103.0 lb

## 2018-03-26 DIAGNOSIS — S52532A Colles' fracture of left radius, initial encounter for closed fracture: Secondary | ICD-10-CM

## 2018-03-26 NOTE — Progress Notes (Signed)
Mercedes Decker is a 77 y.o. female who presents to [name] today for follow up distal left radius fracture.   She was seen on 7/24 for reduction of a distal left radius fracture. She is planning to go to the beach today and is worried because she is having some swelling and coldness in her fingers. She is generally feeling well and has minimal pain on Celebrex. She wants to make sure that she will be okay at the beach for a couple days before she has here follow up visit on Wednesday.  She has not tried any treatment yet.  No fevers or chills.    ROS:  As above  Exam:  BP (!) 141/67   Pulse 70   Wt 103 lb (46.7 kg)   BMI 21.53 kg/m  General: Well Developed, well nourished, and in no acute distress.  Neuro/Psych: Alert and oriented x3, extra-ocular muscles intact, able to move all 4 extremities, sensation grossly intact. Skin: Warm and dry, no rashes noted.  Respiratory: Not using accessory muscles, speaking in full sentences, trachea midline.  Cardiovascular: Pulses palpable, no extremity edema. Abdomen: Does not appear distended. MSK: Left arm and sugar tong splint.  Fingers slightly swollen no discoloration.  Capillary refill and sensation are intact distally.  Finger motion intact.  Splint rewrapped.      Assessment and Plan: 77 y.o. female with hand swelling following fracture.  Doing well.  Warning signs or symptoms reviewed.  Recheck Wednesday as scheduled.  Return sooner if needed.  Global service charge used  Historical information moved to improve visibility of documentation.  Past Medical History:  Diagnosis Date  . Arthritis   . Hypertension   . Migraine   . Osteoporosis    Past Surgical History:  Procedure Laterality Date  . ABDOMINAL HYSTERECTOMY    . BACK SURGERY    . BREAST SURGERY    . BUNIONECTOMY    . INCISIONAL HERNIA REPAIR    . KNEE ARTHROSCOPY     Social History   Tobacco Use  . Smoking  status: Never Smoker  . Smokeless tobacco: Never Used  Substance Use Topics  . Alcohol use: No   family history includes Alzheimer's disease in her mother; Cancer in her brother and sister; Diabetes in her brother and sister; Heart disease in her brother and father.  Medications: Current Outpatient Medications  Medication Sig Dispense Refill  . amLODipine (NORVASC) 5 MG tablet Take 1 tablet (5 mg total) by mouth daily. Pt needs f/u appt w/PCP for further refills. 30 tablet 0  . aspirin EC 81 MG tablet Take 1 tablet (81 mg total) by mouth daily.    Marland Kitchen atorvastatin (LIPITOR) 20 MG tablet Take 1 tablet (20 mg total) by mouth daily. 90 tablet 1  . CALCIUM-MAGNESIUM-ZINC PO Take by mouth.    . celecoxib (CELEBREX) 200 MG capsule One to 2 tablets by mouth daily as needed for pain. 60 capsule 2  . cholecalciferol (VITAMIN D) 1000 UNITS tablet Take 2,000 Units by mouth daily.    . Cyanocobalamin (VITAMIN B 12 PO) Take by mouth.    . denosumab (PROLIA) 60 MG/ML SOLN injection Inject 60 mg into the skin every 6 (six) months. Administer in upper arm, thigh, or abdomen    . ipratropium (ATROVENT) 0.03 % nasal spray Place 2 sprays into both nostrils 4 (four) times daily as needed for rhinitis. 30 mL 0  . metoprolol succinate (TOPROL-XL) 50 MG 24 hr  tablet Take 1 tablet (50 mg total) by mouth daily. Take with or immediately following a meal. 90 tablet 1  . SUMAtriptan 6 MG/0.5ML SOAJ Inject 1 Syringe into the skin once. Inject 6 mg into skin at onset of headache one time 1 Syringe 3  . vitamin C (ASCORBIC ACID) 500 MG tablet Take 500 mg by mouth daily.     No current facility-administered medications for this visit.    Allergies  Allergen Reactions  . Ace Inhibitors Cough  . Atorvastatin     Possible allergy  . Cortisone     Flushing and worsening joint pain  . Losartan Other (See Comments)    Sweats, cramps  . Meloxicam     Too sleepy  . Vicodin [Hydrocodone-Acetaminophen] Other (See Comments)      Pt states that she stops breathing when she takes  Pain meds      Discussed warning signs or symptoms. Please see discharge instructions. Patient expresses understanding.  I personally was present and performed or re-performed the history, physical exam and medical decision-making activities of this service and have verified that the service and findings are accurately documented in the student's note. ___________________________________________ Lynne Leader M.D., ABFM., CAQSM. Primary Care and Sports Medicine Adjunct Instructor of [employer]

## 2018-03-26 NOTE — Telephone Encounter (Signed)
Error

## 2018-03-28 ENCOUNTER — Ambulatory Visit: Payer: Medicare Other | Admitting: Family Medicine

## 2018-03-28 ENCOUNTER — Encounter: Payer: Self-pay | Admitting: Family Medicine

## 2018-03-28 ENCOUNTER — Ambulatory Visit (INDEPENDENT_AMBULATORY_CARE_PROVIDER_SITE_OTHER): Payer: Medicare Other | Admitting: Family Medicine

## 2018-03-28 ENCOUNTER — Ambulatory Visit (INDEPENDENT_AMBULATORY_CARE_PROVIDER_SITE_OTHER): Payer: Medicare Other

## 2018-03-28 VITALS — BP 135/63 | HR 79 | Wt 105.0 lb

## 2018-03-28 DIAGNOSIS — S52532A Colles' fracture of left radius, initial encounter for closed fracture: Secondary | ICD-10-CM | POA: Diagnosis not present

## 2018-03-28 DIAGNOSIS — S52532D Colles' fracture of left radius, subsequent encounter for closed fracture with routine healing: Secondary | ICD-10-CM | POA: Diagnosis not present

## 2018-03-28 DIAGNOSIS — I10 Essential (primary) hypertension: Secondary | ICD-10-CM

## 2018-03-28 DIAGNOSIS — W19XXXD Unspecified fall, subsequent encounter: Secondary | ICD-10-CM

## 2018-03-28 DIAGNOSIS — S52302A Unspecified fracture of shaft of left radius, initial encounter for closed fracture: Secondary | ICD-10-CM | POA: Diagnosis not present

## 2018-03-28 MED ORDER — AMLODIPINE BESYLATE 5 MG PO TABS: 5.0000 mg | ORAL_TABLET | Freq: Every day | ORAL | 0 refills | Status: DC

## 2018-03-28 NOTE — Progress Notes (Signed)
Mercedes Decker is a 77 y.o. female who presents to [facility] today for follow-up distal radius fracture left wrist.  Patient was seen in urgent care on July 24 with a displaced left distal radius fracture.  This is reduced in clinic placed into a sugar tong splint and she is here today for recheck.  She was seen on Monday the 29th for finger swelling.  The Ace wrap over the sugar tong splint was adjusted and she is feeling a lot better.  She denies any pain in her hand or elbow or wrist.    ROS:  As above  Exam:  BP 135/63   Pulse 79   Wt 105 lb (47.6 kg)   BMI 21.95 kg/m  General: Well Developed, well nourished, and in no acute distress.  Neuro/Psych: Alert and oriented x3, extra-ocular muscles intact, able to move all 4 extremities, sensation grossly intact. Skin: Warm and dry, no rashes noted.  Respiratory: Not using accessory muscles, speaking in full sentences, trachea midline.  Cardiovascular: Pulses palpable, no extremity edema. Abdomen: Does not appear distended. MSK:  Hand: Slight finger swelling.  Intact capillary refill and motion.  Wrist was not examined today.  Sugars tong splint was not removed.    Lab and Radiology Results X-ray left wrist obtained in sugar tong splint images personally independently reviewed No significant change in alignment or fracture.   Awaiting formal radiology review     Assessment and Plan: 77 y.o. female with left distal radius fracture.  No appreciable alignment change per my read.  Awaiting for radiology review.  The sugar tong splint was circularized to a long-arm cast without removal.  Patient will recheck in 2 weeks for cast change and repeat x-ray at that time.  Return sooner if needed.   Global service charged used.  Orders Placed This Encounter  Procedures  . DG Wrist 2 Views Left    Standing Status:   Future    Number of Occurrences:   1    Standing Expiration Date:    05/29/2019    Order Specific Question:   Reason for Exam (SYMPTOM  OR DIAGNOSIS REQUIRED)    Answer:   eval fracture stability in splint    Order Specific Question:   Preferred imaging location?    Answer:   [facility]    Order Specific Question:   Radiology Contrast Protocol - do NOT remove file path    Answer:   \\[email or web address]   Meds ordered this encounter  Medications  . amLODipine (NORVASC) 5 MG tablet    Sig: Take 1 tablet (5 mg total) by mouth daily. Pt needs f/u appt w/PCP for further refills.    Dispense:  30 tablet    Refill:  0    Historical information moved to improve visibility of documentation.  Past Medical History:  Diagnosis Date  . Arthritis   . Hypertension   . Migraine   . Osteoporosis    Past Surgical History:  Procedure Laterality Date  . ABDOMINAL HYSTERECTOMY    . BACK SURGERY    . BREAST SURGERY    . BUNIONECTOMY    . INCISIONAL HERNIA REPAIR    . KNEE ARTHROSCOPY     Social History   Tobacco Use  . Smoking status: Never Smoker  . Smokeless tobacco: Never Used  Substance Use Topics  . Alcohol use: No   family history includes Alzheimer's disease in her mother; Cancer in her brother and sister;  Diabetes in her brother and sister; Heart disease in her brother and father.  Medications: Current Outpatient Medications  Medication Sig Dispense Refill  . amLODipine (NORVASC) 5 MG tablet Take 1 tablet (5 mg total) by mouth daily. Pt needs f/u appt w/PCP for further refills. 30 tablet 0  . aspirin EC 81 MG tablet Take 1 tablet (81 mg total) by mouth daily.    Marland Kitchen atorvastatin (LIPITOR) 20 MG tablet Take 1 tablet (20 mg total) by mouth daily. 90 tablet 1  . CALCIUM-MAGNESIUM-ZINC PO Take by mouth.    . celecoxib (CELEBREX) 200 MG capsule One to 2 tablets by mouth daily as needed for pain. 60 capsule 2  . cholecalciferol (VITAMIN D) 1000 UNITS tablet Take 2,000 Units by mouth daily.    .  Cyanocobalamin (VITAMIN B 12 PO) Take by mouth.    . denosumab (PROLIA) 60 MG/ML SOLN injection Inject 60 mg into the skin every 6 (six) months. Administer in upper arm, thigh, or abdomen    . ipratropium (ATROVENT) 0.03 % nasal spray Place 2 sprays into both nostrils 4 (four) times daily as needed for rhinitis. 30 mL 0  . metoprolol succinate (TOPROL-XL) 50 MG 24 hr tablet Take 1 tablet (50 mg total) by mouth daily. Take with or immediately following a meal. 90 tablet 1  . SUMAtriptan 6 MG/0.5ML SOAJ Inject 1 Syringe into the skin once. Inject 6 mg into skin at onset of headache one time 1 Syringe 3  . vitamin C (ASCORBIC ACID) 500 MG tablet Take 500 mg by mouth daily.     No current facility-administered medications for this visit.    Allergies  Allergen Reactions  . Ace Inhibitors Cough  . Atorvastatin     Possible allergy  . Cortisone     Flushing and worsening joint pain  . Losartan Other (See Comments)    Sweats, cramps  . Meloxicam     Too sleepy  . Vicodin [Hydrocodone-Acetaminophen] Other (See Comments)    Pt states that she stops breathing when she takes  Pain meds      Discussed warning signs or symptoms. Please see discharge instructions. Patient expresses understanding.

## 2018-03-28 NOTE — Patient Instructions (Signed)
Thank you for coming in today. Recheck in 2 weeks.  Recheck sooner if needed.    Cast or Splint Care, Adult Casts and splints are supports that are worn to protect broken bones and other injuries. A cast or splint may hold a bone still and in the correct position while it heals. Casts and splints may also help ease pain, swelling, and muscle spasms. A cast is a hardened support that is usually made of fiberglass or plaster. It is custom-fit to the body and it offers more protection than a splint. It cannot be taken off and put back on. A splint is a type of soft support that is usually made from cloth and elastic. It can be adjusted or taken off as needed. You may need a cast or a splint if you:  Have a broken bone.  Have a soft-tissue injury.  Need to keep an injured body part from moving (keep it immobile) after surgery.  How is this treated? If you have a cast:  Do not stick anything inside the cast to scratch your skin. Sticking something in the cast increases your risk of infection.  Check the skin around the cast every day. Tell your health care provider about any concerns.  You may put lotion on dry skin around the edges of the cast. Do not put lotion on the skin underneath the cast.  Keep the cast clean.  If the cast is not waterproof: ? Do not let it get wet. ? Cover it with a watertight covering when you take a bath or a shower. If you have a splint:  Wear it as told by your health care provider. Remove it only as told by your health care provider.  Loosen the splint if your fingers or toes tingle, become numb, or turn cold and blue.  Keep the splint clean.  If the splint is not waterproof: ? Do not let it get wet. ? Cover it with a watertight covering when you take a bath or a shower. Bathing  Do not take baths or swim until your health care provider approves. Ask your health care provider if you can take showers. You may only be allowed to take sponge baths for  bathing.  If your cast or splint is not waterproof, cover it with a watertight covering when you take a bath or shower. Managing pain, stiffness, and swelling  Move your fingers or toes often to avoid stiffness and to lessen swelling.  Raise (elevate) the injured area above the level of your heart while sitting or lying down. Safety  Do not use the injured limb to support your body weight until your health care provider says that it is okay.  Use crutches or other assistive devices as told by your health care provider. General instructions  Do not put pressure on any part of the cast or splint until it is fully hardened. This may take several hours.  Return to your normal activities as told by your health care provider. Ask your health care provider what activities are safe for you.  Take over-the-counter and prescription medicines only as told by your health care provider.  Keep all follow-up visits as told by your health care provider. This is important. Contact a health care provider if:  Your cast or splint gets damaged.  The skin around the cast gets red or raw.  The skin under the cast is extremely itchy or painful.  Your cast or splint feels very uncomfortable.  Your  cast or splint is too tight or too loose.  Your cast becomes wet or it develops a soft spot or area.  You get an object stuck under your cast. Get help right away if:  Your pain is getting worse.  The injured area tingles, becomes numb, or turns cold and blue.  The part of your body above or below the cast is swollen and discolored.  You cannot feel or move your fingers or toes.  There is fluid leaking through the cast.  You have severe pain or pressure under the cast.  You have trouble breathing.  You have shortness of breath.  You have chest pain. This information is not intended to replace advice given to you by your health care provider. Make sure you discuss any questions you have with  your health care provider. Document Released: 08/12/2000 Document Revised: 03/05/2016 Document Reviewed: 02/06/2016 Elsevier Interactive Patient Education  [employer].

## 2018-04-10 ENCOUNTER — Ambulatory Visit (INDEPENDENT_AMBULATORY_CARE_PROVIDER_SITE_OTHER): Payer: Medicare Other

## 2018-04-10 ENCOUNTER — Ambulatory Visit (INDEPENDENT_AMBULATORY_CARE_PROVIDER_SITE_OTHER): Payer: Medicare Other | Admitting: Family Medicine

## 2018-04-10 ENCOUNTER — Encounter: Payer: Self-pay | Admitting: Family Medicine

## 2018-04-10 VITALS — BP 134/60 | HR 72 | Wt 104.0 lb

## 2018-04-10 DIAGNOSIS — S52532A Colles' fracture of left radius, initial encounter for closed fracture: Secondary | ICD-10-CM | POA: Diagnosis not present

## 2018-04-10 DIAGNOSIS — W19XXXD Unspecified fall, subsequent encounter: Secondary | ICD-10-CM | POA: Diagnosis not present

## 2018-04-10 DIAGNOSIS — S52532D Colles' fracture of left radius, subsequent encounter for closed fracture with routine healing: Secondary | ICD-10-CM | POA: Diagnosis not present

## 2018-04-10 DIAGNOSIS — S52592D Other fractures of lower end of left radius, subsequent encounter for closed fracture with routine healing: Secondary | ICD-10-CM | POA: Diagnosis not present

## 2018-04-10 NOTE — Progress Notes (Signed)
   Mercedes Decker is a 77 y.o. female who presents to [facility] today for follow-up distal radius fracture.  Mercedes Decker was seen on July 24 for a displaced left distal radius fracture.  Fracture was reduced in clinic treated initially with sugar tong splint and subsequently circularized a long-arm cast on July 31.  She is here today for follow-up and denies any significant wrist pain.  She does note some finger swelling which is not painful.  She feels well.    ROS:  As above  Exam:  BP 134/60   Pulse 72   Wt 104 lb (47.2 kg)   BMI 21.74 kg/m  General: Well Developed, well nourished, and in no acute distress.  Neuro/Psych: Alert and oriented x3, extra-ocular muscles intact, able to move all 4 extremities, sensation grossly intact. Skin: Warm and dry, no rashes noted.  Respiratory: Not using accessory muscles, speaking in full sentences, trachea midline.  Cardiovascular: Pulses palpable, no extremity edema. Abdomen: Does not appear distended. MSK:  Left wrist out of cast non-deformed appearing motion not tested.  Pulses cap refill and sensation are intact distally.  Elbow motion also not tested.    Lab and Radiology Results X-ray left wrist just personally independently reviewed.  Fracture present with some healing no further displacement.   Awaiting formal radiology review.  Assessment and Plan: 77 y.o. female with  Left radius fracture: Fracture looks stable today however unfortunately an excellent lateral view was not obtained due to arm stiffness and positioning.  I made a decision not to force her arm into a more lateral position for repeat x-ray for fear of displacing a stable appearing fracture. Patient was placed back into a long-arm cast and will recheck in 10 days.  At that time if fracture and exam are reassuring we will proceed with a short arm EXOS cast  Global Service charge used.   Orders Placed This Encounter    Procedures  . DG Wrist Complete Left    Standing Status:   Future    Number of Occurrences:   1    Standing Expiration Date:   06/11/2019    Order Specific Question:   Reason for Exam (SYMPTOM  OR DIAGNOSIS REQUIRED)    Answer:   follow up fx    Order Specific Question:   Preferred imaging location?    Answer:   [facility]    Order Specific Question:   Radiology Contrast Protocol - do NOT remove file path    Answer:   \\[email or web address]   No orders of the defined types were placed in this encounter.

## 2018-04-10 NOTE — Patient Instructions (Signed)
Thank you for coming in today. Recheck in about 10 days.  Return sooner if needed.  Let me know if you have problems.

## 2018-04-12 ENCOUNTER — Ambulatory Visit: Payer: Medicare Other | Admitting: Family Medicine

## 2018-04-19 ENCOUNTER — Encounter: Payer: Self-pay | Admitting: Family Medicine

## 2018-04-19 ENCOUNTER — Ambulatory Visit (INDEPENDENT_AMBULATORY_CARE_PROVIDER_SITE_OTHER): Payer: Medicare Other

## 2018-04-19 ENCOUNTER — Ambulatory Visit (INDEPENDENT_AMBULATORY_CARE_PROVIDER_SITE_OTHER): Payer: Medicare Other | Admitting: Family Medicine

## 2018-04-19 VITALS — BP 156/53 | HR 63 | Wt 105.0 lb

## 2018-04-19 DIAGNOSIS — S52532A Colles' fracture of left radius, initial encounter for closed fracture: Secondary | ICD-10-CM

## 2018-04-19 DIAGNOSIS — S52592D Other fractures of lower end of left radius, subsequent encounter for closed fracture with routine healing: Secondary | ICD-10-CM | POA: Diagnosis not present

## 2018-04-19 DIAGNOSIS — S52532D Colles' fracture of left radius, subsequent encounter for closed fracture with routine healing: Secondary | ICD-10-CM

## 2018-04-19 DIAGNOSIS — W19XXXD Unspecified fall, subsequent encounter: Secondary | ICD-10-CM | POA: Diagnosis not present

## 2018-04-19 NOTE — Patient Instructions (Signed)
Thank you for coming in today. Recheck in about 2 weeks.  Return sooner if needed.   I will get you the radiology report ASAP.

## 2018-04-19 NOTE — Progress Notes (Signed)
   Mercedes Decker is a 77 y.o. female who presents to [place] today for follow-up distal radius fracture.  Mercedes Decker was seen on July 24 for a displaced left distal radius fracture.  Fracture was reduced in clinic treated initially with sugar tong splint and subsequently circularized a long-arm cast on July 31.  Cast was changed on August 13.  At that time x-rays were obtained however a good lateral view was not obtained.  She was placed into a long-arm cast and she is here today for follow-up and denies any significant wrist pain.  She does note some finger swelling which is not painful.  She feels well.    ROS:  As above  Exam:  BP (!) 156/53   Pulse 63   Wt 105 lb (47.6 kg)   BMI 21.95 kg/m  General: Well Developed, well nourished, and in no acute distress.  Neuro/Psych: Alert and oriented x3, extra-ocular muscles intact, able to move all 4 extremities, sensation grossly intact. Skin: Warm and dry, no rashes noted.  Respiratory: Not using accessory muscles, speaking in full sentences, trachea midline.  Cardiovascular: Pulses palpable, no extremity edema. Abdomen: Does not appear distended. MSK:  Left wrist out of cast non-deformed appearing motion not tested.  Pulses cap refill and sensation are intact distally.  Elbow motion also not tested.     Lab and Radiology Results Dg Wrist Complete Left  Result Date: 04/19/2018 CLINICAL DATA:  Four-week follow-up of LEFT wrist fracture. EXAM: LEFT WRIST - COMPLETE 3+ VIEW COMPARISON:  Plain films of the LEFT wrist dated 04/10/2018 and 03/21/2018. FINDINGS: Healing fracture of the distal LEFT radius, stable in alignment. Fracture line is still faintly visible. IMPRESSION: Healing fracture of the distal LEFT radius, stable alignment. Fracture line is still faintly visible. No appreciable change compared to most recent plain film dated 04/10/2018. Electronically Signed   By: Franki Cabot M.D.   On:  04/19/2018 13:34  I personally (independently) visualized and performed the interpretation of the images attached in this note. . Well fitted long-arm cast applied today.  Assessment and Plan: 77 y.o. female with  Left radius fracture: Clinically doing well however patient does not have fantastic healing on x-ray.  Plan to continue long-arm cast.  Recheck in 2 weeks.  Return sooner if needed.  Global Service charge used.   Orders Placed This Encounter  Procedures  . DG Wrist Complete Left    Standing Status:   Future    Number of Occurrences:   1    Standing Expiration Date:   06/20/2019    Order Specific Question:   Reason for Exam (SYMPTOM  OR DIAGNOSIS REQUIRED)    Answer:   f/u fx    Order Specific Question:   Preferred imaging location?    Answer:   [facility]    Order Specific Question:   Radiology Contrast Protocol - do NOT remove file path    Answer:   \\[email or web address]s.pdf   No orders of the defined types were placed in this encounter.

## 2018-04-23 ENCOUNTER — Encounter: Payer: Self-pay | Admitting: Osteopathic Medicine

## 2018-04-23 ENCOUNTER — Ambulatory Visit (INDEPENDENT_AMBULATORY_CARE_PROVIDER_SITE_OTHER): Payer: Medicare Other | Admitting: Osteopathic Medicine

## 2018-04-23 VITALS — BP 128/74 | HR 60 | Temp 98.0°F | Wt 103.5 lb

## 2018-04-23 DIAGNOSIS — I10 Essential (primary) hypertension: Secondary | ICD-10-CM

## 2018-04-23 DIAGNOSIS — F411 Generalized anxiety disorder: Secondary | ICD-10-CM

## 2018-04-23 MED ORDER — METOPROLOL SUCCINATE ER 50 MG PO TB24: 50.0000 mg | ORAL_TABLET | Freq: Every day | ORAL | 3 refills | Status: DC

## 2018-04-23 MED ORDER — AMLODIPINE BESYLATE 5 MG PO TABS: 5.0000 mg | ORAL_TABLET | Freq: Every day | ORAL | 3 refills | Status: DC

## 2018-04-23 MED ORDER — ESCITALOPRAM OXALATE 10 MG PO TABS: 10.0000 mg | ORAL_TABLET | Freq: Every day | ORAL | 0 refills | Status: DC

## 2018-04-23 NOTE — Progress Notes (Signed)
HPI: Mercedes Decker is a 77 y.o. female who  has a past medical history of Arthritis, Hypertension, Migraine, and Osteoporosis.  she presents to [facility] today, 04/23/18,  for chief complaint of:  BP recheck Anxiety   BP doing ok - was told to come in for med refills, I didn't actually need to see her, was gong to not charge for visit but she has anxiety issues to address.    Anxiety, irritability longstanding problems but recent issue with broken arm has brought everything to a head especially w/ her and her husband who is not helping out much around the house. She requests medication for her moods and to help with sleeping. She reports worry and irritability are her main concerns. No depression other than just feeling blue about her situation, no thoughts of hurting self/others.      Past medical history, surgical history, and family history reviewed.  Current medication list and allergy/intolerance information reviewed.   (See remainder of HPI, ROS, Phys Exam below)  BP 128/74 (BP Location: Right Arm, Patient Position: Sitting, Cuff Size: Normal)   Pulse 60   Temp 98 F (36.7 C) (Oral)   Wt 103 lb 8 oz (46.9 kg)   BMI 21.63 kg/m       ASSESSMENT/PLAN:   Essential hypertension - Plan: amLODipine (NORVASC) 5 MG tablet, metoprolol succinate (TOPROL-XL) 50 MG 24 hr tablet  Generalized anxiety disorder - trial lexapro, offered counseling referral as well   Meds ordered this encounter  Medications  . amLODipine (NORVASC) 5 MG tablet    Sig: Take 1 tablet (5 mg total) by mouth daily. Pt needs f/u appt w/PCP for further refills.    Dispense:  90 tablet    Refill:  3  . metoprolol succinate (TOPROL-XL) 50 MG 24 hr tablet    Sig: Take 1 tablet (50 mg total) by mouth daily. Take with or immediately following a meal.    Dispense:  90 tablet    Refill:  3    This prescription was filled on 11/20/2017. Any refills authorized will be placed  on file.  . escitalopram (LEXAPRO) 10 MG tablet    Sig: Take 1 tablet (10 mg total) by mouth daily.    Dispense:  90 tablet    Refill:  0     Follow-up plan: Return for 4 to 6 weeks (no later than 06/04/18), to recheck on Lexapro .                          ############################################ ############################################ ############################################ ############################################    Outpatient Encounter Medications as of 04/23/2018  Medication Sig  . amLODipine (NORVASC) 5 MG tablet Take 1 tablet (5 mg total) by mouth daily. Pt needs f/u appt w/PCP for further refills.  Marland Kitchen aspirin EC 81 MG tablet Take 1 tablet (81 mg total) by mouth daily.  Marland Kitchen atorvastatin (LIPITOR) 20 MG tablet Take 1 tablet (20 mg total) by mouth daily.  Marland Kitchen CALCIUM-MAGNESIUM-ZINC PO Take by mouth.  . celecoxib (CELEBREX) 200 MG capsule One to 2 tablets by mouth daily as needed for pain.  . cholecalciferol (VITAMIN D) 1000 UNITS tablet Take 2,000 Units by mouth daily.  . Cyanocobalamin (VITAMIN B 12 PO) Take by mouth.  . denosumab (PROLIA) 60 MG/ML SOLN injection Inject 60 mg into the skin every 6 (six) months. Administer in upper arm, thigh, or abdomen  . ipratropium (ATROVENT) 0.03 % nasal spray Place  2 sprays into both nostrils 4 (four) times daily as needed for rhinitis.  . metoprolol succinate (TOPROL-XL) 50 MG 24 hr tablet Take 1 tablet (50 mg total) by mouth daily. Take with or immediately following a meal.  . SUMAtriptan 6 MG/0.5ML SOAJ Inject 1 Syringe into the skin once. Inject 6 mg into skin at onset of headache one time  . vitamin C (ASCORBIC ACID) 500 MG tablet Take 500 mg by mouth daily.   No facility-administered encounter medications on file as of 04/23/2018.    Allergies  Allergen Reactions  . Tape     BANDAIDS AND ADHESIVE TAPES TEAR SKIN (VERY THIN SKIN)  REQUESTING HYPOALLERGENIC TAPE.  "OUCHLESS TAPE." IS OK  .  Cortisone     Flushing and worsening joint pain Flushing and worsening joint pain  . Meloxicam     Too sleepy Too sleepy  . Ace Inhibitors Cough  . Atorvastatin     Possible allergy  . Losartan Other (See Comments)    Sweats, cramps  . Vicodin [Hydrocodone-Acetaminophen] Other (See Comments)    Pt states that she stops breathing when she takes  Pain meds      Review of Systems:  Constitutional: No recent illness  HEENT: No  headache, no vision change  Cardiac: No  chest pain, No  pressure, No palpitations  Respiratory:  No  shortness of breath. No  Cough  Gastrointestinal: No  abdominal pain  Musculoskeletal: No new myalgia/arthralgia  Skin: No  Rash  Neurologic: No  weakness, No  Dizziness  Psychiatric: No  concerns with depression, +concerns with anxiety  Exam:  BP 128/74 (BP Location: Right Arm, Patient Position: Sitting, Cuff Size: Normal)   Pulse 60   Temp 98 F (36.7 C) (Oral)   Wt 103 lb 8 oz (46.9 kg)   BMI 21.63 kg/m   Constitutional: VS see above. General Appearance: alert, well-developed, well-nourished, NAD  Eyes: Normal lids and conjunctive, non-icteric sclera  Ears, Nose, Mouth, Throat: MMM, Normal external inspection ears/nares/mouth/lips/gums.  Neck: No masses, trachea midline.   Respiratory: Normal respiratory effort.   Musculoskeletal: Gait normal.   Neurological: Normal balance/coordination. No tremor.  Skin: warm, dry, intact.   Psychiatric: Normal judgment/insight. Normal mood and affect. Oriented x3.   Visit summary with medication list and pertinent instructions was printed for patient to review, advised to alert [place] if any changes needed. All questions at time of visit were answered - patient instructed to contact office with any additional concerns. ER/RTC precautions were reviewed with the patient and understanding verbalized.   Follow-up plan: Return for 4 to 6 weeks (no later than 06/04/18), to recheck on Lexapro  .    Please note: voice recognition software was used to produce this document, and typos may escape review. Please contact Dr. Sheppard Coil for any needed clarifications.

## 2018-04-24 ENCOUNTER — Telehealth: Payer: Self-pay | Admitting: Osteopathic Medicine

## 2018-04-24 NOTE — Telephone Encounter (Signed)
-----   Message from Gregor Hams, MD sent at 04/19/2018 11:37 AM EDT ----- Regarding: High Dose flu Add to list for high dose flu shot please

## 2018-04-24 NOTE — Telephone Encounter (Signed)
Added

## 2018-04-26 DIAGNOSIS — H524 Presbyopia: Secondary | ICD-10-CM | POA: Diagnosis not present

## 2018-04-26 DIAGNOSIS — H02834 Dermatochalasis of left upper eyelid: Secondary | ICD-10-CM | POA: Diagnosis not present

## 2018-04-26 DIAGNOSIS — Z961 Presence of intraocular lens: Secondary | ICD-10-CM | POA: Diagnosis not present

## 2018-04-26 DIAGNOSIS — H2512 Age-related nuclear cataract, left eye: Secondary | ICD-10-CM | POA: Diagnosis not present

## 2018-04-26 DIAGNOSIS — Z83511 Family history of glaucoma: Secondary | ICD-10-CM | POA: Diagnosis not present

## 2018-04-26 DIAGNOSIS — H04123 Dry eye syndrome of bilateral lacrimal glands: Secondary | ICD-10-CM | POA: Diagnosis not present

## 2018-04-26 DIAGNOSIS — H02831 Dermatochalasis of right upper eyelid: Secondary | ICD-10-CM | POA: Diagnosis not present

## 2018-04-26 DIAGNOSIS — H5203 Hypermetropia, bilateral: Secondary | ICD-10-CM | POA: Diagnosis not present

## 2018-04-26 DIAGNOSIS — H43393 Other vitreous opacities, bilateral: Secondary | ICD-10-CM | POA: Diagnosis not present

## 2018-04-26 DIAGNOSIS — H52203 Unspecified astigmatism, bilateral: Secondary | ICD-10-CM | POA: Diagnosis not present

## 2018-05-03 ENCOUNTER — Encounter: Payer: Self-pay | Admitting: Family Medicine

## 2018-05-03 ENCOUNTER — Ambulatory Visit (INDEPENDENT_AMBULATORY_CARE_PROVIDER_SITE_OTHER): Payer: Medicare Other | Admitting: Family Medicine

## 2018-05-03 ENCOUNTER — Ambulatory Visit (INDEPENDENT_AMBULATORY_CARE_PROVIDER_SITE_OTHER): Payer: Medicare Other

## 2018-05-03 VITALS — BP 163/71 | HR 64 | Temp 97.9°F | Wt 105.4 lb

## 2018-05-03 DIAGNOSIS — S52532D Colles' fracture of left radius, subsequent encounter for closed fracture with routine healing: Secondary | ICD-10-CM | POA: Diagnosis not present

## 2018-05-03 DIAGNOSIS — Z23 Encounter for immunization: Secondary | ICD-10-CM | POA: Diagnosis not present

## 2018-05-03 DIAGNOSIS — S52532A Colles' fracture of left radius, initial encounter for closed fracture: Secondary | ICD-10-CM | POA: Diagnosis not present

## 2018-05-03 DIAGNOSIS — W19XXXD Unspecified fall, subsequent encounter: Secondary | ICD-10-CM

## 2018-05-03 DIAGNOSIS — S52592D Other fractures of lower end of left radius, subsequent encounter for closed fracture with routine healing: Secondary | ICD-10-CM | POA: Diagnosis not present

## 2018-05-03 NOTE — Progress Notes (Signed)
Mercedes Decker is a 77 y.o. female who presents to [place] today for left radius fracture.  Patient was originally seen on July 24 for displaced left distal radius fracture.  She had reduction of fracture in urgent care and was treated with immobilization.  She is been immobilized in either sugar tong splint or long-arm cast until today.  She notes her pain is typically pretty well controlled.    ROS:  As above  Exam:  BP (!) 163/71   Pulse 64   Temp 97.9 F (36.6 C) (Oral)   Wt 105 lb 6.4 oz (47.8 kg)   BMI 22.03 kg/m  General: Well Developed, well nourished, and in no acute distress.  Neuro/Psych: Alert and oriented x3, extra-ocular muscles intact, able to move all 4 extremities, sensation grossly intact. Skin: Warm and dry, no rashes noted.  Respiratory: Not using accessory muscles, speaking in full sentences, trachea midline.  Cardiovascular: Pulses palpable, no extremity edema. Abdomen: Does not appear distended. MSK: Left wrist normal-appearing without deformity.  Motion not tested.  Pulses capillary fill and sensation intact distally. Left elbow: Normal flexion decreased extension and supination range of motion.    Lab and Radiology Results X-ray left wrist results personally independent reviewed. Fracture with some callus present but not dramatic bone healing.  No significant change in mild relation on recheck lateral view. Await formal radiology review    Assessment and Plan: 77 y.o. female with left distal radius fracture.  Doing reasonably well.  No significant change in angulation or mild displacement on today's x-rays.  Patient will be transition to a short arm cast.  Will use EXOS cast type.  Recheck in 2 to 3 weeks.  Return sooner if not doing well.   Flu vaccine given today prior to discharge.  Global Service charge used.  Orders Placed This Encounter  Procedures  . DG Wrist Complete Left    Standing  Status:   Future    Number of Occurrences:   1    Standing Expiration Date:   07/04/2019    Order Specific Question:   Reason for Exam (SYMPTOM  OR DIAGNOSIS REQUIRED)    Answer:   follow up fracture    Order Specific Question:   Preferred imaging location?    Answer:   [facility]    Order Specific Question:   Radiology Contrast Protocol - do NOT remove file path    Answer:   \\[email or web address]s.pdf  . Flu vaccine HIGH DOSE PF (Fluzone High dose)   No orders of the defined types were placed in this encounter.   Historical information moved to improve visibility of documentation.  Past Medical History:  Diagnosis Date  . Arthritis   . Hypertension   . Migraine   . Osteoporosis    Past Surgical History:  Procedure Laterality Date  . ABDOMINAL HYSTERECTOMY    . BACK SURGERY    . BREAST SURGERY    . BUNIONECTOMY    . INCISIONAL HERNIA REPAIR    . KNEE ARTHROSCOPY     Social History   Tobacco Use  . Smoking status: Never Smoker  . Smokeless tobacco: Never Used  Substance Use Topics  . Alcohol use: No   family history includes Alzheimer's disease in her mother; Cancer in her brother and sister; Diabetes in her brother and sister; Heart disease in her brother and father.  Medications: Current Outpatient Medications  Medication Sig Dispense Refill  . amLODipine (NORVASC) 5 MG  tablet Take 1 tablet (5 mg total) by mouth daily. Pt needs f/u appt w/PCP for further refills. 90 tablet 3  . aspirin EC 81 MG tablet Take 1 tablet (81 mg total) by mouth daily.    Marland Kitchen atorvastatin (LIPITOR) 20 MG tablet Take 1 tablet (20 mg total) by mouth daily. 90 tablet 1  . CALCIUM-MAGNESIUM-ZINC PO Take by mouth.    . celecoxib (CELEBREX) 200 MG capsule One to 2 tablets by mouth daily as needed for pain. 60 capsule 2  . cholecalciferol (VITAMIN D) 1000 UNITS tablet Take 2,000 Units by mouth daily.    . Cyanocobalamin (VITAMIN B 12 PO) Take by mouth.      . denosumab (PROLIA) 60 MG/ML SOLN injection Inject 60 mg into the skin every 6 (six) months. Administer in upper arm, thigh, or abdomen    . escitalopram (LEXAPRO) 10 MG tablet Take 1 tablet (10 mg total) by mouth daily. 90 tablet 0  . ipratropium (ATROVENT) 0.03 % nasal spray Place 2 sprays into both nostrils 4 (four) times daily as needed for rhinitis. 30 mL 0  . metoprolol succinate (TOPROL-XL) 50 MG 24 hr tablet Take 1 tablet (50 mg total) by mouth daily. Take with or immediately following a meal. 90 tablet 3  . SUMAtriptan 6 MG/0.5ML SOAJ Inject 1 Syringe into the skin once. Inject 6 mg into skin at onset of headache one time 1 Syringe 3  . vitamin C (ASCORBIC ACID) 500 MG tablet Take 500 mg by mouth daily.     No current facility-administered medications for this visit.    Allergies  Allergen Reactions  . Tape     BANDAIDS AND ADHESIVE TAPES TEAR SKIN (VERY THIN SKIN)  REQUESTING HYPOALLERGENIC TAPE.  "OUCHLESS TAPE." IS OK  . Cortisone     Flushing and worsening joint pain Flushing and worsening joint pain  . Meloxicam     Too sleepy Too sleepy  . Ace Inhibitors Cough  . Atorvastatin     Possible allergy  . Losartan Other (See Comments)    Sweats, cramps  . Vicodin [Hydrocodone-Acetaminophen] Other (See Comments)    Pt states that she stops breathing when she takes  Pain meds      Discussed warning signs or symptoms. Please see discharge instructions. Patient expresses understanding.

## 2018-05-03 NOTE — Patient Instructions (Signed)
Thank you for coming in today. Work on elbow extension.  Recheck in 3 weeks.  Let me know if your pain worsens.

## 2018-05-07 ENCOUNTER — Telehealth: Payer: Self-pay | Admitting: Osteopathic Medicine

## 2018-05-07 NOTE — Telephone Encounter (Signed)
As per pt, Lexapro medication is helping a bit. She is not having side effects from medication. Pt is no longer having anxiety. She would like to continue taking Lexapro 10 mg.

## 2018-05-07 NOTE — Telephone Encounter (Signed)
Please call patient: Just calling to check up on her and see how she is doing after starting Lexapro a couple of weeks ago.  If any side effects or problems, let me know.  If she is tolerating the medicine okay but still having anxiety, we have the option to increase to 20 mg dose if she would like, or can give the 10 mg dose a bit more time

## 2018-05-08 NOTE — Telephone Encounter (Signed)
Sounds good, thanks.

## 2018-05-10 ENCOUNTER — Telehealth: Payer: Self-pay

## 2018-05-10 ENCOUNTER — Ambulatory Visit (INDEPENDENT_AMBULATORY_CARE_PROVIDER_SITE_OTHER): Payer: Medicare Other | Admitting: Family Medicine

## 2018-05-10 VITALS — BP 125/63 | HR 74 | Ht <= 58 in | Wt 106.0 lb

## 2018-05-10 DIAGNOSIS — S52532A Colles' fracture of left radius, initial encounter for closed fracture: Secondary | ICD-10-CM

## 2018-05-10 NOTE — Progress Notes (Signed)
Mercedes Decker is a 77 y.o. female who presents to [facility] today for wrist pain.  Mercedes Decker returns to clinic today with a bit of wrist pain.  She has been using her EXOS cast now for about a week.  She notes its been much better than the long-arm cast but she is having a bit of discomfort into her ulnar dorsal wrist where the cast is compressing her skin.  She denies any radiating pain weakness or numbness fevers or chills.    ROS:  As above  Exam:  BP 125/63   Pulse 74   Ht 4\' 10"  (1.473 m)   Wt 106 lb (48.1 kg)   BMI 22.15 kg/m  General: Well Developed, well nourished, and in no acute distress.   MSK: Left wrist with cast removed no deformities.  Mild skin erythema at the ulnar styloid.  Not particularly tender.  Motion not tested.      Assessment and Plan: 77 y.o. female with left wrist pain due to cast fit.  Cast remolded and was found to be much more comfortable.  Recheck his originally scheduled in 2 weeks.  Return sooner if needed.   Global service charge used  Historical information moved to improve visibility of documentation.  Past Medical History:  Diagnosis Date  . Arthritis   . Hypertension   . Migraine   . Osteoporosis    Past Surgical History:  Procedure Laterality Date  . ABDOMINAL HYSTERECTOMY    . BACK SURGERY    . BREAST SURGERY    . BUNIONECTOMY    . INCISIONAL HERNIA REPAIR    . KNEE ARTHROSCOPY     Social History   Tobacco Use  . Smoking status: Never Smoker  . Smokeless tobacco: Never Used  Substance Use Topics  . Alcohol use: No   family history includes Alzheimer's disease in her mother; Cancer in her brother and sister; Diabetes in her brother and sister; Heart disease in her brother and father.  Medications: Current Outpatient Medications  Medication Sig Dispense Refill  . amLODipine (NORVASC) 5 MG tablet Take 1 tablet (5 mg total) by mouth daily. Pt needs f/u appt w/PCP for  further refills. 90 tablet 3  . aspirin EC 81 MG tablet Take 1 tablet (81 mg total) by mouth daily.    Mercedes Decker Kitchen atorvastatin (LIPITOR) 20 MG tablet Take 1 tablet (20 mg total) by mouth daily. 90 tablet 1  . CALCIUM-MAGNESIUM-ZINC PO Take by mouth.    . celecoxib (CELEBREX) 200 MG capsule One to 2 tablets by mouth daily as needed for pain. 60 capsule 2  . cholecalciferol (VITAMIN D) 1000 UNITS tablet Take 2,000 Units by mouth daily.    . Cyanocobalamin (VITAMIN B 12 PO) Take by mouth.    . denosumab (PROLIA) 60 MG/ML SOLN injection Inject 60 mg into the skin every 6 (six) months. Administer in upper arm, thigh, or abdomen    . escitalopram (LEXAPRO) 10 MG tablet Take 1 tablet (10 mg total) by mouth daily. 90 tablet 0  . ipratropium (ATROVENT) 0.03 % nasal spray Place 2 sprays into both nostrils 4 (four) times daily as needed for rhinitis. 30 mL 0  . metoprolol succinate (TOPROL-XL) 50 MG 24 hr tablet Take 1 tablet (50 mg total) by mouth daily. Take with or immediately following a meal. 90 tablet 3  . SUMAtriptan 6 MG/0.5ML SOAJ Inject 1 Syringe into the skin once. Inject 6 mg into skin at onset of  headache one time 1 Syringe 3  . vitamin C (ASCORBIC ACID) 500 MG tablet Take 500 mg by mouth daily.     No current facility-administered medications for this visit.    Allergies  Allergen Reactions  . Tape     BANDAIDS AND ADHESIVE TAPES TEAR SKIN (VERY THIN SKIN)  REQUESTING HYPOALLERGENIC TAPE.  "OUCHLESS TAPE." IS OK  . Cortisone     Flushing and worsening joint pain Flushing and worsening joint pain  . Meloxicam     Too sleepy Too sleepy  . Ace Inhibitors Cough  . Atorvastatin     Possible allergy  . Losartan Other (See Comments)    Sweats, cramps  . Vicodin [Hydrocodone-Acetaminophen] Other (See Comments)    Pt states that she stops breathing when she takes  Pain meds      Discussed warning signs or symptoms. Please see discharge instructions. Patient expresses understanding.

## 2018-05-10 NOTE — Patient Instructions (Signed)
Thank you for coming in today. Recheck as scheduled.   

## 2018-05-10 NOTE — Telephone Encounter (Signed)
Make appointment for today or tomorrow.

## 2018-05-10 NOTE — Telephone Encounter (Signed)
Pt calls- complains of her wrist aching along with sharp pains. Pt has new brace and states it is pretty tight around her wrist but is too afraid to loosen it because that's where the breaks are at. Pt is not sure if this aching/sharp pain is coming from the new brace or not.   Wanting to know if she should "just tough it out" until her next appt on 05-21-18 or if she should make an appt to be seen sooner  Please advise

## 2018-05-10 NOTE — Telephone Encounter (Signed)
Patient scheduled.

## 2018-05-21 ENCOUNTER — Ambulatory Visit: Payer: Medicare Other | Admitting: Osteopathic Medicine

## 2018-05-22 ENCOUNTER — Ambulatory Visit (INDEPENDENT_AMBULATORY_CARE_PROVIDER_SITE_OTHER): Payer: Medicare Other | Admitting: Sports Medicine

## 2018-05-22 DIAGNOSIS — M7061 Trochanteric bursitis, right hip: Secondary | ICD-10-CM

## 2018-05-22 NOTE — Assessment & Plan Note (Addendum)
Previous injection was about 7 months ago. She is 6 weeks out from her distal radius fracture, we did have a long discussion about the possibility of delayed fracture healing with a steroid injection. She is allergic to narcotics, she is using Tylenol high dose in the evenings but having intolerable pain from her right hip. She understands that the steroid injection may add a few weeks to her healing but is agreeable to proceed. Repeat right greater trochanteric bursa injection today, return as needed.

## 2018-05-22 NOTE — Progress Notes (Signed)
Subjective:    CC: Right hip pain  HPI: For the past few weeks this pleasant 77 year old female has had worsening pain in her right hip, lateral, moderate, persistent without radiation, difficult to lay on the ipsilateral side.  We did a trochanteric bursa injection about 7 months ago with good response.  She does need to get a bit more diligent with her rehab exercises.  She did have a fall 6 weeks ago, she is post closed reduction of a distal radius fracture.  Still tender over the fracture site.  I reviewed the past medical history, family history, social history, surgical history, and allergies today and no changes were needed.  Please see the problem list section below in epic for further details.  Past Medical History: Past Medical History:  Diagnosis Date  . Arthritis   . Hypertension   . Migraine   . Osteoporosis    Past Surgical History: Past Surgical History:  Procedure Laterality Date  . ABDOMINAL HYSTERECTOMY    . BACK SURGERY    . BREAST SURGERY    . BUNIONECTOMY    . INCISIONAL HERNIA REPAIR    . KNEE ARTHROSCOPY     Social History: Social History   Socioeconomic History  . Marital status: Married    Spouse name: Not on file  . Number of children: Not on file  . Years of education: Not on file  . Highest education level: Not on file  Occupational History  . Not on file  Social Needs  . Financial resource strain: Not on file  . Food insecurity:    Worry: Not on file    Inability: Not on file  . Transportation needs:    Medical: Not on file    Non-medical: Not on file  Tobacco Use  . Smoking status: Never Smoker  . Smokeless tobacco: Never Used  Substance and Sexual Activity  . Alcohol use: No  . Drug use: No  . Sexual activity: Yes    Birth control/protection: Post-menopausal  Lifestyle  . Physical activity:    Days per week: Not on file    Minutes per session: Not on file  . Stress: Not on file  Relationships  . Social connections:    Talks  on phone: Not on file    Gets together: Not on file    Attends religious service: Not on file    Active member of club or organization: Not on file    Attends meetings of clubs or organizations: Not on file    Relationship status: Not on file  Other Topics Concern  . Not on file  Social History Narrative  . Not on file   Family History: Family History  Problem Relation Age of Onset  . Alzheimer's disease Mother   . Heart disease Father   . Cancer Sister   . Diabetes Sister   . Heart disease Brother   . Diabetes Brother   . Cancer Brother    Allergies: Allergies  Allergen Reactions  . Tape     BANDAIDS AND ADHESIVE TAPES TEAR SKIN (VERY THIN SKIN)  REQUESTING HYPOALLERGENIC TAPE.  "OUCHLESS TAPE." IS OK  . Cortisone     Flushing and worsening joint pain Flushing and worsening joint pain  . Meloxicam     Too sleepy Too sleepy  . Ace Inhibitors Cough  . Atorvastatin     Possible allergy  . Losartan Other (See Comments)    Sweats, cramps  . Vicodin [Hydrocodone-Acetaminophen] Other (See Comments)  Pt states that she stops breathing when she takes  Pain meds   Medications: See med rec.  Review of Systems: No fevers, chills, night sweats, weight loss, chest pain, or shortness of breath.   Objective:    General: Well Developed, well nourished, and in no acute distress.  Neuro: Alert and oriented x3, extra-ocular muscles intact, sensation grossly intact.  HEENT: Normocephalic, atraumatic, pupils equal round reactive to light, neck supple, no masses, no lymphadenopathy, thyroid nonpalpable.  Skin: Warm and dry, no rashes. Cardiac: Regular rate and rhythm, no murmurs rubs or gallops, no lower extremity edema.  Respiratory: Clear to auscultation bilaterally. Not using accessory muscles, speaking in full sentences. Right hip: ROM IR: 60 Deg, ER: 60 Deg, Flexion: 120 Deg, Extension: 100 Deg, Abduction: 45 Deg, Adduction: 45 Deg Strength IR: 5/5, ER: 5/5, Flexion: 5/5,  Extension: 5/5, Abduction: 5/5, Adduction: 5/5 Pelvic alignment unremarkable to inspection and palpation. Standing hip rotation and gait without trendelenburg / unsteadiness. Greater trochanter with tenderness to palpation. No tenderness over piriformis. No SI joint tenderness and normal minimal SI movement.  Procedure: Real-time Ultrasound Guided Injection of right greater trochanteric bursa Device: GE Logiq E  Verbal informed consent obtained.  Time-out conducted.  Noted no overlying erythema, induration, or other signs of local infection.  Skin prepped in a sterile fashion.  Local anesthesia: Topical Ethyl chloride.  With sterile technique and under real time ultrasound guidance: 1 cc kenalog 40, 2 cc lidocaine, 2 cc bupivacaine injected easily. Completed without difficulty  Pain immediately resolved suggesting accurate placement of the medication.  Advised to call if fevers/chills, erythema, induration, drainage, or persistent bleeding.  Images permanently stored and available for review in the ultrasound unit.  Impression: Technically successful ultrasound guided injection.  Impression and Recommendations:    Greater trochanteric bursitis of right hip Previous injection was about 7 months ago. She is 6 weeks out from her distal radius fracture, we did have a long discussion about the possibility of delayed fracture healing with a steroid injection. She is allergic to narcotics, she is using Tylenol high dose in the evenings but having intolerable pain from her right hip. She understands that the steroid injection may add a few weeks to her healing but is agreeable to proceed. Repeat right greater trochanteric bursa injection today, return as needed. ___________________________________________ Gwen Her. Dianah Field, M.D., ABFM., CAQSM. Primary Care and [place] In[employer] of [place] [facility]

## 2018-05-24 ENCOUNTER — Ambulatory Visit (INDEPENDENT_AMBULATORY_CARE_PROVIDER_SITE_OTHER): Payer: Medicare Other | Admitting: Family Medicine

## 2018-05-24 ENCOUNTER — Ambulatory Visit (INDEPENDENT_AMBULATORY_CARE_PROVIDER_SITE_OTHER): Payer: Medicare Other

## 2018-05-24 ENCOUNTER — Ambulatory Visit: Payer: Medicare Other | Admitting: Osteopathic Medicine

## 2018-05-24 VITALS — BP 144/58 | HR 61 | Ht <= 58 in | Wt 106.0 lb

## 2018-05-24 DIAGNOSIS — W19XXXD Unspecified fall, subsequent encounter: Secondary | ICD-10-CM

## 2018-05-24 DIAGNOSIS — S52532D Colles' fracture of left radius, subsequent encounter for closed fracture with routine healing: Secondary | ICD-10-CM | POA: Diagnosis not present

## 2018-05-24 DIAGNOSIS — S52532A Colles' fracture of left radius, initial encounter for closed fracture: Secondary | ICD-10-CM

## 2018-05-24 DIAGNOSIS — S6292XA Unspecified fracture of left wrist and hand, initial encounter for closed fracture: Secondary | ICD-10-CM | POA: Diagnosis not present

## 2018-05-24 NOTE — Patient Instructions (Signed)
Thank you for coming in today. Recheck in about 3 weeks.

## 2018-05-24 NOTE — Progress Notes (Signed)
Mercedes Decker is a 77 y.o. female who presents to [place] today for follow-up of left wrist fracture.  Patient was seen July 24 for left wrist fracture requiring reduction in office.  She is been immobilized since and was transition to a short arm cast on September 5.  She is done well and notes only very minimal pain at the distal radius.  She is quite satisfied with how things are going.    ROS:  As above  Exam:  BP (!) 144/58   Pulse 61   Ht 4\' 10"  (1.473 m)   Wt 106 lb (48.1 kg)   BMI 22.15 kg/m  General: Well Developed, well nourished, and in no acute distress.  Neuro/Psych: Alert and oriented x3, extra-ocular muscles intact, able to move all 4 extremities, sensation grossly intact. Skin: Warm and dry, no rashes noted.  Respiratory: Not using accessory muscles, speaking in full sentences, trachea midline.  Cardiovascular: Pulses palpable, no extremity edema. Abdomen: Does not appear distended. MSK: Left wrist no deformity.  Nontender.  Capillary refill and sensation are intact distally    Lab and Radiology Results X-ray left wrist images personally independently reviewed Healing distal radius fracture with minimal volar angulation without significant change. Await formal radiology review    Assessment and Plan: 77 y.o. female with healing left wrist radius fracture.  Doing well.  Continue EXOS cast recheck in 3 weeks.   Global service charge used Orders Placed This Encounter  Procedures  . DG Wrist 2 Views Left    Standing Status:   Future    Number of Occurrences:   1    Standing Expiration Date:   07/25/2019    Order Specific Question:   Reason for Exam (SYMPTOM  OR DIAGNOSIS REQUIRED)    Answer:   f/u fx    Order Specific Question:   Preferred imaging location?    Answer:   [facility]    Order Specific Question:   Radiology Contrast Protocol - do NOT remove file path    Answer:    \\[email or web address]   No orders of the defined types were placed in this encounter.   Historical information moved to improve visibility of documentation.  Past Medical History:  Diagnosis Date  . Arthritis   . Hypertension   . Migraine   . Osteoporosis    Past Surgical History:  Procedure Laterality Date  . ABDOMINAL HYSTERECTOMY    . BACK SURGERY    . BREAST SURGERY    . BUNIONECTOMY    . INCISIONAL HERNIA REPAIR    . KNEE ARTHROSCOPY     Social History   Tobacco Use  . Smoking status: Never Smoker  . Smokeless tobacco: Never Used  Substance Use Topics  . Alcohol use: No   family history includes Alzheimer's disease in her mother; Cancer in her brother and sister; Diabetes in her brother and sister; Heart disease in her brother and father.  Medications: Current Outpatient Medications  Medication Sig Dispense Refill  . amLODipine (NORVASC) 5 MG tablet Take 1 tablet (5 mg total) by mouth daily. Pt needs f/u appt w/PCP for further refills. 90 tablet 3  . aspirin EC 81 MG tablet Take 1 tablet (81 mg total) by mouth daily.    Marland Kitchen atorvastatin (LIPITOR) 20 MG tablet Take 1 tablet (20 mg total) by mouth daily. 90 tablet 1  . CALCIUM-MAGNESIUM-ZINC PO Take by mouth.    . celecoxib (CELEBREX) 200 MG capsule One  to 2 tablets by mouth daily as needed for pain. 60 capsule 2  . cholecalciferol (VITAMIN D) 1000 UNITS tablet Take 2,000 Units by mouth daily.    . Cyanocobalamin (VITAMIN B 12 PO) Take by mouth.    . denosumab (PROLIA) 60 MG/ML SOLN injection Inject 60 mg into the skin every 6 (six) months. Administer in upper arm, thigh, or abdomen    . escitalopram (LEXAPRO) 10 MG tablet Take 1 tablet (10 mg total) by mouth daily. 90 tablet 0  . ipratropium (ATROVENT) 0.03 % nasal spray Place 2 sprays into both nostrils 4 (four) times daily as needed for rhinitis. 30 mL 0  . metoprolol succinate (TOPROL-XL) 50 MG 24 hr tablet Take 1 tablet (50 mg  total) by mouth daily. Take with or immediately following a meal. 90 tablet 3  . SUMAtriptan 6 MG/0.5ML SOAJ Inject 1 Syringe into the skin once. Inject 6 mg into skin at onset of headache one time 1 Syringe 3  . vitamin C (ASCORBIC ACID) 500 MG tablet Take 500 mg by mouth daily.     No current facility-administered medications for this visit.    Allergies  Allergen Reactions  . Tape     BANDAIDS AND ADHESIVE TAPES TEAR SKIN (VERY THIN SKIN)  REQUESTING HYPOALLERGENIC TAPE.  "OUCHLESS TAPE." IS OK  . Cortisone     Flushing and worsening joint pain Flushing and worsening joint pain  . Meloxicam     Too sleepy Too sleepy  . Ace Inhibitors Cough  . Atorvastatin     Possible allergy  . Losartan Other (See Comments)    Sweats, cramps  . Vicodin [Hydrocodone-Acetaminophen] Other (See Comments)    Pt states that she stops breathing when she takes  Pain meds      Discussed warning signs or symptoms. Please see discharge instructions. Patient expresses understanding.

## 2018-06-14 ENCOUNTER — Ambulatory Visit (INDEPENDENT_AMBULATORY_CARE_PROVIDER_SITE_OTHER): Payer: Medicare Other | Admitting: Family Medicine

## 2018-06-14 ENCOUNTER — Encounter: Payer: Self-pay | Admitting: Family Medicine

## 2018-06-14 ENCOUNTER — Ambulatory Visit (INDEPENDENT_AMBULATORY_CARE_PROVIDER_SITE_OTHER): Payer: Medicare Other

## 2018-06-14 VITALS — BP 153/59 | HR 63 | Wt 104.0 lb

## 2018-06-14 DIAGNOSIS — S52532D Colles' fracture of left radius, subsequent encounter for closed fracture with routine healing: Secondary | ICD-10-CM | POA: Diagnosis not present

## 2018-06-14 DIAGNOSIS — W19XXXD Unspecified fall, subsequent encounter: Secondary | ICD-10-CM | POA: Diagnosis not present

## 2018-06-14 DIAGNOSIS — S52532A Colles' fracture of left radius, initial encounter for closed fracture: Secondary | ICD-10-CM

## 2018-06-14 DIAGNOSIS — S52502A Unspecified fracture of the lower end of left radius, initial encounter for closed fracture: Secondary | ICD-10-CM | POA: Diagnosis not present

## 2018-06-14 NOTE — Patient Instructions (Signed)
Thank you for coming in today. Ok start home wrist range of motion exercises.  Use brace with activity.  Recheck in 1 month.  If not doing well let me know and I can order hand therapy.

## 2018-06-14 NOTE — Progress Notes (Signed)
Mercedes Decker is a 77 y.o. female who presents to [place] today for follow-up left distal radius fracture.  Mercedes Decker has done extremely well in the last 3 weeks.  She notes that her wrist is no longer painful at all.  She is happy with how things are going.  She is done a lot of hand motion but not a lot of wrist motion exercises yet.    ROS:  As above  Exam:  BP (!) 153/59   Pulse 63   Wt 104 lb (47.2 kg)   BMI 21.74 kg/m  General: Well Developed, well nourished, and in no acute distress.  Neuro/Psych: Alert and oriented x3, extra-ocular muscles intact, able to move all 4 extremities, sensation grossly intact. Skin: Warm and dry, no rashes noted.  Respiratory: Not using accessory muscles, speaking in full sentences, trachea midline.  Cardiovascular: Pulses palpable, no extremity edema. Abdomen: Does not appear distended. MSK: Left wrist normal-appearing nontender.  Limited motion.  Pulses capillary refill and sensation are intact distally.    Lab and Radiology Results X-ray left wrist images personally and independently reviewed Well healing fracture at distal radius with no change in angulation. Await formal radiology review    Assessment and Plan: 77 y.o. female with left wrist fracture doing quite well.  Plan for use of EXOS cast/brace with activity but otherwise can remove cast and work on wrist motion.  If not doing well with home exercises for wrist motion will refer to hand therapy.  Recheck in 1 month or sooner if needed.  Global service charge used  Orders Placed This Encounter  Procedures  . DG Wrist 2 Views Left    Standing Status:   Future    Number of Occurrences:   1    Standing Expiration Date:   08/15/2019    Order Specific Question:   Reason for Exam (SYMPTOM  OR DIAGNOSIS REQUIRED)    Answer:   f/u fracture healing    Order Specific Question:   Preferred imaging location?    Answer:   [facility]    Order Specific Question:   Radiology Contrast Protocol - do NOT remove file path    Answer:   \\[email or web address]   No orders of the defined types were placed in this encounter.   Historical information moved to improve visibility of documentation.  Past Medical History:  Diagnosis Date  . Arthritis   . Hypertension   . Migraine   . Osteoporosis    Past Surgical History:  Procedure Laterality Date  . ABDOMINAL HYSTERECTOMY    . BACK SURGERY    . BREAST SURGERY    . BUNIONECTOMY    . INCISIONAL HERNIA REPAIR    . KNEE ARTHROSCOPY     Social History   Tobacco Use  . Smoking status: Never Smoker  . Smokeless tobacco: Never Used  Substance Use Topics  . Alcohol use: No   family history includes Alzheimer's disease in her mother; Cancer in her brother and sister; Diabetes in her brother and sister; Heart disease in her brother and father.  Medications: Current Outpatient Medications  Medication Sig Dispense Refill  . amLODipine (NORVASC) 5 MG tablet Take 1 tablet (5 mg total) by mouth daily. Pt needs f/u appt w/PCP for further refills. 90 tablet 3  . aspirin EC 81 MG tablet Take 1 tablet (81 mg total) by mouth daily.    Marland Kitchen atorvastatin (LIPITOR) 20 MG tablet Take 1 tablet (20  mg total) by mouth daily. 90 tablet 1  . CALCIUM-MAGNESIUM-ZINC PO Take by mouth.    . celecoxib (CELEBREX) 200 MG capsule One to 2 tablets by mouth daily as needed for pain. 60 capsule 2  . cholecalciferol (VITAMIN D) 1000 UNITS tablet Take 2,000 Units by mouth daily.    . Cyanocobalamin (VITAMIN B 12 PO) Take by mouth.    . denosumab (PROLIA) 60 MG/ML SOLN injection Inject 60 mg into the skin every 6 (six) months. Administer in upper arm, thigh, or abdomen    . escitalopram (LEXAPRO) 10 MG tablet Take 1 tablet (10 mg total) by mouth daily. 90 tablet 0  . ipratropium (ATROVENT) 0.03 % nasal spray Place 2 sprays into both nostrils 4 (four) times daily  as needed for rhinitis. 30 mL 0  . metoprolol succinate (TOPROL-XL) 50 MG 24 hr tablet Take 1 tablet (50 mg total) by mouth daily. Take with or immediately following a meal. 90 tablet 3  . SUMAtriptan 6 MG/0.5ML SOAJ Inject 1 Syringe into the skin once. Inject 6 mg into skin at onset of headache one time 1 Syringe 3  . vitamin C (ASCORBIC ACID) 500 MG tablet Take 500 mg by mouth daily.     No current facility-administered medications for this visit.    Allergies  Allergen Reactions  . Tape     BANDAIDS AND ADHESIVE TAPES TEAR SKIN (VERY THIN SKIN)  REQUESTING HYPOALLERGENIC TAPE.  "OUCHLESS TAPE." IS OK  . Cortisone     Flushing and worsening joint pain Flushing and worsening joint pain  . Meloxicam     Too sleepy Too sleepy  . Ace Inhibitors Cough  . Atorvastatin     Possible allergy  . Losartan Other (See Comments)    Sweats, cramps  . Vicodin [Hydrocodone-Acetaminophen] Other (See Comments)    Pt states that she stops breathing when she takes  Pain meds      Discussed warning signs or symptoms. Please see discharge instructions. Patient expresses understanding.

## 2018-07-02 DIAGNOSIS — W57XXXA Bitten or stung by nonvenomous insect and other nonvenomous arthropods, initial encounter: Secondary | ICD-10-CM | POA: Diagnosis not present

## 2018-07-02 DIAGNOSIS — T7840XA Allergy, unspecified, initial encounter: Secondary | ICD-10-CM | POA: Diagnosis not present

## 2018-07-04 ENCOUNTER — Encounter: Payer: Self-pay | Admitting: Family Medicine

## 2018-07-04 ENCOUNTER — Ambulatory Visit (INDEPENDENT_AMBULATORY_CARE_PROVIDER_SITE_OTHER): Payer: Medicare Other | Admitting: Family Medicine

## 2018-07-04 VITALS — BP 174/84 | HR 73 | Ht <= 58 in | Wt 108.0 lb

## 2018-07-04 DIAGNOSIS — S52532A Colles' fracture of left radius, initial encounter for closed fracture: Secondary | ICD-10-CM

## 2018-07-04 DIAGNOSIS — R238 Other skin changes: Secondary | ICD-10-CM | POA: Diagnosis not present

## 2018-07-04 DIAGNOSIS — M7061 Trochanteric bursitis, right hip: Secondary | ICD-10-CM | POA: Diagnosis not present

## 2018-07-04 MED ORDER — TRIAMCINOLONE ACETONIDE 0.1 % EX CREA: 1.0000 "application " | TOPICAL_CREAM | Freq: Two times a day (BID) | CUTANEOUS | 0 refills | Status: DC

## 2018-07-04 NOTE — Patient Instructions (Signed)
Thank you for coming in today. Recheck in 1 month.  Attend PT.  Get wrist xray on or around Nov 17th.  Recheck sooner if needed.    Trochanteric Bursitis Rehab Ask your health care provider which exercises are safe for you. Do exercises exactly as told by your health care provider and adjust them as directed. It is normal to feel mild stretching, pulling, tightness, or discomfort as you do these exercises, but you should stop right away if you feel sudden pain or your pain gets worse.Do not begin these exercises until told by your health care provider. Stretching exercises These exercises warm up your muscles and joints and improve the movement and flexibility of your hip. These exercises also help to relieve pain and stiffness. Exercise A: Iliotibial band stretch  1. Lie on your side with your left / right leg in the top position. 2. Bend your left / right knee and grab your ankle. 3. Slowly bring your knee back so your thigh is behind your body. 4. Slowly lower your knee toward the floor until you feel a gentle stretch on the outside of your left / right thigh. If you do not feel a stretch and your knee will not fall farther, place the heel of your other foot on top of your outer knee and pull your thigh down farther. 5. Hold this position for __________ seconds. 6. Slowly return to the starting position. Repeat __________ times. Complete this exercise __________ times a day. Strengthening exercises These exercises build strength and endurance in your hip and pelvis. Endurance is the ability to use your muscles for a long time, even after they get tired. Exercise B: Bridge ( hip extensors) 1. Lie on your back on a firm surface with your knees bent and your feet flat on the floor. 2. Tighten your buttocks muscles and lift your buttocks off the floor until your trunk is level with your thighs. You should feel the muscles working in your buttocks and the back of your thighs. If this exercise  is too easy, try doing it with your arms crossed over your chest. 3. Hold this position for __________ seconds. 4. Slowly return to the starting position. 5. Let your muscles relax completely between repetitions. Repeat __________ times. Complete this exercise __________ times a day. Exercise C: Squats ( knee extensors and  quadriceps) 1. Stand in front of a table, with your feet and knees pointing straight ahead. You may rest your hands on the table for balance but not for support. 2. Slowly bend your knees and lower your hips like you are going to sit in a chair. ? Keep your weight over your heels, not over your toes. ? Keep your lower legs upright so they are parallel with the table legs. ? Do not let your hips go lower than your knees. ? Do not bend lower than told by your health care provider. ? If your hip pain increases, do not bend as low. 3. Hold this position for __________ seconds. 4. Slowly push with your legs to return to standing. Do not use your hands to pull yourself to standing. Repeat __________ times. Complete this exercise __________ times a day. Exercise D: Hip hike 1. Stand sideways on a bottom step. Stand on your left / right leg with your other foot unsupported next to the step. You can hold onto the railing or wall if needed for balance. 2. Keeping your knees straight and your torso square, lift your left / right  hip up toward the ceiling. 3. Hold this position for __________ seconds. 4. Slowly let your left / right hip lower toward the floor, past the starting position. Your foot should get closer to the floor. Do not lean or bend your knees. Repeat __________ times. Complete this exercise __________ times a day. Exercise E: Single leg stand 1. Stand near a counter or door frame that you can hold onto for balance as needed. It is helpful to stand in front of a mirror for this exercise so you can watch your hip. 2. Squeeze your left / right buttock muscles then lift  up your other foot. Do not let your left / right hip push out to the side. 3. Hold this position for __________ seconds. Repeat __________ times. Complete this exercise __________ times a day. This information is not intended to replace advice given to you by your health care provider. Make sure you discuss any questions you have with your health care provider. Document Released: 09/22/2004 Document Revised: 04/21/2016 Document Reviewed: 07/31/2015 Elsevier Interactive Patient Education  [employer].

## 2018-07-04 NOTE — Progress Notes (Signed)
Mercedes Decker is a 77 y.o. female who presents to [facility] today for right facial swelling, right lateral hip pain, left wrist fracture follow-up  Mercedes Decker notes pain on the right lateral hip.  This occurred yesterday and has been quite severe.  She had similar pain in the past thought to be trochanteric bursitis.  She is had injections before which helped temporarily.  She notes pain is quite severe looking the lateral hip refers to the lateral thigh.  She notes pain is worse with walking and laying on her right side better with rest.  No weakness or numbness noted.  No injury.  She is tried over-the-counter medications which have not helped much.  Additionally she notes some puffiness under her right eye.  This occurred a few days ago and she was seen in [facility] where she was given a Solu-Medrol injection and advised to use antihistamine.  She notes this is helped.  She notes that is not particularly itchy and is not painful.  She notes that slowly improving.  Additionally she continues to follow-up for her left wrist fracture.  She notes she has been out of the cast and doing quite well with home exercises and motion.  She is happy with how things are going.   ROS as above:  Exam:  BP (!) 174/84   Ht 4\' 10"  (1.473 m)   Wt 108 lb (49 kg)   BMI 22.57 kg/m  Wt Readings from Last 5 Encounters:  07/04/18 108 lb (49 kg)  06/14/18 104 lb (47.2 kg)  05/24/18 106 lb (48.1 kg)  05/10/18 106 lb (48.1 kg)  05/03/18 105 lb 6.4 oz (47.8 kg)    Gen: Well NAD HEENT: EOMI,  MMM mild erythema and mild swelling inferior right eye area.  Tender.  No induration or fluctuance. Lungs: Normal work of breathing. CTABL Heart: RRR no MRG Abd: NABS, Soft. Nondistended, Nontender Exts: Brisk capillary refill, warm and well perfused.  Right hip normal-appearing normal motion. Tender  palpation greater trochanter. Hip abduction strength diminished 3+/5 with pain. Left wrist nontender  Greater trochanteric bursa injection: right Consent obtained and timeout performed.  Patient laying on side with affected hip up.   Area located and marked.   Skin cleaned with rubbing alcohol and chlorhexidine, and cold spray applied Using a spinal needle the greater trochanteric bursa was accessed.   80 mg of depomedrol and 3 mL of Marcaine were injected in a wheel pattern.   Some improvement following injection:  Patient tolerated procedure well with no weakness or numbness or bleeding.      Assessment and Plan: 77 y.o. female with  Right lateral hip pain trochanteric bursitis.  Plan for injection as above and home exercise program.  Additional refer to physical therapy and check back in about a month.  Right eye puffiness likely contact dermatitis or some other similar allergic issue.  Plan to use low potency triamcinolone cream and recheck as needed.  Left wrist fracture doing well clinically.  Plan to get x-ray as previously scheduled mid November.  Recheck in 1 month.  Okay to work on wrist range of motion and physical therapy as well.   Orders Placed This Encounter  Procedures  . DG Wrist 2 Views Left    Standing Status:   Future    Standing Expiration Date:   09/04/2019    Order Specific Question:   Reason for Exam (SYMPTOM  OR  DIAGNOSIS REQUIRED)    Answer:   follow up healting on or around Nov 17tth    Order Specific Question:   Preferred imaging location?    Answer:   [facility]    Order Specific Question:   Radiology Contrast Protocol - do NOT remove file path    Answer:   [email or web address]  . Ambulatory referral to Physical Therapy    Referral Priority:   Routine    Referral Type:   Physical Medicine    Referral Reason:   Specialty Services Required    Requested Specialty:   Physical Therapy   Meds ordered this  encounter  Medications  . triamcinolone cream (KENALOG) 0.1 %    Sig: Apply 1 application topically 2 (two) times daily.    Dispense:  30 g    Refill:  0     Historical information moved to improve visibility of documentation.  Past Medical History:  Diagnosis Date  . Arthritis   . Hypertension   . Migraine   . Osteoporosis    Past Surgical History:  Procedure Laterality Date  . ABDOMINAL HYSTERECTOMY    . BACK SURGERY    . BREAST SURGERY    . BUNIONECTOMY    . INCISIONAL HERNIA REPAIR    . KNEE ARTHROSCOPY     Social History   Tobacco Use  . Smoking status: Never Smoker  . Smokeless tobacco: Never Used  Substance Use Topics  . Alcohol use: No   family history includes Alzheimer's disease in her mother; Cancer in her brother and sister; Diabetes in her brother and sister; Heart disease in her brother and father.  Medications: Current Outpatient Medications  Medication Sig Dispense Refill  . amLODipine (NORVASC) 5 MG tablet Take 1 tablet (5 mg total) by mouth daily. Pt needs f/u appt w/PCP for further refills. 90 tablet 3  . aspirin EC 81 MG tablet Take 1 tablet (81 mg total) by mouth daily.    Marland Kitchen atorvastatin (LIPITOR) 20 MG tablet Take 1 tablet (20 mg total) by mouth daily. 90 tablet 1  . CALCIUM-MAGNESIUM-ZINC PO Take by mouth.    . celecoxib (CELEBREX) 200 MG capsule One to 2 tablets by mouth daily as needed for pain. 60 capsule 2  . cholecalciferol (VITAMIN D) 1000 UNITS tablet Take 2,000 Units by mouth daily.    . Cyanocobalamin (VITAMIN B 12 PO) Take by mouth.    . denosumab (PROLIA) 60 MG/ML SOLN injection Inject 60 mg into the skin every 6 (six) months. Administer in upper arm, thigh, or abdomen    . escitalopram (LEXAPRO) 10 MG tablet Take 1 tablet (10 mg total) by mouth daily. 90 tablet 0  . ipratropium (ATROVENT) 0.03 % nasal spray Place 2 sprays into both nostrils 4 (four) times daily as needed for rhinitis. 30 mL 0  . metoprolol succinate (TOPROL-XL) 50 MG  24 hr tablet Take 1 tablet (50 mg total) by mouth daily. Take with or immediately following a meal. 90 tablet 3  . SUMAtriptan 6 MG/0.5ML SOAJ Inject 1 Syringe into the skin once. Inject 6 mg into skin at onset of headache one time 1 Syringe 3  . vitamin C (ASCORBIC ACID) 500 MG tablet Take 500 mg by mouth daily.    Marland Kitchen triamcinolone cream (KENALOG) 0.1 % Apply 1 application topically 2 (two) times daily. 30 g 0   No current facility-administered medications for this visit.    Allergies  Allergen Reactions  . Tape     BANDAIDS  AND ADHESIVE TAPES TEAR SKIN (VERY THIN SKIN)  REQUESTING HYPOALLERGENIC TAPE.  "OUCHLESS TAPE." IS OK  . Cortisone     Flushing and worsening joint pain Flushing and worsening joint pain  . Meloxicam     Too sleepy Too sleepy  . Ace Inhibitors Cough  . Atorvastatin     Possible allergy  . Losartan Other (See Comments)    Sweats, cramps  . Vicodin [Hydrocodone-Acetaminophen] Other (See Comments)    Pt states that she stops breathing when she takes  Pain meds     Discussed warning signs or symptoms. Please see discharge instructions. Patient expresses understanding.

## 2018-07-10 ENCOUNTER — Ambulatory Visit (INDEPENDENT_AMBULATORY_CARE_PROVIDER_SITE_OTHER): Payer: Medicare Other | Admitting: Rehabilitative and Restorative Service Providers"

## 2018-07-10 ENCOUNTER — Encounter: Payer: Self-pay | Admitting: Rehabilitative and Restorative Service Providers"

## 2018-07-10 DIAGNOSIS — M6281 Muscle weakness (generalized): Secondary | ICD-10-CM | POA: Diagnosis not present

## 2018-07-10 DIAGNOSIS — R29898 Other symptoms and signs involving the musculoskeletal system: Secondary | ICD-10-CM | POA: Diagnosis not present

## 2018-07-10 DIAGNOSIS — M25551 Pain in right hip: Secondary | ICD-10-CM | POA: Diagnosis not present

## 2018-07-10 NOTE — Therapy (Signed)
[place], [place], [address] Phone: [phone]   Fax:  [phone]  Physical Therapy Evaluation  Patient Details  Name: Mercedes Decker MRN: [id number] Date of Birth: Jan 25, 1941 Referring Provider (PT): Dr Lynne Leader    Encounter Date: 07/10/2018  PT End of Session - 07/10/18 1037    Visit Number  1    Number of Visits  12    Date for PT Re-Evaluation  08/21/18    PT Start Time  0805    PT Stop Time  0902    PT Time Calculation (min)  57 min    Activity Tolerance  Patient tolerated treatment well       Past Medical History:  Diagnosis Date  . Arthritis   . Hypertension   . Migraine   . Osteoporosis     Past Surgical History:  Procedure Laterality Date  . ABDOMINAL HYSTERECTOMY    . BACK SURGERY    . BREAST SURGERY    . BUNIONECTOMY    . INCISIONAL HERNIA REPAIR    . KNEE ARTHROSCOPY      There were no vitals filed for this visit.   Subjective Assessment - 07/10/18 0807    Subjective  Patient reports Rt hip pain over the past 10-14 days with no known injury. She has pain at times with walking. Pain is sharp and catching. She fell backwards out of her closet fracturing Lt wrist 03/21/18.     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  Yes    Pain Score  3     Pain Location  Hip    Pain Orientation  Right    Pain Descriptors / Indicators  Aching;Nagging   sharp pain with walking or moving at times    Pain Type  Acute pain    Pain Radiating Towards  lateral hip into calf and ankle     Pain Onset  1 to 4 weeks ago    Pain Frequency  Intermittent    Aggravating Factors   walking; squatting;     Pain Relieving  Factors  lying down; ice          [facility] PT Assessment - 07/10/18 0001      Assessment   Medical Diagnosis  Rt trochanteric bursitis; Lt wrist fx     Referring Provider (PT)  Dr Lynne Leader     Onset Date/Surgical Date  06/28/18   Lt wrist fx 03/21/18   Hand Dominance  Right    Next MD Visit  08/14/18    Prior Therapy  none       Precautions   Precautions  None      Balance Screen   Has the patient fallen in the past 6 months  Yes    How many times?  1    Has the patient had a decrease in activity level because of a fear of falling?   No    Is the patient reluctant to leave their home because of a fear of falling?   No      Home Environment   Living Environment  Private residence    Living Arrangements  Spouse/significant other    Home Access  Stairs to enter    Entrance Stairs-Number of Steps  1    [place]  One level      Prior Function   Level of Independence  Independent      Observation/Other Assessments   Focus on Therapeutic Outcomes (FOTO)   42% limitation       Sensation   Additional Comments  WNL's per pt report       Posture/Postural Control   Posture Comments  head forward; shoulders rounded and elevated; wt shifted to Lt       AROM   Right/Left Hip  --   tight end range ext Rt > Lt    Right/Left Knee  --   WNL's bilat      Strength   Right Hip Flexion  --   5-/5   Right Hip Extension  4+/5    Right Hip ABduction  4/5    Right Hip ADduction  4+/5    Left Hip Flexion  5/5    Left Hip Extension  4+/5   5-/5   Left Hip ABduction  5/5    Right Knee Flexion  5/5    Right Knee Extension  5/5    Left Knee Flexion  5/5    Left Knee Extension  5/5      Flexibility   Hamstrings  tight Rt 70 deg; Lt 80 deg    Quadriceps  tight Rt > Lt     ITB  tight Rt     Piriformis  tight and painful Rt       Palpation   Spinal mobility  hypomobile lumbar spine     SI assessment   level landmarks in standing     Palpation comment  significant muscular  tightnesss Rt psoas; piriformis; TFL; ITB; gluts                 Objective measurements completed on examination: See above findings.      [facility] Adult PT Treatment/Exercise - 07/10/18 0001      Knee/Hip Exercises: Stretches   Passive Hamstring Stretch  Right;2 reps;30 seconds   supine with strap    Quad Stretch  Right;2 reps;30 seconds   prone with strap    ITB Stretch  Right;2 reps;30 seconds   supine with strap    Piriformis Stretch  Right;2 reps;30 seconds   supine travell      Moist Heat Therapy   Number Minutes Moist Heat  15 Minutes    Moist Heat Location  Hip;Lumbar Spine      Electrical Stimulation   Electrical Stimulation Location  Rt posterior hip     Electrical Stimulation Action  IFC    Electrical Stimulation Parameters  to tolerance    Electrical Stimulation Goals  Pain;Tone             PT Education - 07/10/18 0843    Education Details  HEP DN back care     Person(s) Educated  Patient    Methods  Explanation;Demonstration;Tactile cues;Verbal cues;Handout    Comprehension  Verbalized understanding;Returned demonstration;Verbal cues required;Tactile cues required                  Plan - 07/10/18 [place] presents with recurrent Rt hip pain with several epsiodes over the past 1-2 years. She has received 4 injections for trochanteric bursitis with temporary  resolution of symtpoms.  Not much improvement with most recent investment. Patient has limited mobility and ROM Rt hip with decreased strength. She has significant tenderness and tightness to palpation whtouth the psoas; posterior lateral hip musculature. Patient has limited functional activity, ROM and strength Lt wrist and hand following fx 03/21/18 with immobilization for several weeks. She has an xray scheduled for Thursday 07/12/18. Full evaluatio nof Lt wrist to be completed following xray.  Patient will benefit from PT to address problems  identified.     History and Personal Factors relevant to plan of care:  Recurrent Rt hip pain     Clinical Presentation  Stable    Clinical Decision Making  Low    Rehab Potential  Good    PT Frequency  2x / week    PT Duration  6 weeks    PT Treatment/Interventions  Patient/family education;ADLs/Self Care Home Management;Cryotherapy;Electrical Stimulation;Iontophoresis 4mg /ml Dexamethasone;Moist Heat;Ultrasound;Dry needling;Manual techniques;Neuromuscular re-education;Therapeutic activities;Therapeutic exercise    PT Next Visit Plan  review HEP; progress with stretching and stabilization of Rt hip/LE's/core. Further evaluation of Lt wrist with appropriate goals at time of evaluation; manual work vs DN Rt posterior hip. Modalities as indicated.     Consulted and Agree with Plan of Care  Patient       Patient will benefit from skilled therapeutic intervention in order to improve the following deficits and impairments:  Postural dysfunction, Improper body mechanics, Pain, Increased fascial restricitons, Increased muscle spasms, Decreased mobility, Decreased range of motion, Decreased strength, Decreased activity tolerance  Visit Diagnosis: Pain in right hip - Plan: PT plan of care cert/re-cert  Other symptoms and signs involving the musculoskeletal system - Plan: PT plan of care cert/re-cert  Muscle weakness (generalized) - Plan: PT plan of care cert/re-cert     Problem List Patient Active Problem List   Diagnosis Date Noted  . Fall 02/16/2018  . Primary osteoarthritis of both knees 11/21/2017  . Greater trochanteric bursitis of right hip 10/23/2017  . Pancreatic tumor 05/08/2017  . Hoarseness 02/10/2017  . Right shoulder injury 08/18/2016  . TIA (transient ischemic attack) 03/08/2016  . Right hand pain 03/07/2016  . Patient has active power of attorney for health care 11/30/2015  . Essential hypertension 12/25/2014  . Murmur, cardiac 09/11/2012  . DJD (degenerative joint  disease) 09/03/2012  . S/P hysterectomy 09/03/2012  . Osteoporosis  L fem neck  Dexa 07/2014  -3.1 09/03/2012  . Migraine 09/03/2012  . History of anemia 09/03/2012  . Hyperlipidemia 09/03/2012  . GERD (gastroesophageal reflux disease) 09/03/2012    Aijalon Kirtz Nilda Simmer PT, MPH  07/10/2018, 10:52 AM  [facility] [place], [place], [address] Phone: [phone]   Fax:  [phone]  Name: Mercedes Decker MRN: [id number] Date of Birth: 29-Sep-1940

## 2018-07-10 NOTE — Patient Instructions (Signed)
HIP: Hamstrings - Supine  Place strap around foot. Raise leg up, keeping knee straight.  Bend opposite knee to protect back if indicated. Hold 30 seconds. 3 reps per set, 2-3 sets per day  Outer Hip Stretch: Reclined IT Band Stretch (Strap)   Strap around one foot, pull leg across body until you feel a pull or stretch in the outside of your hip, with shoulders on mat. Hold for 30 seconds. Repeat 3 times each leg. 2-3 times/day.  Piriformis Stretch   Lying on back, pull right knee toward opposite shoulder. Hold 30 seconds. Repeat 3 times. Do 2-3 sessions per day.    Quads / HF, Prone KNEE: Quadriceps - Prone    Place strap around ankle. Bring ankle toward buttocks. Press hip into surface. Hold 30 seconds. Repeat 3 times per session. Do 2-3 sessions per day.  Trigger Point Dry Needling  . What is Trigger Point Dry Needling (DN)? o DN is a physical therapy technique used to treat muscle pain and dysfunction. Specifically, DN helps deactivate muscle trigger points (muscle knots).  o A thin filiform needle is used to penetrate the skin and stimulate the underlying trigger point. The goal is for a local twitch response (LTR) to occur and for the trigger point to relax. No medication of any kind is injected during the procedure.   . What Does Trigger Point Dry Needling Feel Like?  o The procedure feels different for each individual patient. Some patients report that they do not actually feel the needle enter the skin and overall the process is not painful. Very mild bleeding may occur. However, many patients feel a deep cramping in the muscle in which the needle was inserted. This is the local twitch response.   Marland Kitchen How Will I feel after the treatment? o Soreness is normal, and the onset of soreness may not occur for a few hours. Typically this soreness does not last longer than two days.  o Bruising is uncommon, however; ice can be used to decrease any possible bruising.  o In rare  cases feeling tired or nauseous after the treatment is normal. In addition, your symptoms may get worse before they get better, this period will typically not last longer than 24 hours.   . What Can I do After My Treatment? o Increase your hydration by drinking more water for the next 24 hours. o You may place ice or heat on the areas treated that have become sore, however, do not use heat on inflamed or bruised areas. Heat often brings more relief post needling. o You can continue your regular activities, but vigorous activity is not recommended initially after the treatment for 24 hours. o DN is best combined with other physical therapy such as strengthening, stretching, and other therapies.    Sleeping on Back  Place pillow under knees. A pillow with cervical support and a roll around waist are also helpful. Copyright  VHI. All rights reserved.  Sleeping on Side Place pillow between knees. Use cervical support under neck and a roll around waist as needed. Copyright  VHI. All rights reserved.   Sleeping on Stomach   If this is the only desirable sleeping position, place pillow under lower legs, and under stomach or chest as needed.  Posture - Sitting   Sit upright, head facing forward. Try using a roll to support lower back. Keep shoulders relaxed, and avoid rounded back. Keep hips level with knees. Avoid crossing legs for long periods. Stand to Sit /  Sit to Stand   To sit: Bend knees to lower self onto front edge of chair, then scoot back on seat. To stand: Reverse sequence by placing one foot forward, and scoot to front of seat. Use rocking motion to stand up.   Work Height and Reach  Ideal work height is no more than 2 to 4 inches below elbow level when standing, and at elbow level when sitting. Reaching should be limited to arm's length, with elbows slightly bent.  Bending  Bend at hips and knees, not back. Keep feet shoulder-width apart.    Posture - Standing   Good  posture is important. Avoid slouching and forward head thrust. Maintain curve in low back and align ears over shoul- ders, hips over ankles.  Alternating Positions   Alternate tasks and change positions frequently to reduce fatigue and muscle tension. Take rest breaks. Computer Work   Position work to Programmer, multimedia. Use proper work and seat height. Keep shoulders back and down, wrists straight, and elbows at right angles. Use chair that provides full back support. Add footrest and lumbar roll as needed.  Getting Into / Out of Car  Lower self onto seat, scoot back, then bring in one leg at a time. Reverse sequence to get out.  Dressing  Lie on back to pull socks or slacks over feet, or sit and bend leg while keeping back straight.    Housework - Sink  Place one foot on ledge of cabinet under sink when standing at sink for prolonged periods.   Pushing / Pulling  Pushing is preferable to pulling. Keep back in proper alignment, and use leg muscles to do the work.  Deep Squat   Squat and lift with both arms held against upper trunk. Tighten stomach muscles without holding breath. Use smooth movements to avoid jerking.  Avoid Twisting   Avoid twisting or bending back. Pivot around using foot movements, and bend at knees if needed when reaching for articles.  Carrying Luggage   Distribute weight evenly on both sides. Use a cart whenever possible. Do not twist trunk. Move body as a unit.   Lifting Principles .Maintain proper posture and head alignment. .Slide object as close as possible before lifting. .Move obstacles out of the way. .Test before lifting; ask for help if too heavy. .Tighten stomach muscles without holding breath. .Use smooth movements; do not jerk. .Use legs to do the work, and pivot with feet. .Distribute the work load symmetrically and close to the center of trunk. .Push instead of pull whenever possible.   Ask For Help   Ask for help and delegate  to others when possible. Coordinate your movements when lifting together, and maintain the low back curve.  Log Roll   Lying on back, bend left knee and place left arm across chest. Roll all in one movement to the right. Reverse to roll to the left. Always move as one unit. Housework - Sweeping  Use long-handled equipment to avoid stooping.   Housework - Wiping  Position yourself as close as possible to reach work surface. Avoid straining your back.  Laundry - Unloading Wash   To unload small items at bottom of washer, lift leg opposite to arm being used to reach.  [place] close to area to be raked. Use arm movements to do the work. Keep back straight and avoid twisting.     Cart  When reaching into cart with one arm, lift opposite leg to keep back  straight.   Getting Into / Out of Bed  Lower self to lie down on one side by raising legs and lowering head at the same time. Use arms to assist moving without twisting. Bend both knees to roll onto back if desired. To sit up, start from lying on side, and use same move-ments in reverse. Housework - Vacuuming  Hold the vacuum with arm held at side. Step back and forth to move it, keeping head up. Avoid twisting.   Laundry - IT consultant so that bending and twisting can be avoided.   Laundry - Unloading Dryer  Squat down to reach into clothes dryer or use a reacher.  Gardening - Weeding / Probation officer or Kneel. Knee pads may be helpful.

## 2018-07-12 ENCOUNTER — Ambulatory Visit (INDEPENDENT_AMBULATORY_CARE_PROVIDER_SITE_OTHER): Payer: Medicare Other

## 2018-07-12 DIAGNOSIS — W19XXXD Unspecified fall, subsequent encounter: Secondary | ICD-10-CM | POA: Diagnosis not present

## 2018-07-12 DIAGNOSIS — S52592D Other fractures of lower end of left radius, subsequent encounter for closed fracture with routine healing: Secondary | ICD-10-CM | POA: Diagnosis not present

## 2018-07-12 DIAGNOSIS — S52532D Colles' fracture of left radius, subsequent encounter for closed fracture with routine healing: Secondary | ICD-10-CM

## 2018-07-12 DIAGNOSIS — S52532A Colles' fracture of left radius, initial encounter for closed fracture: Secondary | ICD-10-CM

## 2018-07-13 ENCOUNTER — Encounter: Payer: Self-pay | Admitting: Rehabilitative and Restorative Service Providers"

## 2018-07-13 ENCOUNTER — Ambulatory Visit (INDEPENDENT_AMBULATORY_CARE_PROVIDER_SITE_OTHER): Payer: Medicare Other | Admitting: Rehabilitative and Restorative Service Providers"

## 2018-07-13 DIAGNOSIS — M6281 Muscle weakness (generalized): Secondary | ICD-10-CM | POA: Diagnosis not present

## 2018-07-13 DIAGNOSIS — R29898 Other symptoms and signs involving the musculoskeletal system: Secondary | ICD-10-CM

## 2018-07-13 DIAGNOSIS — M25551 Pain in right hip: Secondary | ICD-10-CM

## 2018-07-13 NOTE — Patient Instructions (Addendum)
Wrist Flexor Stretch    Sitting with elbows on table and palms together, slowly lower wrists to table until stretch is felt. Keep palms together throughout the stretch. Hold __10-15__ seconds. Relax. Repeat _5___ times per set. Do __1__ sets per session. Do ___2-3_ sessions per day.   AROM: Wrist Flexion / Extension    Actively bend right wrist forward then back as far as possible. Repeat __5__ times per set. Do __1-2__ sets per session. Do __2-3__ sessions per day.    Forearm Supination Stretch hold on forearm not hand     With right hand in handshake position, grasp and slowly turn to palm up until stretch is felt. Hold __10-15__ seconds. Relax. Repeat _5___ times per set. Do __1__ sets per session. Do __2-3__ sessions per day.   AROM: Thumb Flexion / Extension    Actively bend right thumb across palm as far as possible. Hold ___3-5_ seconds. Relax. Then pull thumb back into hitchhike position. Repeat __5__ times per set. Do __1__ sets per session. Do _2-3___ sessions per day.     AROM: Wrist Radial / Ulnar Deviation    Gently bend left wrist from side to side as far as possible. Repeat __5__ times per set. Do __1__ sets per session. Do __2-3__ sessions per day.   Towel Roll Squeeze    With right forearm resting on surface, gently squeeze towel. Repeat __10-20__ times per set. Do _1-2___ sets per session. Do __2-3__ sessions per day.   Make circles with wrist CW and CCW 10-20 reps

## 2018-07-13 NOTE — Therapy (Signed)
[place], [place], [address] Phone: [phone]   Fax:  [phone]  Physical Therapy Treatment  Patient Details  Name: Mercedes Decker MRN: [id number] Date of Birth: [birth year] Referring Provider (PT): Dr Lynne Leader    Encounter Date: 07/13/2018  PT End of Session - 07/13/18 1018    Visit Number  2    Number of Visits  12    Date for PT Re-Evaluation  08/21/18    PT Start Time  5053    PT Stop Time  1114    PT Time Calculation (min)  59 min    Activity Tolerance  Patient tolerated treatment well       Past Medical History:  Diagnosis Date  . Arthritis   . Hypertension   . Migraine   . Osteoporosis     Past Surgical History:  Procedure Laterality Date  . ABDOMINAL HYSTERECTOMY    . BACK SURGERY    . BREAST SURGERY    . BUNIONECTOMY    . INCISIONAL HERNIA REPAIR    . KNEE ARTHROSCOPY      There were no vitals filed for this visit.  Subjective Assessment - 07/13/18 1018    Subjective  Patient reports the the hip is loosening up some. She has been doing her exercises 3x/day and the hip is a little better. Pain is not as sharp. Xrays of wrist show healing. Weaning out of the splint.     Currently in Pain?  Yes    Pain Score  4     Pain Location  Hip    Pain Orientation  Right    Pain Descriptors / Indicators  Aching;Nagging    Pain Type  Acute pain    Multiple Pain Sites  Yes         [facility] PT Assessment - 07/13/18 0001      Assessment   Medical Diagnosis  Rt trochanteric bursitis; Lt wrist fx     Referring Provider (PT)  Dr Lynne Leader     Onset Date/Surgical Date  06/28/18   Lt wrist fx 03/21/18   Hand Dominance  Right    Next MD Visit  08/14/18    Prior Therapy  none       AROM   Right Forearm Pronation  90 Degrees    Right Forearm Supination  82 Degrees    Left Forearm Pronation  90 Degrees    Left Forearm Supination  67 Degrees    Right Wrist Extension  62  Degrees    Right Wrist Flexion  48 Degrees    Right Wrist Radial Deviation  22 Degrees    Right Wrist Ulnar Deviation  45 Degrees    Left Wrist Extension  47 Degrees    Left Wrist Flexion  42 Degrees    Left Wrist Radial Deviation  7 Degrees    Left Wrist Ulnar Deviation  33 Degrees    Right/Left Finger  --   thumb opposes to base of little finger - tight Lt                   [facility] Adult PT Treatment/Exercise - 07/13/18 0001      Knee/Hip Exercises: Stretches   Passive Hamstring Stretch  Right;2 reps;30 seconds   supine with strap    ITB Stretch  Right;2 reps;30 seconds   supine with strap    Piriformis Stretch  Right;2 reps;30 seconds   supine travell  Piriformis Stretch Limitations  added knee toward opposite shoulder 30 sec x 2 reps       Knee/Hip Exercises: Supine   Other Supine Knee/Hip Exercises  rocking knees side to side - relaxation x 10 reps       Wrist Exercises   Wrist Flexion  AAROM;Left;5 reps    Wrist Extension  AAROM;Left;5 reps    Wrist Radial Deviation  AAROM;Left;5 reps    Wrist Ulnar Deviation  AAROM;Left;5 reps    Other wrist exercises  forearm supination 10 sec hold AAROM x 5 Lt forearm     Other wrist exercises  opposition thumb to base of little finger x5; wrist circles x 10; wrist flex/ext x 5       Moist Heat Therapy   Number Minutes Moist Heat  20 Minutes    Moist Heat Location  Hip;Lumbar Spine      Cryotherapy   Number Minutes Cryotherapy  20 Minutes    Cryotherapy Location  Wrist   Lt   Type of Cryotherapy  Ice pack      Electrical Stimulation   Electrical Stimulation Location  Rt posterior hip     Electrical Stimulation Action  IFC    Electrical Stimulation Parameters  to tolerance    Electrical Stimulation Goals  Pain;Tone             PT Education - 07/13/18 1059    Education Details  HEP     Person(s) Educated  Patient    Methods  Explanation;Demonstration;Tactile cues;Verbal cues;Handout    Comprehension   Verbalized understanding;Returned demonstration;Verbal cues required;Tactile cues required          PT Long Term Goals - 07/13/18 1102      PT LONG TERM GOAL #1   Title  Decrease pain in the LB and Rt hip by 50-75% allowing patient to participate in all normal and functional activities 08/24/18    Time  6    Period  Weeks    Status  New      PT LONG TERM GOAL #2   Title  Increase ROM in lumbar spine and bilat hips to [facility] 08/24/18    Time  6    Period  Weeks    Status  New      PT LONG TERM GOAL #3   Title  Increase AROM Lt forearm and wrist to allow patient to use Lt UE for all functional activities without limit 08/24/18    Time  6    Period  Weeks    Status  New      PT LONG TERM GOAL #4   Title  Independent in HEP 08/24/18    Time  6    Period  Weeks    Status  New      PT LONG TERM GOAL #5   Title  Improve FOTO to </= 35% limitation 08/24/18    Time  6    Period  Weeks    Status  New            Plan - 07/13/18 1018    Clinical Impression Statement  [name] reports some improvement in Rt hip pain. She is working on her exercises 3x/day. Wrist is healing well and she is weaning from her splint per OK from MD yesterday. Assessment of Lt wrist completed today with exercises added for HEP without difficulty - some soreness reported following exercises addressed with cold pac.     Rehab Potential  Good  PT Frequency  2x / week    PT Duration  6 weeks    PT Treatment/Interventions  Patient/family education;ADLs/Self Care Home Management;Cryotherapy;Electrical Stimulation;Iontophoresis 4mg /ml Dexamethasone;Moist Heat;Ultrasound;Dry needling;Manual techniques;Neuromuscular re-education;Therapeutic activities;Therapeutic exercise    PT Next Visit Plan  review HEP; progress with stretching and stabilization of Rt hip/LE's/core. Further evaluation of Lt wrist with appropriate goals at time of evaluation; manual work vs DN Rt posterior hip. Modalities as indicated.      Consulted and Agree with Plan of Care  Patient       Patient will benefit from skilled therapeutic intervention in order to improve the following deficits and impairments:  Postural dysfunction, Improper body mechanics, Pain, Increased fascial restricitons, Increased muscle spasms, Decreased mobility, Decreased range of motion, Decreased strength, Decreased activity tolerance  Visit Diagnosis: Pain in right hip - Plan: PT plan of care cert/re-cert  Other symptoms and signs involving the musculoskeletal system - Plan: PT plan of care cert/re-cert  Muscle weakness (generalized) - Plan: PT plan of care cert/re-cert     Problem List Patient Active Problem List   Diagnosis Date Noted  . Fall 02/16/2018  . Primary osteoarthritis of both knees 11/21/2017  . Greater trochanteric bursitis of right hip 10/23/2017  . Pancreatic tumor 05/08/2017  . Hoarseness 02/10/2017  . Right shoulder injury 08/18/2016  . TIA (transient ischemic attack) 03/08/2016  . Right hand pain 03/07/2016  . Patient has active power of attorney for health care 11/30/2015  . Essential hypertension 12/25/2014  . Murmur, cardiac 09/11/2012  . DJD (degenerative joint disease) 09/03/2012  . S/P hysterectomy 09/03/2012  . Osteoporosis  L fem neck  Dexa 07/2014  -3.1 09/03/2012  . Migraine 09/03/2012  . History of anemia 09/03/2012  . Hyperlipidemia 09/03/2012  . GERD (gastroesophageal reflux disease) 09/03/2012    Janki Dike Nilda Simmer PT, MPH  07/13/2018, 1:41 PM  [facility] [place], [place], [address] Phone: [phone]   Fax:  [phone]  Name: JUSTIS DUPAS MRN: [id number] Date of Birth: 1941/07/31

## 2018-07-16 ENCOUNTER — Ambulatory Visit: Payer: Medicare Other | Admitting: Family Medicine

## 2018-07-17 ENCOUNTER — Encounter: Payer: Self-pay | Admitting: Physical Therapy

## 2018-07-17 ENCOUNTER — Ambulatory Visit (INDEPENDENT_AMBULATORY_CARE_PROVIDER_SITE_OTHER): Payer: Medicare Other | Admitting: Physical Therapy

## 2018-07-17 DIAGNOSIS — M25551 Pain in right hip: Secondary | ICD-10-CM

## 2018-07-17 DIAGNOSIS — M6281 Muscle weakness (generalized): Secondary | ICD-10-CM | POA: Diagnosis not present

## 2018-07-17 DIAGNOSIS — R29898 Other symptoms and signs involving the musculoskeletal system: Secondary | ICD-10-CM

## 2018-07-17 NOTE — Therapy (Signed)
[place], [place], [address] Phone: [phone]   Fax:  [phone]  Physical Therapy Treatment  Patient Details  Name: Mercedes Decker MRN: [id number] Date of Birth: 18-Apr-1941 Referring Provider (PT): Dr Lynne Leader    Encounter Date: 07/17/2018  PT End of Session - 07/17/18 0808    Visit Number  3    Number of Visits  12    Date for PT Re-Evaluation  08/21/18    PT Start Time  0804    PT Stop Time  0901    PT Time Calculation (min)  57 min       Past Medical History:  Diagnosis Date  . Arthritis   . Hypertension   . Migraine   . Osteoporosis     Past Surgical History:  Procedure Laterality Date  . ABDOMINAL HYSTERECTOMY    . BACK SURGERY    . BREAST SURGERY    . BUNIONECTOMY    . INCISIONAL HERNIA REPAIR    . KNEE ARTHROSCOPY      There were no vitals filed for this visit.  Subjective Assessment - 07/17/18 0808    Subjective  "I think it's helping (therapy)."   She notices her hip mostly at end of her walks with dog.  She states her Lt wrist is tender, but getting better.     Currently in Pain?  Yes    Pain Score  4     Pain Location  Hip    Pain Orientation  Right;Anterior    Pain Descriptors / Indicators  Aching    Aggravating Factors   walking, squatting     Pain Relieving Factors  lying down, ice          [facility] PT Assessment - 07/17/18 0001      Assessment   Medical Diagnosis  Rt trochanteric bursitis; Lt wrist fx     Referring Provider (PT)  Dr Lynne Leader     Onset Date/Surgical Date  06/28/18   Lt wrist fx 03/21/18   Hand Dominance  Right    Next MD Visit  08/14/18    Prior Therapy  none       Flexibility   Quadriceps  Rt - 118 deg, Lt - 130 deg       Palpation   SI assessment   Rt ASIS lower than Lt.         [place] Adult PT Treatment/Exercise - 07/17/18 0001      Self-Care   Self-Care  Other Self-Care Comments    Other Self-Care Comments   pt  educated in self massage technique with roller stick to Rt LE to decrease pain and fascial restrictions; pt returned demo and verbalized understanding.       Exercises   Exercises  Knee/Hip      Knee/Hip Exercises: Stretches   Passive Hamstring Stretch  Right;2 reps;30 seconds   seated version shown   Sports administrator  Right;3 reps;Left;1 rep;30 seconds   prone with strap   Hip Flexor Stretch  Right;3 reps;30 seconds   seated x 1, supine x 2   Piriformis Stretch  Right;3 reps;30 seconds   fig 4, knee towards opp shoulder   Piriformis Stretch Limitations  trial of 1 rep of seated after supine      Knee/Hip Exercises: Aerobic   Nustep  L4: 5.5 min (legs only)      Moist Heat Therapy   Number Minutes Moist Heat  15  Minutes    Moist Heat Location  Lumbar Spine      Cryotherapy   Number Minutes Cryotherapy  15 Minutes    Cryotherapy Location  Hip   Ant Rt hip/thigh     Electrical Stimulation   Electrical Stimulation Location  Rt anterior hip/thigh    Electrical Stimulation Action  IFC    Electrical Stimulation Parameters   to tolerance x 15 min     Electrical Stimulation Goals  Pain      Manual Therapy   Manual Therapy  Soft tissue mobilization;Myofascial release    Soft tissue mobilization  STM to Rt TFL, mid to prox Rt quad    Myofascial Release  to Rt prox quad              PT Education - 07/17/18 1106    Education Details  Pt issued stress ball to begin working on gentle grip exercises.     Person(s) Educated  Patient    Methods  Explanation    Comprehension  Verbalized understanding          PT Long Term Goals - 07/13/18 1102      PT LONG TERM GOAL #1   Title  Decrease pain in the LB and Rt hip by 50-75% allowing patient to participate in all normal and functional activities 08/24/18    Time  6    Period  Weeks    Status  New      PT LONG TERM GOAL #2   Title  Increase ROM in lumbar spine and bilat hips to [facility] 08/24/18    Time  6    Period  Weeks     Status  New      PT LONG TERM GOAL #3   Title  Increase AROM Lt forearm and wrist to allow patient to use Lt UE for all functional activities without limit 08/24/18    Time  6    Period  Weeks    Status  New      PT LONG TERM GOAL #4   Title  Independent in HEP 08/24/18    Time  6    Period  Weeks    Status  New      PT LONG TERM GOAL #5   Title  Improve FOTO to </= 35% limitation 08/24/18    Time  6    Period  Weeks    Status  New            Plan - 07/17/18 1106    Clinical Impression Statement  Pt tolerated all exercises well; Rt quad tightness noted with prone stretch.  Added supine hip flexor stretch to HEP and stress ball squeezes for gentle hand strengthening.  Fascial tightness noted in ant lateral prox Rt quad; will address this with further manual therapy in future visits.     Rehab Potential  Good    PT Frequency  2x / week    PT Treatment/Interventions  Patient/family education;ADLs/Self Care Home Management;Cryotherapy;Electrical Stimulation;Iontophoresis 4mg /ml Dexamethasone;Moist Heat;Ultrasound;Dry needling;Manual techniques;Neuromuscular re-education;Therapeutic activities;Therapeutic exercise    PT Next Visit Plan   progress with stretching and stabilization of Rt hip/LE's/core. manual work vs DN Rt. Modalities as indicated.     Consulted and Agree with Plan of Care  Patient       Patient will benefit from skilled therapeutic intervention in order to improve the following deficits and impairments:  Postural dysfunction, Improper body mechanics, Pain, Increased fascial restricitons, Increased muscle spasms, Decreased  mobility, Decreased range of motion, Decreased strength, Decreased activity tolerance  Visit Diagnosis: Pain in right hip  Other symptoms and signs involving the musculoskeletal system  Muscle weakness (generalized)     Problem List Patient Active Problem List   Diagnosis Date Noted  . Fall 02/16/2018  . Primary osteoarthritis of both  knees 11/21/2017  . Greater trochanteric bursitis of right hip 10/23/2017  . Pancreatic tumor 05/08/2017  . Hoarseness 02/10/2017  . Right shoulder injury 08/18/2016  . TIA (transient ischemic attack) 03/08/2016  . Right hand pain 03/07/2016  . Patient has active power of attorney for health care 11/30/2015  . Essential hypertension 12/25/2014  . Murmur, cardiac 09/11/2012  . DJD (degenerative joint disease) 09/03/2012  . S/P hysterectomy 09/03/2012  . Osteoporosis  L fem neck  Dexa 07/2014  -3.1 09/03/2012  . Migraine 09/03/2012  . History of anemia 09/03/2012  . Hyperlipidemia 09/03/2012  . GERD (gastroesophageal reflux disease) 09/03/2012   Kerin Perna, PTA 07/17/18 11:09 AM  [place], [place], [address] Phone: [phone]   Fax:  [phone]  Name: Mercedes Decker MRN: [id number] Date of Birth: 13-Jul-1941

## 2018-07-20 ENCOUNTER — Encounter: Payer: Self-pay | Admitting: Rehabilitative and Restorative Service Providers"

## 2018-07-20 ENCOUNTER — Ambulatory Visit (INDEPENDENT_AMBULATORY_CARE_PROVIDER_SITE_OTHER): Payer: Medicare Other | Admitting: Rehabilitative and Restorative Service Providers"

## 2018-07-20 DIAGNOSIS — M25551 Pain in right hip: Secondary | ICD-10-CM | POA: Diagnosis not present

## 2018-07-20 DIAGNOSIS — M6281 Muscle weakness (generalized): Secondary | ICD-10-CM | POA: Diagnosis not present

## 2018-07-20 DIAGNOSIS — R29898 Other symptoms and signs involving the musculoskeletal system: Secondary | ICD-10-CM

## 2018-07-20 NOTE — Patient Instructions (Addendum)
Wrist Flexors    Elbow straight, palm up. Grasp fingers with other hand and slowly bend wrist backward. Hold __20-30_ seconds. Repeat _2-3__ times per session. Do __2-3_ sessions per day.   Achilles / Gastroc, Standing    Stand, right foot behind, heel on floor and turned slightly out, leg straight, forward leg bent. Move hips forward. Hold _30__ seconds. Repeat 3___ times per session. Do _2-3__ sessions per day.   Achilles / Soleus, Standing    Stand, right foot behind, heel on floor and turned slightly out. Lower hips and bend knees. Hold __30_ seconds. Repeat _3__ times per session. Do __2-3_ sessions per day.

## 2018-07-20 NOTE — Therapy (Signed)
[place], [place], [address] Phone: [phone]   Fax:  [phone]  Physical Therapy Treatment  Patient Details  Name: Mercedes Decker MRN: [id number] Date of Birth: 1941/02/21 Referring Provider (PT): Dr Lynne Leader    Encounter Date: 07/20/2018  PT End of Session - 07/20/18 0808    Visit Number  4    Number of Visits  12    Date for PT Re-Evaluation  08/21/18    PT Start Time  0800    PT Stop Time  0901    PT Time Calculation (min)  61 min    Activity Tolerance  Patient tolerated treatment well       Past Medical History:  Diagnosis Date  . Arthritis   . Hypertension   . Migraine   . Osteoporosis     Past Surgical History:  Procedure Laterality Date  . ABDOMINAL HYSTERECTOMY    . BACK SURGERY    . BREAST SURGERY    . BUNIONECTOMY    . INCISIONAL HERNIA REPAIR    . KNEE ARTHROSCOPY      There were no vitals filed for this visit.  Subjective Assessment - 07/20/18 0810    Subjective  some improvement in the hip - no longer hurting so much now feels more discomfort in the top of the thigh. Lt srist is sore but improving - still some aching.     Currently in Pain?  Yes    Pain Score  4     Pain Location  Hip    Pain Orientation  Right;Anterior    Pain Descriptors / Indicators  Aching    Pain Type  Acute pain    Pain Radiating Towards  quad     Pain Onset  1 to 4 weeks ago    Pain Frequency  Intermittent                       [facility] Adult PT Treatment/Exercise - 07/20/18 0001      Knee/Hip Exercises: Stretches   Passive Hamstring Stretch  Right;2 reps;30 seconds   seated version shown   Sports administrator  Right;3 reps;Left;1 rep;30 seconds   prone with strap   ITB Stretch  Right;2 reps;30 seconds    Piriformis Stretch  Right;3 reps;30 seconds   supine travell    Gastroc Stretch  Right;Left;2 reps;30 seconds    Soleus Stretch  Right;Left;2 reps;30 seconds       Knee/Hip Exercises: Aerobic   Nustep  L4: 6 min (legs only)      Wrist Exercises   Other wrist exercises  passive stretch Rt flexor forearm 20-30 sec x 2-3 reps - to address cramping in the Rt forearm and fingers at night       Moist Heat Therapy   Number Minutes Moist Heat  20 Minutes    Moist Heat Location  Lumbar Spine   Rt quad; Lt wrist     Electrical Stimulation   Electrical Stimulation Location  Rt posterior hip     Electrical Stimulation Action  IFC    Electrical Stimulation Parameters  to tolerance    Electrical Stimulation Goals  Pain;Tone      Manual Therapy   Manual therapy comments  pt prone and supine     Soft tissue mobilization  Rt piriformis/glut med/TFL; Rt quad     Myofascial Release  Rt quad     Passive ROM  PROM and  gentle stretching Lt wrist and forearm              PT Education - 07/20/18 0840    Education Details  HEP    Person(s) Educated  Patient    Methods  Explanation;Demonstration;Tactile cues;Verbal cues;Handout    Comprehension  Verbalized understanding;Returned demonstration;Verbal cues required;Tactile cues required          PT Long Term Goals - 07/13/18 1102      PT LONG TERM GOAL #1   Title  Decrease pain in the LB and Rt hip by 50-75% allowing patient to participate in all normal and functional activities 08/24/18    Time  6    Period  Weeks    Status  New      PT LONG TERM GOAL #2   Title  Increase ROM in lumbar spine and bilat hips to [facility] 08/24/18    Time  6    Period  Weeks    Status  New      PT LONG TERM GOAL #3   Title  Increase AROM Lt forearm and wrist to allow patient to use Lt UE for all functional activities without limit 08/24/18    Time  6    Period  Weeks    Status  New      PT LONG TERM GOAL #4   Title  Independent in HEP 08/24/18    Time  6    Period  Weeks    Status  New      PT LONG TERM GOAL #5   Title  Improve FOTO to </= 35% limitation 08/24/18    Time  6    Period  Weeks    Status   New            Plan - 07/20/18 2426    Clinical Impression Statement  Sore on the top of the thigh. Can feel it when walking - no longer constant pain and the hip itself does feel better - now feels discomfort in the quads. Wrist/hand is some better - still some aching. Note continued tightness Rt posterior hip/buttocks through the piriformis and gluts as well sa Rt quads. Tolerating exercises/stretches well. Tolerates only moderate pressure with manual work. Progressing well.     Rehab Potential  Good    PT Frequency  2x / week    PT Treatment/Interventions  Patient/family education;ADLs/Self Care Home Management;Cryotherapy;Electrical Stimulation;Iontophoresis 4mg /ml Dexamethasone;Moist Heat;Ultrasound;Dry needling;Manual techniques;Neuromuscular re-education;Therapeutic activities;Therapeutic exercise    PT Next Visit Plan   progress with stretching and stabilization of Rt hip/LE's/core. manual work vs DN Rt piriformis/hip abductors. Progress with exercises as symptoms improve. Modalities as indicated.     Consulted and Agree with Plan of Care  Patient       Patient will benefit from skilled therapeutic intervention in order to improve the following deficits and impairments:  Postural dysfunction, Improper body mechanics, Pain, Increased fascial restricitons, Increased muscle spasms, Decreased mobility, Decreased range of motion, Decreased strength, Decreased activity tolerance  Visit Diagnosis: Pain in right hip  Other symptoms and signs involving the musculoskeletal system  Muscle weakness (generalized)     Problem List Patient Active Problem List   Diagnosis Date Noted  . Fall 02/16/2018  . Primary osteoarthritis of both knees 11/21/2017  . Greater trochanteric bursitis of right hip 10/23/2017  . Pancreatic tumor 05/08/2017  . Hoarseness 02/10/2017  . Right shoulder injury 08/18/2016  . TIA (transient ischemic attack) 03/08/2016  . Right hand pain 03/07/2016  .  Patient  has active power of attorney for health care 11/30/2015  . Essential hypertension 12/25/2014  . Murmur, cardiac 09/11/2012  . DJD (degenerative joint disease) 09/03/2012  . S/P hysterectomy 09/03/2012  . Osteoporosis  L fem neck  Dexa 07/2014  -3.1 09/03/2012  . Migraine 09/03/2012  . History of anemia 09/03/2012  . Hyperlipidemia 09/03/2012  . GERD (gastroesophageal reflux disease) 09/03/2012    Paraskevi Funez Nilda Simmer PT, MPH  07/20/2018, 10:27 AM  [facility] [place], [place], [address] Phone: [phone]   Fax:  [phone]  Name: Mercedes Decker MRN: [id number] Date of Birth: February 25, 1941

## 2018-07-24 ENCOUNTER — Ambulatory Visit (INDEPENDENT_AMBULATORY_CARE_PROVIDER_SITE_OTHER): Payer: Medicare Other | Admitting: Rehabilitative and Restorative Service Providers"

## 2018-07-24 ENCOUNTER — Encounter: Payer: Self-pay | Admitting: Rehabilitative and Restorative Service Providers"

## 2018-07-24 DIAGNOSIS — R29898 Other symptoms and signs involving the musculoskeletal system: Secondary | ICD-10-CM | POA: Diagnosis not present

## 2018-07-24 DIAGNOSIS — M6281 Muscle weakness (generalized): Secondary | ICD-10-CM | POA: Diagnosis not present

## 2018-07-24 DIAGNOSIS — M25551 Pain in right hip: Secondary | ICD-10-CM | POA: Diagnosis not present

## 2018-07-24 NOTE — Therapy (Signed)
[place], [place], [address] Phone: [phone]   Fax:  [phone]  Physical Therapy Treatment  Patient Details  Name: Mercedes Decker MRN: [id number] Date of Birth: [birth year] Referring Provider (PT): Dr Lynne Leader    Encounter Date: 07/24/2018  PT End of Session - 07/24/18 0804    Visit Number  5    Number of Visits  12    Date for PT Re-Evaluation  08/21/18    PT Start Time  0801    PT Stop Time  0902    PT Time Calculation (min)  61 min    Activity Tolerance  Patient tolerated treatment well       Past Medical History:  Diagnosis Date  . Arthritis   . Hypertension   . Migraine   . Osteoporosis     Past Surgical History:  Procedure Laterality Date  . ABDOMINAL HYSTERECTOMY    . BACK SURGERY    . BREAST SURGERY    . BUNIONECTOMY    . INCISIONAL HERNIA REPAIR    . KNEE ARTHROSCOPY      There were no vitals filed for this visit.  Subjective Assessment - 07/24/18 0805    Subjective  Continued improvement. Less pain in the hip - some discomfort in the top of the thigh in front. Notices discomfort when she is walking the dog - unlevel surfaces. Lt wrist is doing better - she is using the Lt UE for more functional activities.     Pain Score  1     Pain Location  Hip    Pain Orientation  Right;Anterior    Pain Descriptors / Indicators  Aching    Pain Type  Acute pain    Pain Onset  More than a month ago    Pain Frequency  Intermittent         [facility] PT Assessment - 07/24/18 0001      Assessment   Medical Diagnosis  Rt trochanteric bursitis; Lt wrist fx     Referring Provider (PT)  Dr Lynne Leader     Onset Date/Surgical Date  06/28/18   Lt wrist fx 03/21/18   Hand Dominance  Right    Next MD Visit  08/14/18    Prior Therapy  none       Flexibility   Piriformis  improving mobility - no pain       Palpation   Palpation comment  improving muscular tightnesss Rt psoas;  piriformis; TFL; ITB; gluts                    [facility] Adult PT Treatment/Exercise - 07/24/18 0001      Knee/Hip Exercises: Stretches   ITB Stretch  Right;2 reps;30 seconds    Piriformis Stretch  Right;3 reps;30 seconds   supine travell      Knee/Hip Exercises: Aerobic   Nustep  L5: 6 min (UE/LE's)      Knee/Hip Exercises: Standing   Hip Abduction  AROM;Stengthening;Right;Left;20 reps;Knee straight    Hip Extension  AROM;Stengthening;Right;Left;20 reps;Knee straight      Wrist Exercises   Wrist Flexion  Strengthening;Left;20 reps;Seated;Bar weights/barbell    Bar Weights/Barbell (Wrist Flexion)  1 lb    Wrist Extension  Strengthening;Left;20 reps;Seated;Bar weights/barbell    Bar Weights/Barbell (Wrist Extension)  1 lb    Wrist Radial Deviation  Strengthening;Left;20 reps;Seated;Bar weights/barbell    Bar Weights/Barbell (Radial Deviation)  1 lb    Other wrist exercises  forearm supination/pronation - strengthening x 20 1# wt     Other wrist exercises  passive stretch Rt flexor forearm 20-30 sec x 2-3 reps - to address cramping in the Rt forearm and fingers at night       Electrical Stimulation   Electrical Stimulation Location  Rt posterior hip     Electrical Stimulation Action  IFC    Electrical Stimulation Parameters  to tolerance    Electrical Stimulation Goals  Pain;Tone      Manual Therapy   Manual therapy comments  pt prone and supine     Joint Mobilization  Rt hip PA and AP glides     Soft tissue mobilization  Rt piriformis/glut med/TFL; Rt quad     Myofascial Release  Rt quad     Passive ROM  PROM and gentle stretching Lt wrist and forearm              PT Education - 07/24/18 0826    Education Details  HEP     Person(s) Educated  Patient    Methods  Explanation;Demonstration;Tactile cues;Verbal cues;Handout    Comprehension  Verbalized understanding;Returned demonstration;Verbal cues required;Tactile cues required          PT Long Term  Goals - 07/13/18 1102      PT LONG TERM GOAL #1   Title  Decrease pain in the LB and Rt hip by 50-75% allowing patient to participate in all normal and functional activities 08/24/18    Time  6    Period  Weeks    Status  New      PT LONG TERM GOAL #2   Title  Increase ROM in lumbar spine and bilat hips to [facility] 08/24/18    Time  6    Period  Weeks    Status  New      PT LONG TERM GOAL #3   Title  Increase AROM Lt forearm and wrist to allow patient to use Lt UE for all functional activities without limit 08/24/18    Time  6    Period  Weeks    Status  New      PT LONG TERM GOAL #4   Title  Independent in HEP 08/24/18    Time  6    Period  Weeks    Status  New      PT LONG TERM GOAL #5   Title  Improve FOTO to </= 35% limitation 08/24/18    Time  6    Period  Weeks    Status  New            Plan - 07/24/18 0805    Clinical Impression Statement  Progressing well. Added wrist strengthening exercises without difficulty - some fatigue. Less palpable tightness noted Rt posterior hip and quad. Increasing functional activity level with Lt UE. Excellent response to treatment.     Rehab Potential  Good    PT Frequency  2x / week    PT Treatment/Interventions  Patient/family education;ADLs/Self Care Home Management;Cryotherapy;Electrical Stimulation;Iontophoresis 4mg /ml Dexamethasone;Moist Heat;Ultrasound;Dry needling;Manual techniques;Neuromuscular re-education;Therapeutic activities;Therapeutic exercise    PT Next Visit Plan   progress with stretching and stabilization of Rt hip/LE's/core. manual work hip abductors. Modalities as indicated.     Consulted and Agree with Plan of Care  Patient       Patient will benefit from skilled therapeutic intervention in order to improve the following deficits and impairments:  Postural dysfunction, Improper body mechanics, Pain, Increased fascial restricitons,  Increased muscle spasms, Decreased mobility, Decreased range of motion,  Decreased strength, Decreased activity tolerance  Visit Diagnosis: Pain in right hip  Other symptoms and signs involving the musculoskeletal system  Muscle weakness (generalized)     Problem List Patient Active Problem List   Diagnosis Date Noted  . Fall 02/16/2018  . Primary osteoarthritis of both knees 11/21/2017  . Greater trochanteric bursitis of right hip 10/23/2017  . Pancreatic tumor 05/08/2017  . Hoarseness 02/10/2017  . Right shoulder injury 08/18/2016  . TIA (transient ischemic attack) 03/08/2016  . Right hand pain 03/07/2016  . Patient has active power of attorney for health care 11/30/2015  . Essential hypertension 12/25/2014  . Murmur, cardiac 09/11/2012  . DJD (degenerative joint disease) 09/03/2012  . S/P hysterectomy 09/03/2012  . Osteoporosis  L fem neck  Dexa 07/2014  -3.1 09/03/2012  . Migraine 09/03/2012  . History of anemia 09/03/2012  . Hyperlipidemia 09/03/2012  . GERD (gastroesophageal reflux disease) 09/03/2012    Magdiel Bartles Nilda Simmer PT, MPH  07/24/2018, 8:51 AM  [facility] [place], [place], [address] Phone: [phone]   Fax:  [phone]  Name: Mercedes Decker MRN: [id number] Date of Birth: 02-27-1941

## 2018-07-24 NOTE — Patient Instructions (Addendum)
Strengthening: Hip Abduction - Resisted    Facing the wall or counter - no band, extend leg out from side. Lead with heel Repeat __10__ times per set. Do __2-3__ sets per session. Do _1___ sessions per day.   Strengthening: Hip Extension - Resisted    Facing wall or counter, and pull leg straight back. Repeat ___10_ times per set. Do __2-3__ sets per session. Do __1__ sessions per day.    SIT TO STAND: No Device    Sit with feet shoulder-width apart, on floor. Lean chest forward, raise hips up from surface. Straighten hips and knees. Weight bear equally on left and right sides. _10__ reps per set, _1-2__ sets per day, __1x/day  Wrist Extension: Resisted    With right palm down, __1__ pound weight in hand, bend wrist up. Return slowly. Repeat ___10_ times per set. Do __1-2__ sets per session. Do _1-2___ sessions per day.   Wrist Flexion: Resisted    With right palm up, __1__ pound weight in hand, bend wrist up. Return slowly. Repeat _10___ times per set. Do __1-3__ sets per session. Do _1-2___ sessions per day.   Wrist Radial Deviation: Resisted    With right thumb up, __1__ pound weight in hand, bend wrist up. Return slowly. Repeat __10__ times per set. Do _1-3___ sets per session. Do _1-2___ sessions per day.    Forearm Supination / Pronation: Resisted    With _1___ pound object in right hand, slowly turn palm up, then down. Repeat _10___ times per set. Do _1-3___ sets per session. Do ___1-2_ sessions per day.

## 2018-07-25 DIAGNOSIS — D22112 Melanocytic nevi of right lower eyelid, including canthus: Secondary | ICD-10-CM | POA: Diagnosis not present

## 2018-07-25 DIAGNOSIS — H04123 Dry eye syndrome of bilateral lacrimal glands: Secondary | ICD-10-CM | POA: Diagnosis not present

## 2018-07-31 ENCOUNTER — Ambulatory Visit (INDEPENDENT_AMBULATORY_CARE_PROVIDER_SITE_OTHER): Payer: Medicare Other | Admitting: Physical Therapy

## 2018-07-31 DIAGNOSIS — M6281 Muscle weakness (generalized): Secondary | ICD-10-CM

## 2018-07-31 DIAGNOSIS — M25551 Pain in right hip: Secondary | ICD-10-CM

## 2018-07-31 DIAGNOSIS — R29898 Other symptoms and signs involving the musculoskeletal system: Secondary | ICD-10-CM

## 2018-07-31 NOTE — Therapy (Signed)
[place], [place], [address] Phone: [phone]   Fax:  [phone]  Physical Therapy Treatment  Patient Details  Name: Mercedes Decker MRN: [id number] Date of Birth: [birth year] Referring Provider (PT): Dr Lynne Leader    Encounter Date: 07/31/2018  PT End of Session - 07/31/18 0852    Visit Number  6    Number of Visits  12    Date for PT Re-Evaluation  08/21/18    PT Start Time  0848    PT Stop Time  0927    PT Time Calculation (min)  39 min    Activity Tolerance  Patient tolerated treatment well;No increased pain    Behavior During Therapy  WFL for tasks assessed/performed       Past Medical History:  Diagnosis Date  . Arthritis   . Hypertension   . Migraine   . Osteoporosis     Past Surgical History:  Procedure Laterality Date  . ABDOMINAL HYSTERECTOMY    . BACK SURGERY    . BREAST SURGERY    . BUNIONECTOMY    . INCISIONAL HERNIA REPAIR    . KNEE ARTHROSCOPY      There were no vitals filed for this visit.  Subjective Assessment - 07/31/18 0852    Subjective  "Saturday I was walking and my hip grabbed me and I almost lost my balance."  She feels good about the exercises she was given for her hand and wrist.  She'd like to focus on her RLE.     Patient Stated Goals  get rid of the hip pain; get wrist moving again     Currently in Pain?  No/denies    Pain Score  0-No pain         [facility] PT Assessment - 07/31/18 0001      AROM   Left Forearm Pronation  85 Degrees    Left Forearm Supination  72 Degrees    Left Wrist Extension  55 Degrees    Left Wrist Flexion  44 Degrees    Left Wrist Radial Deviation  14 Degrees    Left Wrist Ulnar Deviation  34 Degrees       [facility] Adult PT Treatment/Exercise - 07/31/18 0001      Exercises   Exercises  Knee/Hip      Knee/Hip Exercises: Stretches   Quad Stretch  Right;Left;2 reps;30 seconds    ITB Stretch  Right;2 reps;30 seconds    Piriformis Stretch  Right;Left;2 reps;30 seconds   seated, pulling knee towards opp shoulder     Knee/Hip Exercises: Aerobic   Nustep  L4: 6 min (legs only)   PTA present to discuss progress and monitor     Knee/Hip Exercises: Standing   Hip Abduction  AROM;Stengthening;Right;Left;Knee straight;10 reps    Hip Extension  AROM;Stengthening;Right;Left;Knee straight;10 reps    SLS  Rt/Lt x 2 reps up to 20 sec; then 10 sec with horiz head turns     Other Standing Knee Exercises  reviewed all HEP exercise sheets, including Dr. Clovis Riley and discussed minor modifications.       Knee/Hip Exercises: Supine   Bridges  1 set;10 reps   5 sec hold in ext     Knee/Hip Exercises: Sidelying   Hip ABduction  Strengthening;Right;Left;1 set;10 reps   leading with heel     Knee/Hip Exercises: Prone   Hip Extension  Strengthening;Right;Left;1 set;5 reps    Hip Extension Limitations  increased LBP, stopped.  Wrist Exercises   Other wrist exercises  Lt finger ext with rubber band around all fingers x 5 reps, pt instructed to do this in painfree range; verbalized understanding.       Modalities   Modalities  --   pt declined         PT Long Term Goals - 07/31/18 0919      PT LONG TERM GOAL #1   Title  Decrease pain in the LB and Rt hip by 50-75% allowing patient to participate in all normal and functional activities 08/24/18    Time  6    Period  Weeks    Status  On-going   40-50% decrease: 07/31/18     PT LONG TERM GOAL #2   Title  Increase ROM in lumbar spine and bilat hips to [facility] 08/24/18    Time  6    Period  Weeks    Status  On-going      PT LONG TERM GOAL #3   Title  Increase AROM Lt forearm and wrist to allow patient to use Lt UE for all functional activities without limit 08/24/18    Time  6    Period  Weeks    Status  Partially Met      PT LONG TERM GOAL #4   Title  Independent in HEP 08/24/18    Time  6    Period  Weeks    Status  On-going      PT LONG TERM GOAL  #5   Title  Improve FOTO to </= 35% limitation 08/24/18    Time  6    Period  Weeks    Status  On-going            Plan - 07/31/18 0912    Clinical Impression Statement  Pt demonstrated improved Lt wrist ROM.  Trialed different positions for LE strengthening and discussed modifications to HEP. Pt tolerated all exercises well, except prone hip ext which caused LBP.  Pt declined MHP at end of session as she was pain free.  Pt is progressing well towards goals.     Rehab Potential  Good    PT Frequency  2x / week    PT Duration  6 weeks    PT Treatment/Interventions  Patient/family education;ADLs/Self Care Home Management;Cryotherapy;Electrical Stimulation;Iontophoresis 45m/ml Dexamethasone;Moist Heat;Ultrasound;Dry needling;Manual techniques;Neuromuscular re-education;Therapeutic activities;Therapeutic exercise    PT Next Visit Plan   progress with stretching and stabilization of Rt hip/LE's/core. manual work hip abductors. Modalities as indicated.     Consulted and Agree with Plan of Care  Patient       Patient will benefit from skilled therapeutic intervention in order to improve the following deficits and impairments:  Postural dysfunction, Improper body mechanics, Pain, Increased fascial restricitons, Increased muscle spasms, Decreased mobility, Decreased range of motion, Decreased strength, Decreased activity tolerance  Visit Diagnosis: Pain in right hip  Other symptoms and signs involving the musculoskeletal system  Muscle weakness (generalized)     Problem List Patient Active Problem List   Diagnosis Date Noted  . Fall 02/16/2018  . Primary osteoarthritis of both knees 11/21/2017  . Greater trochanteric bursitis of right hip 10/23/2017  . Pancreatic tumor 05/08/2017  . Hoarseness 02/10/2017  . Right shoulder injury 08/18/2016  . TIA (transient ischemic attack) 03/08/2016  . Right hand pain 03/07/2016  . Patient has active power of attorney for health care  11/30/2015  . Essential hypertension 12/25/2014  . Murmur, cardiac 09/11/2012  . DJD (  degenerative joint disease) 09/03/2012  . S/P hysterectomy 09/03/2012  . Osteoporosis  L fem neck  Dexa 07/2014  -3.1 09/03/2012  . Migraine 09/03/2012  . History of anemia 09/03/2012  . Hyperlipidemia 09/03/2012  . GERD (gastroesophageal reflux disease) 09/03/2012   Kerin Perna, PTA 07/31/18 9:29 AM  [place], [place], [address] Phone: [phone]   Fax:  [phone]  Name: Mercedes Decker MRN: [id number] Date of Birth: Oct 11, 1940

## 2018-08-03 ENCOUNTER — Ambulatory Visit (INDEPENDENT_AMBULATORY_CARE_PROVIDER_SITE_OTHER): Payer: Medicare Other | Admitting: Physical Therapy

## 2018-08-03 DIAGNOSIS — M6281 Muscle weakness (generalized): Secondary | ICD-10-CM

## 2018-08-03 DIAGNOSIS — M25551 Pain in right hip: Secondary | ICD-10-CM | POA: Diagnosis not present

## 2018-08-03 DIAGNOSIS — R29898 Other symptoms and signs involving the musculoskeletal system: Secondary | ICD-10-CM

## 2018-08-03 NOTE — Therapy (Signed)
[place], [place], [address] Phone: [phone]   Fax:  [phone]  Physical Therapy Treatment  Patient Details  Name: Mercedes Decker MRN: [id number] Date of Birth: 10-01-1940 Referring Provider (PT): Dr Lynne Leader    Encounter Date: 08/03/2018  PT End of Session - 08/03/18 0806    Visit Number  7    Number of Visits  12    Date for PT Re-Evaluation  08/21/18    PT Start Time  0804    PT Stop Time  6333   MHP last 12 min    PT Time Calculation (min)  53 min    Activity Tolerance  Patient tolerated treatment well;No increased pain    Behavior During Therapy  WFL for tasks assessed/performed       Past Medical History:  Diagnosis Date  . Arthritis   . Hypertension   . Migraine   . Osteoporosis     Past Surgical History:  Procedure Laterality Date  . ABDOMINAL HYSTERECTOMY    . BACK SURGERY    . BREAST SURGERY    . BUNIONECTOMY    . INCISIONAL HERNIA REPAIR    . KNEE ARTHROSCOPY      There were no vitals filed for this visit.  Subjective Assessment - 08/03/18 0807    Subjective  "I've had no pain in my hip for last 3 days.  I've walked the dog 1 mile the last 3 days without a problem, and then walked one more mile without him."  Her hand is tender when she does exercise, but ice reduces discomfort.     Patient Stated Goals  get rid of the hip pain; get wrist moving again     Currently in Pain?  No/denies    Pain Score  0-No pain         [facility] PT Assessment - 08/03/18 0001      Assessment   Medical Diagnosis  Rt trochanteric bursitis; Lt wrist fx     Referring Provider (PT)  Dr Lynne Leader     Onset Date/Surgical Date  06/28/18   Lt wrist fx 03/21/18   Hand Dominance  Right    Next MD Visit  08/14/18    Prior Therapy  none       ROM / Strength   AROM / PROM / Strength  Strength      Strength   Strength Assessment Site  Hand    Right/Left hand  Right;Left    Right  Hand Grip (lbs)  41    Left Hand Grip (lbs)  25       [facility] Adult PT Treatment/Exercise - 08/03/18 0001      Exercises   Exercises  Knee/Hip;Hand      Knee/Hip Exercises: Stretches   Passive Hamstring Stretch  Right;Left    Quad Stretch  Right;Left;2 reps;30 seconds    Piriformis Stretch  Right;Left;30 seconds;5 reps   seated, pulling knee towards opp shoulder, and supine     Knee/Hip Exercises: Aerobic   Nustep  L4: 5 min (legs/arms)-PTA present to discuss progress and monitor.       Knee/Hip Exercises: Supine   Bridges  1 set;10 reps   5 sec hold in ext   Other Supine Knee/Hip Exercises  single leg bridge x 5 reps each leg      Knee/Hip Exercises: Sidelying   Clams  2 sets of 10 of clams and reverse clams for RLE  Hand Exercises   Other Hand Exercises  Handmaster- soft, Lt hand grip and finger ext x 20 reps.     Other Hand Exercises  Velcro board: key grip, Lt hand pronation/ supination x 1x up and down board.       Wrist Exercises   Other wrist exercises  --      Moist Heat Therapy   Number Minutes Moist Heat  12 Minutes    Moist Heat Location  Wrist;Lumbar Spine;Hip             PT Education - 08/03/18 0829    Education Details  HEP - added bridges and clams    Person(s) Educated  Patient    Methods  Explanation;Handout;Verbal cues    Comprehension  Verbalized understanding;Returned demonstration          PT Long Term Goals - 07/31/18 0919      PT LONG TERM GOAL #1   Title  Decrease pain in the LB and Rt hip by 50-75% allowing patient to participate in all normal and functional activities 08/24/18    Time  6    Period  Weeks    Status  On-going   40-50% decrease: 07/31/18     PT LONG TERM GOAL #2   Title  Increase ROM in lumbar spine and bilat hips to [facility] 08/24/18    Time  6    Period  Weeks    Status  On-going      PT LONG TERM GOAL #3   Title  Increase AROM Lt forearm and wrist to allow patient to use Lt UE for all functional  activities without limit 08/24/18    Time  6    Period  Weeks    Status  Partially Met      PT LONG TERM GOAL #4   Title  Independent in HEP 08/24/18    Time  6    Period  Weeks    Status  On-going      PT LONG TERM GOAL #5   Title  Improve FOTO to </= 35% limitation 08/24/18    Time  6    Period  Weeks    Status  On-going            Plan - 08/03/18 0955    Clinical Impression Statement  Pt's Lt grip strength 16# weaker than Rt hand.  She tolerated all exercises for hip and hand well, just reporting fatigue afterwards.  Pt progressing towards goals with reported reduction of pain with functional activities.  Pt will benefit from continued PT intervention to maximize mobility and safety.      Rehab Potential  Good    PT Frequency  2x / week    PT Duration  6 weeks    PT Treatment/Interventions  Patient/family education;ADLs/Self Care Home Management;Cryotherapy;Electrical Stimulation;Iontophoresis 18m/ml Dexamethasone;Moist Heat;Ultrasound;Dry needling;Manual techniques;Neuromuscular re-education;Therapeutic activities;Therapeutic exercise    PT Next Visit Plan   progress with stretching and stabilization of Rt hip/LE's/core. Lt wrist/ hand strengthening as needed.  Modalities as indicated.     Consulted and Agree with Plan of Care  Patient       Patient will benefit from skilled therapeutic intervention in order to improve the following deficits and impairments:  Postural dysfunction, Improper body mechanics, Pain, Increased fascial restricitons, Increased muscle spasms, Decreased mobility, Decreased range of motion, Decreased strength, Decreased activity tolerance  Visit Diagnosis: Pain in right hip  Other symptoms and signs involving the musculoskeletal system  Muscle weakness (  generalized)     Problem List Patient Active Problem List   Diagnosis Date Noted  . Fall 02/16/2018  . Primary osteoarthritis of both knees 11/21/2017  . Greater trochanteric bursitis of  right hip 10/23/2017  . Pancreatic tumor 05/08/2017  . Hoarseness 02/10/2017  . Right shoulder injury 08/18/2016  . TIA (transient ischemic attack) 03/08/2016  . Right hand pain 03/07/2016  . Patient has active power of attorney for health care 11/30/2015  . Essential hypertension 12/25/2014  . Murmur, cardiac 09/11/2012  . DJD (degenerative joint disease) 09/03/2012  . S/P hysterectomy 09/03/2012  . Osteoporosis  L fem neck  Dexa 07/2014  -3.1 09/03/2012  . Migraine 09/03/2012  . History of anemia 09/03/2012  . Hyperlipidemia 09/03/2012  . GERD (gastroesophageal reflux disease) 09/03/2012   Kerin Perna, PTA 08/03/18 10:04 AM  [place], [place], [address] Phone: [phone]   Fax:  [phone]  Name: Mercedes Decker MRN: [id number] Date of Birth: 16-May-1941

## 2018-08-03 NOTE — Patient Instructions (Addendum)
Therapeutic - Bridging    Lift buttocks, keeping back straight and arms on floor. Hold _5___ seconds. Repeat __10__ times. If too easy, can do single leg bridge (cross ankle at opposite thigh).  Stretch back of leg afterwards.    Clam    Lie on side, legs bent 90. Open top knee to ceiling, rotating leg outward - 10 x.    Close knees, rotating leg inward, foot towards ceiling - 10 x. 2 sets.    Stretching EVERY day,   Strengthening EVERY OTHER day.

## 2018-08-06 ENCOUNTER — Encounter: Payer: Medicare Other | Admitting: Physical Therapy

## 2018-08-08 ENCOUNTER — Ambulatory Visit (INDEPENDENT_AMBULATORY_CARE_PROVIDER_SITE_OTHER): Payer: Medicare Other | Admitting: Physical Therapy

## 2018-08-08 DIAGNOSIS — M6281 Muscle weakness (generalized): Secondary | ICD-10-CM | POA: Diagnosis not present

## 2018-08-08 DIAGNOSIS — M25551 Pain in right hip: Secondary | ICD-10-CM

## 2018-08-08 DIAGNOSIS — R29898 Other symptoms and signs involving the musculoskeletal system: Secondary | ICD-10-CM | POA: Diagnosis not present

## 2018-08-08 NOTE — Therapy (Signed)
[place], [place], [address] Phone: [phone]   Fax:  [phone]  Physical Therapy Treatment  Patient Details  Name: Mercedes Decker MRN: [id number] Date of Birth: 11-Apr-1941 Referring Provider (PT): Dr Lynne Leader    Encounter Date: 08/08/2018  PT End of Session - 08/08/18 0855    Visit Number  8    Number of Visits  12    Date for PT Re-Evaluation  08/21/18    PT Start Time  0803    PT Stop Time  0906    PT Time Calculation (min)  63 min    Activity Tolerance  Patient tolerated treatment well    Behavior During Therapy  [facility] for tasks assessed/performed       Past Medical History:  Diagnosis Date  . Arthritis   . Hypertension   . Migraine   . Osteoporosis     Past Surgical History:  Procedure Laterality Date  . ABDOMINAL HYSTERECTOMY    . BACK SURGERY    . BREAST SURGERY    . BUNIONECTOMY    . INCISIONAL HERNIA REPAIR    . KNEE ARTHROSCOPY      There were no vitals filed for this visit.  Subjective Assessment - 08/08/18 0803    Subjective  Pt reports she did well after last session.  She had some pain in her Rt hip after a lot of walking on Saturday.  She has had this grabbing feeling in the back of her hip that won't resolve.     Patient Stated Goals  get rid of the hip pain; get wrist moving again     Currently in Pain?  No/denies    Pain Score  0-No pain         [facility] PT Assessment - 08/08/18 0001      Assessment   Medical Diagnosis  Rt trochanteric bursitis; Lt wrist fx     Referring Provider (PT)  Dr Lynne Leader     Onset Date/Surgical Date  06/28/18   Lt wrist fx 03/21/18   Hand Dominance  Right    Next MD Visit  08/14/18    Prior Therapy  none        [facility] Adult PT Treatment/Exercise - 08/08/18 0001      Knee/Hip Exercises: Stretches   Passive Hamstring Stretch  Right;Left;2 reps;30 seconds    Quad Stretch  Right;Left;2 reps;30 seconds    Piriformis  Stretch  Right;Left;30 seconds;5 reps   supine, pulling knee towards opp shoulder, and supine     Knee/Hip Exercises: Aerobic   Nustep  L4: 5 min (legs/arms)-PTA present to discuss progress and monitor.       Knee/Hip Exercises: Supine   Other Supine Knee/Hip Exercises  single leg bridge x 5 reps each leg      Knee/Hip Exercises: Sidelying   Clams  Rt clam x 10 reps, 2 sets with yellow band      Moist Heat Therapy   Number Minutes Moist Heat  20 Minutes    Moist Heat Location  Hip   Rt     Electrical Stimulation   Electrical Stimulation Location  Rt posterior hip     Electrical Stimulation Action  IFC    Electrical Stimulation Parameters  to tolerance    Electrical Stimulation Goals  Pain;Tone      Manual Therapy   Manual therapy comments  pt prone and sidelying; skilled palpation and monitoring of soft tissue during  DN    Soft tissue mobilization  TPR to Rt piriformis with passive and active ER/IR of Rt hip (pt very guarded with PROM attempts).  Pin and stretch to Rt glute/hamstring; STM to Rt glute.        Trigger Point Dry Needling - 08/08/18 1213    Consent Given?  Yes    Education Handout Provided  No   will provide next session, verbally reviewed with pt.   Muscles Treated Lower Body  Gluteus maximus;Gluteus minimus;Piriformis    Gluteus Maximus Response  Twitch response elicited;Palpable increased muscle length    Gluteus Minimus Response  Twitch response elicited;Palpable increased muscle length    Piriformis Response  Twitch response elicited;Palpable increased muscle length                PT Long Term Goals - 07/31/18 0919      PT LONG TERM GOAL #1   Title  Decrease pain in the LB and Rt hip by 50-75% allowing patient to participate in all normal and functional activities 08/24/18    Time  6    Period  Weeks    Status  On-going   40-50% decrease: 07/31/18     PT LONG TERM GOAL #2   Title  Increase ROM in lumbar spine and bilat hips to [facility] 08/24/18     Time  6    Period  Weeks    Status  On-going      PT LONG TERM GOAL #3   Title  Increase AROM Lt forearm and wrist to allow patient to use Lt UE for all functional activities without limit 08/24/18    Time  6    Period  Weeks    Status  Partially Met      PT LONG TERM GOAL #4   Title  Independent in HEP 08/24/18    Time  6    Period  Weeks    Status  On-going      PT LONG TERM GOAL #5   Title  Improve FOTO to </= 35% limitation 08/24/18    Time  6    Period  Weeks    Status  On-going            Plan - 08/08/18 0841    Clinical Impression Statement  Pt tolerated exercises well, reporting increased fatigue in Rt hip with added resistance to clam exercise. She was guarded with manual therapy; tender with palpation and STM to Rt glute max insertion, Rt piriformis (both attachments and midbelly of muscle). Palpable release of tightness with manual/DN.  Pt making good gains each visit.     Rehab Potential  Good    PT Frequency  2x / week    PT Duration  6 weeks    PT Treatment/Interventions  Patient/family education;ADLs/Self Care Home Management;Cryotherapy;Electrical Stimulation;Iontophoresis 82m/ml Dexamethasone;Moist Heat;Ultrasound;Dry needling;Manual techniques;Neuromuscular re-education;Therapeutic activities;Therapeutic exercise    PT Next Visit Plan   assess response to manual therapy/DN. progress with stretching and stabilization of Rt hip/LE's/core. Lt wrist/ hand strengthening as needed.  Modalities as indicated.     Consulted and Agree with Plan of Care  Patient       Patient will benefit from skilled therapeutic intervention in order to improve the following deficits and impairments:  Postural dysfunction, Improper body mechanics, Pain, Increased fascial restricitons, Increased muscle spasms, Decreased mobility, Decreased range of motion, Decreased strength, Decreased activity tolerance  Visit Diagnosis: Pain in right hip  Other symptoms and signs involving  the musculoskeletal system  Muscle weakness (generalized)     Problem List Patient Active Problem List   Diagnosis Date Noted  . Fall 02/16/2018  . Primary osteoarthritis of both knees 11/21/2017  . Greater trochanteric bursitis of right hip 10/23/2017  . Pancreatic tumor 05/08/2017  . Hoarseness 02/10/2017  . Right shoulder injury 08/18/2016  . TIA (transient ischemic attack) 03/08/2016  . Right hand pain 03/07/2016  . Patient has active power of attorney for health care 11/30/2015  . Essential hypertension 12/25/2014  . Murmur, cardiac 09/11/2012  . DJD (degenerative joint disease) 09/03/2012  . S/P hysterectomy 09/03/2012  . Osteoporosis  L fem neck  Dexa 07/2014  -3.1 09/03/2012  . Migraine 09/03/2012  . History of anemia 09/03/2012  . Hyperlipidemia 09/03/2012  . GERD (gastroesophageal reflux disease) 09/03/2012   Kerin Perna, PTA 08/08/18 12:15 PM   Laureen Abrahams, PT, DPT 08/08/18 12:15 PM     [facility] [place], [place], [address] Phone: [phone]   Fax:  [phone]  Name: Mercedes Decker MRN: [id number] Date of Birth: September 29, 1940

## 2018-08-08 NOTE — Patient Instructions (Signed)

## 2018-08-10 ENCOUNTER — Ambulatory Visit (INDEPENDENT_AMBULATORY_CARE_PROVIDER_SITE_OTHER): Payer: Medicare Other | Admitting: Physical Therapy

## 2018-08-10 DIAGNOSIS — M6281 Muscle weakness (generalized): Secondary | ICD-10-CM | POA: Diagnosis not present

## 2018-08-10 DIAGNOSIS — M25551 Pain in right hip: Secondary | ICD-10-CM

## 2018-08-10 DIAGNOSIS — R29898 Other symptoms and signs involving the musculoskeletal system: Secondary | ICD-10-CM

## 2018-08-10 NOTE — Therapy (Signed)
[place], [place], [address] Phone: [phone]   Fax:  [phone]  Physical Therapy Treatment  Patient Details  Name: Mercedes Decker MRN: [id number] Date of Birth: Jan 20, 1941 Referring Provider (PT): Dr Lynne Leader    Encounter Date: 08/10/2018  PT End of Session - 08/10/18 0809    Visit Number  9    Number of Visits  12    Date for PT Re-Evaluation  08/21/18    PT Start Time  0804    PT Stop Time  0900    PT Time Calculation (min)  56 min       Past Medical History:  Diagnosis Date  . Arthritis   . Hypertension   . Migraine   . Osteoporosis     Past Surgical History:  Procedure Laterality Date  . ABDOMINAL HYSTERECTOMY    . BACK SURGERY    . BREAST SURGERY    . BUNIONECTOMY    . INCISIONAL HERNIA REPAIR    . KNEE ARTHROSCOPY      There were no vitals filed for this visit.  Subjective Assessment - 08/10/18 0809    Subjective  Mercedes Decker reports she has had no pain in her Rt hip since last session (with DN).  She feels it is much improved since last visit. Her wrist continues to have some soreness with movement.     Patient Stated Goals  get rid of the hip pain; get wrist moving again     Currently in Pain?  No/denies    Pain Score  0-No pain         [facility] PT Assessment - 08/10/18 0001      Assessment   Medical Diagnosis  Rt trochanteric bursitis; Lt wrist fx     Referring Provider (PT)  Dr Lynne Leader     Onset Date/Surgical Date  06/28/18   Lt wrist fx 03/21/18   Hand Dominance  Right    Next MD Visit  08/14/18    Prior Therapy  none       AROM   Overall AROM Comments  Lumbar/Rt hip ROM WNL and pain free.     Left Forearm Supination  90 Degrees    Left Wrist Extension  61 Degrees    Left Wrist Flexion  56 Degrees    Left Wrist Radial Deviation  18 Degrees    Left Wrist Ulnar Deviation  46 Degrees      Strength   Right Hand Grip (lbs)  41    Left Hand Grip (lbs)  26     Right Hip Flexion  4+/5    Right Hip Extension  --   5-/5   Right Hip ABduction  4+/5        [facility] Adult PT Treatment/Exercise - 08/10/18 0001      Exercises   Exercises  Wrist;Knee/Hip      Knee/Hip Exercises: Stretches   Piriformis Stretch  Right;Left;30 seconds;5 reps   seated, pulling knee towards opp shoulder, and supine     Knee/Hip Exercises: Aerobic   Nustep  L4: 5 min (legs/arms)-PTA present to discuss progress and monitor.       Knee/Hip Exercises: Supine   Straight Leg Raises  Strengthening;Right;1 set;10 reps   slow and controlled motion     Knee/Hip Exercises: Sidelying   Hip ABduction  Strengthening;Right;1 set;10 reps   slow and controlled motion     Hand Exercises   Other Hand Exercises  Handmaster- soft, Lt hand grip and finger ext x 20 reps.     Other Hand Exercises  Velcro board: roller brush (Lt wrist flex/ext) x 2 reps up/back on board; key grip, (Lt hand pronation/ supination) x 2 reps up/down board; jar grip (pronation/supination) x 2 reps       Wrist Exercises   Other wrist exercises  Lt wrist flex and ext stretches x 20 sec x 3 reps each direction.        Moist Heat Therapy   Number Minutes Moist Heat  15 Minutes    Moist Heat Location  Wrist;Hip      Electrical Stimulation   Electrical Stimulation Location  Lt wrist     Electrical Stimulation Action  IFC    Electrical Stimulation Parameters  to pt tolerance    Electrical Stimulation Goals  Pain        PT Long Term Goals - 08/10/18 0815      PT LONG TERM GOAL #1   Title  Decrease pain in the LB and Rt hip by 50-75% allowing patient to participate in all normal and functional activities 08/24/18    Time  6    Period  Weeks    Status  Achieved      PT LONG TERM GOAL #2   Title  Increase ROM in lumbar spine and bilat hips to [facility] 08/24/18    Time  6    Period  Weeks    Status  Achieved   however, Rt hip has limited strength     PT LONG TERM GOAL #3   Title  Increase AROM Lt  forearm and wrist to allow patient to use Lt UE for all functional activities without limit 08/24/18    Time  6    Period  Weeks    Status  Partially Met      PT LONG TERM GOAL #4   Title  Independent in HEP 08/24/18    Time  6    Period  Weeks    Status  On-going      PT LONG TERM GOAL #5   Title  Improve FOTO to </= 35% limitation 08/24/18    Time  6    Period  Weeks    Status  On-going            Plan - 08/10/18 0824    Clinical Impression Statement  Much improved lumbar, Rt hip, and Lt wrist ROM.  She reports 90% improvement in pain in Rt hip since initiating therapy. Pt continues to have some weakness in Lt hand, wrist, and forearm. Pt has met LTG#1 and 2; is making good gains for remaining goals. Pt will benefit from continued PT intervention to maximize functional mobility and safety.     Rehab Potential  Good    PT Frequency  2x / week    PT Duration  6 weeks    PT Treatment/Interventions  Patient/family education;ADLs/Self Care Home Management;Cryotherapy;Electrical Stimulation;Iontophoresis 15m/ml Dexamethasone;Moist Heat;Ultrasound;Dry needling;Manual techniques;Neuromuscular re-education;Therapeutic activities;Therapeutic exercise    PT Next Visit Plan  10th visit; FOTO; continue strengthening for Rt hip and Lt wrist/hand .    Consulted and Agree with Plan of Care  Patient       Patient will benefit from skilled therapeutic intervention in order to improve the following deficits and impairments:  Postural dysfunction, Improper body mechanics, Pain, Increased fascial restricitons, Increased muscle spasms, Decreased mobility, Decreased range of motion, Decreased strength, Decreased activity tolerance  Visit Diagnosis:  Pain in right hip  Other symptoms and signs involving the musculoskeletal system  Muscle weakness (generalized)     Problem List Patient Active Problem List   Diagnosis Date Noted  . Fall 02/16/2018  . Primary osteoarthritis of both knees  11/21/2017  . Greater trochanteric bursitis of right hip 10/23/2017  . Pancreatic tumor 05/08/2017  . Hoarseness 02/10/2017  . Right shoulder injury 08/18/2016  . TIA (transient ischemic attack) 03/08/2016  . Right hand pain 03/07/2016  . Patient has active power of attorney for health care 11/30/2015  . Essential hypertension 12/25/2014  . Murmur, cardiac 09/11/2012  . DJD (degenerative joint disease) 09/03/2012  . S/P hysterectomy 09/03/2012  . Osteoporosis  L fem neck  Dexa 07/2014  -3.1 09/03/2012  . Migraine 09/03/2012  . History of anemia 09/03/2012  . Hyperlipidemia 09/03/2012  . GERD (gastroesophageal reflux disease) 09/03/2012   Kerin Perna, PTA 08/10/18 10:39 AM  [place], [place], [address] Phone: [phone]   Fax:  [phone]  Name: Mercedes Decker MRN: [id number] Date of Birth: 02/21/1941

## 2018-08-14 ENCOUNTER — Encounter: Payer: Self-pay | Admitting: Family Medicine

## 2018-08-14 ENCOUNTER — Ambulatory Visit (INDEPENDENT_AMBULATORY_CARE_PROVIDER_SITE_OTHER): Payer: Medicare Other | Admitting: Family Medicine

## 2018-08-14 VITALS — BP 142/62 | HR 72 | Ht <= 58 in | Wt 108.0 lb

## 2018-08-14 DIAGNOSIS — H029 Unspecified disorder of eyelid: Secondary | ICD-10-CM

## 2018-08-14 DIAGNOSIS — I10 Essential (primary) hypertension: Secondary | ICD-10-CM | POA: Diagnosis not present

## 2018-08-14 DIAGNOSIS — M7061 Trochanteric bursitis, right hip: Secondary | ICD-10-CM

## 2018-08-14 DIAGNOSIS — S52532A Colles' fracture of left radius, initial encounter for closed fracture: Secondary | ICD-10-CM | POA: Diagnosis not present

## 2018-08-14 NOTE — Patient Instructions (Signed)
Thank you for coming in today. Get labs now and schedule nurse visit for Prolia  You should hear about eye doctor.  Recheck with me as needed.  Follow up with Dr Sheppard Coil for medical problems in the future as needed.

## 2018-08-14 NOTE — Progress Notes (Signed)
Mercedes Decker is a 77 y.o. female who presents to [facility] today for follow-up of left wrist fracture, right trochanteric bursitis, and eyelid lesion as well as labs in preparation for Prolia.  Mercedes Decker is been seen multiple times this year for a left wrist fracture.  She notes that she has been feeling great with only little bit of stiffness is no pain since her last visit in November.  She is happy with how things are going.  Additionally she had trochanteric bursitis seen in her last visit back in early November.  She has been doing physical therapy and notes near complete resolution of symptoms.  She is happy that things are going to continue to exercise.  Additionally she notes a right lower eyelid lesion is been ongoing now for a few weeks.  She notes it is worsening and becoming crusted.  She has been trying topical antibiotic ointment and warm compress which has not helped.  She is concerned that it may be an early skin cancer.  Additionally she typically gets Prolia injections but had delay when she suffered a wrist fracture in July.  She asks if it is okay for her to get her Prolia injection now.   ROS as above:  Exam:  BP (!) 142/62   Pulse 72   Ht 4\' 10"  (1.473 m)   Wt 108 lb (49 kg)   BMI 22.57 kg/m  Wt Readings from Last 5 Encounters:  08/14/18 108 lb (49 kg)  07/04/18 108 lb (49 kg)  06/14/18 104 lb (47.2 kg)  05/24/18 106 lb (48.1 kg)  05/10/18 106 lb (48.1 kg)    Gen: Well NAD HEENT: EOMI,  MMM right lateral eyelid small several millimeter erythematous crusted lesion with central umbilication.  No conjunctival injection. Lungs: Normal work of breathing. CTABL Heart: RRR no MRG Abd: NABS, Soft. Nondistended, Nontender Exts: Brisk capillary refill, warm and well perfused.  Left wrist normal-appearing normal motion nontender. Right hip  normal motion normal gait.     Assessment and Plan: 77 y.o. female with  Right trochanteric bursitis.  Significant improvement with physical therapy.  Finish out course of physical therapy and proceed with continued home exercise program.  Recheck with me as needed.  Left wrist fracture: Significant healing on last x-ray.  Patient clinically is doing extremely well.  Proceed with watchful waiting with continued home exercise program  Right lower eyelid lesion unclear but concerning for possible basal cell carcinoma or squamous cell carcinoma.  I believe this would benefit from biopsy.  Refer to ophthalmology given the delicate location of her lesion.  Labs in preparation of Prolia.  Plan to check metabolic panel and CBC.  This will also help follow-up her hypertension labs as well.  Assuming calcium is normal proceed with Prolia injection.   Orders Placed This Encounter  Procedures  . COMPLETE METABOLIC PANEL WITH GFR  . CBC  . Ambulatory referral to Ophthalmology    Referral Priority:   Routine    Referral Type:   Consultation    Referral Reason:   Specialty Services Required    Requested Specialty:   Ophthalmology    Number of Visits Requested:   1   No orders of the defined types were placed in this encounter.    Historical information moved to improve visibility of documentation.  Past Medical History:  Diagnosis Date  . Arthritis   . Hypertension   . Migraine   .  Osteoporosis    Past Surgical History:  Procedure Laterality Date  . ABDOMINAL HYSTERECTOMY    . BACK SURGERY    . BREAST SURGERY    . BUNIONECTOMY    . INCISIONAL HERNIA REPAIR    . KNEE ARTHROSCOPY     Social History   Tobacco Use  . Smoking status: Never Smoker  . Smokeless tobacco: Never Used  Substance Use Topics  . Alcohol use: No   family history includes Alzheimer's disease in her mother; Cancer in her brother and sister; Diabetes in her brother and sister; Heart disease in her brother and  father.  Medications: Current Outpatient Medications  Medication Sig Dispense Refill  . amLODipine (NORVASC) 5 MG tablet Take 1 tablet (5 mg total) by mouth daily. Pt needs f/u appt w/PCP for further refills. 90 tablet 3  . aspirin EC 81 MG tablet Take 1 tablet (81 mg total) by mouth daily.    Marland Kitchen atorvastatin (LIPITOR) 20 MG tablet Take 1 tablet (20 mg total) by mouth daily. 90 tablet 1  . CALCIUM-MAGNESIUM-ZINC PO Take by mouth.    . celecoxib (CELEBREX) 200 MG capsule One to 2 tablets by mouth daily as needed for pain. 60 capsule 2  . cholecalciferol (VITAMIN D) 1000 UNITS tablet Take 2,000 Units by mouth daily.    . Cyanocobalamin (VITAMIN B 12 PO) Take by mouth.    . denosumab (PROLIA) 60 MG/ML SOLN injection Inject 60 mg into the skin every 6 (six) months. Administer in upper arm, thigh, or abdomen    . escitalopram (LEXAPRO) 10 MG tablet Take 1 tablet (10 mg total) by mouth daily. 90 tablet 0  . ipratropium (ATROVENT) 0.03 % nasal spray Place 2 sprays into both nostrils 4 (four) times daily as needed for rhinitis. 30 mL 0  . metoprolol succinate (TOPROL-XL) 50 MG 24 hr tablet Take 1 tablet (50 mg total) by mouth daily. Take with or immediately following a meal. 90 tablet 3  . SUMAtriptan 6 MG/0.5ML SOAJ Inject 1 Syringe into the skin once. Inject 6 mg into skin at onset of headache one time 1 Syringe 3  . triamcinolone cream (KENALOG) 0.1 % Apply 1 application topically 2 (two) times daily. 30 g 0  . vitamin C (ASCORBIC ACID) 500 MG tablet Take 500 mg by mouth daily.     No current facility-administered medications for this visit.    Allergies  Allergen Reactions  . Tape     BANDAIDS AND ADHESIVE TAPES TEAR SKIN (VERY THIN SKIN)  REQUESTING HYPOALLERGENIC TAPE.  "OUCHLESS TAPE." IS OK  . Cortisone     Flushing and worsening joint pain Flushing and worsening joint pain  . Meloxicam     Too sleepy Too sleepy  . Ace Inhibitors Cough  . Atorvastatin     Possible allergy  .  Losartan Other (See Comments)    Sweats, cramps  . Vicodin [Hydrocodone-Acetaminophen] Other (See Comments)    Pt states that she stops breathing when she takes  Pain meds     Discussed warning signs or symptoms. Please see discharge instructions. Patient expresses understanding.

## 2018-08-15 ENCOUNTER — Encounter: Payer: Self-pay | Admitting: Physical Therapy

## 2018-08-15 ENCOUNTER — Ambulatory Visit (INDEPENDENT_AMBULATORY_CARE_PROVIDER_SITE_OTHER): Payer: Medicare Other | Admitting: Physical Therapy

## 2018-08-15 DIAGNOSIS — R29898 Other symptoms and signs involving the musculoskeletal system: Secondary | ICD-10-CM | POA: Diagnosis not present

## 2018-08-15 DIAGNOSIS — M25551 Pain in right hip: Secondary | ICD-10-CM | POA: Diagnosis not present

## 2018-08-15 LAB — COMPLETE METABOLIC PANEL WITH GFR
AG RATIO: 1.3 (calc) (ref 1.0–2.5)
ALT: 17 U/L (ref 6–29)
AST: 20 U/L (ref 10–35)
Albumin: 3.7 g/dL (ref 3.6–5.1)
Alkaline phosphatase (APISO): 58 U/L (ref 33–130)
BILIRUBIN TOTAL: 0.3 mg/dL (ref 0.2–1.2)
BUN/Creatinine Ratio: 20 (calc) (ref 6–22)
BUN: 20 mg/dL (ref 7–25)
CALCIUM: 9.7 mg/dL (ref 8.6–10.4)
CHLORIDE: 106 mmol/L (ref 98–110)
CO2: 27 mmol/L (ref 20–32)
Creat: 0.98 mg/dL — ABNORMAL HIGH (ref 0.60–0.93)
GFR, EST AFRICAN AMERICAN: 64 mL/min/{1.73_m2} (ref >=60)
GFR, Est Non African American: 55 mL/min/{1.73_m2} — ABNORMAL LOW (ref >=60)
GLOBULIN: 2.8 g/dL (ref 1.9–3.7)
Glucose, Bld: 109 mg/dL — ABNORMAL HIGH (ref 65–99)
Potassium: 5.3 mmol/L (ref 3.5–5.3)
SODIUM: 141 mmol/L (ref 135–146)
TOTAL PROTEIN: 6.5 g/dL (ref 6.1–8.1)

## 2018-08-15 LAB — CBC
HCT: 36.6 % (ref 35.0–45.0)
Hemoglobin: 12.7 g/dL (ref 11.7–15.5)
MCH: 32.3 pg (ref 27.0–33.0)
MCHC: 34.7 g/dL (ref 32.0–36.0)
MCV: 93.1 fL (ref 80.0–100.0)
MPV: 10.7 fL (ref 7.5–12.5)
Platelets: 332 10*3/uL (ref 140–400)
RBC: 3.93 10*6/uL (ref 3.80–5.10)
RDW: 13 % (ref 11.0–15.0)
WBC: 7 10*3/uL (ref 3.8–10.8)

## 2018-08-15 NOTE — Therapy (Addendum)
[facility] [address] [place], [place], [address] Phone: [phone]   Fax:  [phone]  Physical Therapy Treatment  Patient Details  Name: Mercedes Decker MRN: [id number] Date of Birth: 07-Oct-1940 Referring Provider (PT): Dr Lynne Leader   Progress Note Reporting Period 07/10/18 to 08/15/18  See note below for Objective Data and Assessment of Progress/Goals.       Encounter Date: 08/15/2018  PT End of Session - 08/15/18 0808    Visit Number  10    Number of Visits  12    Date for PT Re-Evaluation  08/21/18    PT Start Time  0800    PT Stop Time  0858    PT Time Calculation (min)  58 min    Activity Tolerance  Patient tolerated treatment well;No increased pain    Behavior During Therapy  WFL for tasks assessed/performed       Past Medical History:  Diagnosis Date  . Arthritis   . Hypertension   . Migraine   . Osteoporosis     Past Surgical History:  Procedure Laterality Date  . ABDOMINAL HYSTERECTOMY    . BACK SURGERY    . BREAST SURGERY    . BUNIONECTOMY    . INCISIONAL HERNIA REPAIR    . KNEE ARTHROSCOPY      There were no vitals filed for this visit.  Subjective Assessment - 08/15/18 0827    Subjective  "I'm feeling great".  Pt reports she has had no pain since last visit.      Patient Stated Goals  get rid of the hip pain; get wrist moving again     Currently in Pain?  No/denies    Pain Score  0-No pain         [facility] PT Assessment - 08/15/18 0001      Assessment   Medical Diagnosis  Rt trochanteric bursitis; Lt wrist fx     Referring Provider (PT)  Dr Lynne Leader     Onset Date/Surgical Date  06/28/18   Lt wrist fx 03/21/18   Hand Dominance  Right    Next MD Visit  08/14/18    Prior Therapy  none       Observation/Other Assessments   Focus on Therapeutic Outcomes (FOTO)   13% limitation      AROM   Left Wrist Extension  61 Degrees    Left Wrist Flexion  60 Degrees         [facility] Adult PT Treatment/Exercise - 08/15/18 0001      Knee/Hip Exercises: Stretches   Passive Hamstring Stretch  Right;Left;2 reps;60 seconds    ITB Stretch  Right;Left;2 reps;30 seconds    Piriformis Stretch  Right;Left;3 reps;30 seconds      Knee/Hip Exercises: Aerobic   Elliptical  L1: 2 min       Knee/Hip Exercises: Sidelying   Hip ABduction  Strengthening;Right;1 set;10 reps;Left   slow and controlled motion   Clams  Rt clam x 10 reps      Hand Exercises   Other Hand Exercises  Handmaster- soft, Lt hand grip and finger ext x 20 reps.  Prohand, yellow, finger flexion (each individually) x 10 reps     Other Hand Exercises  Velcro board: roller brush (Lt wrist flex/ext) x 2 reps up/back on board; small round grip, (Lt hand pronation/ supination) x 2 reps up/down board      Wrist Exercises   Other wrist exercises  Lt wrist  flex and ext stretches x 20 sec x 3 reps each direction.        Moist Heat Therapy   Number Minutes Moist Heat  15 Minutes    Moist Heat Location  Wrist;Hip      Electrical Stimulation   Electrical Stimulation Location  Lt wrist     Electrical Stimulation Action  IFC    Electrical Stimulation Parameters  to tolerance     Electrical Stimulation Goals  Pain      Manual Therapy   Manual Therapy  Soft tissue mobilization    Soft tissue mobilization  STM and IASTM to Lt thenar emminance, wrist flexors and wrist extensors to decrease fascial tightness and improve ROM.       verbally reviewed current HEP; pt verbalized understanding.        PT Education - 08/15/18 0906    Education Details  HEP     Person(s) Educated  Patient    Methods  Explanation;Handout;Demonstration    Comprehension  Verbalized understanding;Returned demonstration          PT Long Term Goals - 08/15/18 0812      PT LONG TERM GOAL #1   Title  Decrease pain in the LB and Rt hip by 50-75% allowing patient to participate in all normal and functional activities 08/24/18     Time  6    Period  Weeks    Status  Achieved      PT LONG TERM GOAL #2   Title  Increase ROM in lumbar spine and bilat hips to [facility] 08/24/18    Time  6    Period  Weeks    Status  Achieved      PT LONG TERM GOAL #3   Title  Increase AROM Lt forearm and wrist to allow patient to use Lt UE for all functional activities without limit 08/24/18    Time  6    Period  Weeks    Status  Partially Met      PT LONG TERM GOAL #4   Title  Independent in HEP 08/24/18    Time  6    Period  Weeks    Status  Achieved      PT LONG TERM GOAL #5   Title  Improve FOTO to </= 35% limitation 08/24/18    Time  6    Period  Weeks    Status  Achieved            Plan - 08/15/18 0817    Clinical Impression Statement  Pt's FOTO score improved to 87% (13% limitation); has met LTG#5.  She tolerated all exercises well today, without increase in symptoms.  She has met all but one goal, but verbalized readiness to d/c today and continue HEP.      Rehab Potential  Good    PT Frequency  2x / week    PT Duration  6 weeks    PT Treatment/Interventions  Patient/family education;ADLs/Self Care Home Management;Cryotherapy;Electrical Stimulation;Iontophoresis 33m/ml Dexamethasone;Moist Heat;Ultrasound;Dry needling;Manual techniques;Neuromuscular re-education;Therapeutic activities;Therapeutic exercise    PT Next Visit Plan  spoke to supervising PT; will d/c per pt request.     Consulted and Agree with Plan of Care  Patient      Patient has made excellent progress with rehab - she feels confident in continuing with independent HEP and will call with any questions or concerns.    Patient will benefit from skilled therapeutic intervention in order to improve the following deficits  and impairments:  Postural dysfunction, Improper body mechanics, Pain, Increased fascial restricitons, Increased muscle spasms, Decreased mobility, Decreased range of motion, Decreased strength, Decreased activity tolerance  Visit  Diagnosis: Pain in right hip  Other symptoms and signs involving the musculoskeletal system     Problem List Patient Active Problem List   Diagnosis Date Noted  . Fall 02/16/2018  . Primary osteoarthritis of both knees 11/21/2017  . Greater trochanteric bursitis of right hip 10/23/2017  . Pancreatic tumor 05/08/2017  . Hoarseness 02/10/2017  . Right shoulder injury 08/18/2016  . TIA (transient ischemic attack) 03/08/2016  . Right hand pain 03/07/2016  . Patient has active power of attorney for health care 11/30/2015  . Essential hypertension 12/25/2014  . Murmur, cardiac 09/11/2012  . DJD (degenerative joint disease) 09/03/2012  . S/P hysterectomy 09/03/2012  . Osteoporosis  L fem neck  Dexa 07/2014  -3.1 09/03/2012  . Migraine 09/03/2012  . History of anemia 09/03/2012  . Hyperlipidemia 09/03/2012  . GERD (gastroesophageal reflux disease) 09/03/2012   Kerin Perna, PTA 08/15/18 9:07 AM  Celyn P. Helene Kelp PT, MPH 08/15/18 10:34 AM    [facility] [place], [place], [address] Phone: [phone]   Fax:  [phone]  Name: Mercedes Decker MRN: [id number] Date of Birth: [birth year]  PHYSICAL THERAPY DISCHARGE SUMMARY  Visits from Start of Care: 10  Current functional level related to goals / functional outcomes: See progress note for discharge status    Remaining deficits: Needs to continue with independent HEP    Education / Equipment: HEP  Plan: Patient agrees to discharge.  Patient goals were partially met. Patient is being discharged due to being pleased with the current functional level.  ?????    Celyn P. Helene Kelp PT, MPH 08/15/18 10:34 AM

## 2018-08-15 NOTE — Patient Instructions (Addendum)
Straight Leg Raise    Tighten stomach and slowly raise locked right leg __10__ inches from floor. Repeat __10__ times per set. Do _1-2___ sets per session. Do _1___ sessions per day.    Piriformis (Supine)    Cross legs, right on top. Gently pull other knee toward chest until stretch is felt in buttock/hip of top leg. Hold __30__ seconds. Repeat __2__ times per set. Do _1___ sets per session. Do __1__ sessions per day.    Marland Kitchen

## 2018-08-20 DIAGNOSIS — Z79899 Other long term (current) drug therapy: Secondary | ICD-10-CM | POA: Diagnosis not present

## 2018-08-20 DIAGNOSIS — Z9081 Acquired absence of spleen: Secondary | ICD-10-CM | POA: Diagnosis not present

## 2018-08-20 DIAGNOSIS — M818 Other osteoporosis without current pathological fracture: Secondary | ICD-10-CM | POA: Diagnosis not present

## 2018-08-23 ENCOUNTER — Encounter: Payer: Medicare Other | Admitting: Rehabilitative and Restorative Service Providers"

## 2018-08-24 ENCOUNTER — Encounter: Payer: Self-pay | Admitting: Osteopathic Medicine

## 2018-08-24 ENCOUNTER — Ambulatory Visit (INDEPENDENT_AMBULATORY_CARE_PROVIDER_SITE_OTHER): Payer: Medicare Other | Admitting: Osteopathic Medicine

## 2018-08-24 ENCOUNTER — Ambulatory Visit (INDEPENDENT_AMBULATORY_CARE_PROVIDER_SITE_OTHER): Payer: Medicare Other

## 2018-08-24 VITALS — BP 143/60 | HR 67 | Temp 97.9°F | Wt 108.7 lb

## 2018-08-24 DIAGNOSIS — F411 Generalized anxiety disorder: Secondary | ICD-10-CM

## 2018-08-24 DIAGNOSIS — R0781 Pleurodynia: Secondary | ICD-10-CM | POA: Diagnosis not present

## 2018-08-24 DIAGNOSIS — R7301 Impaired fasting glucose: Secondary | ICD-10-CM | POA: Diagnosis not present

## 2018-08-24 DIAGNOSIS — I10 Essential (primary) hypertension: Secondary | ICD-10-CM | POA: Diagnosis not present

## 2018-08-24 DIAGNOSIS — H029 Unspecified disorder of eyelid: Secondary | ICD-10-CM | POA: Diagnosis not present

## 2018-08-24 MED ORDER — AMLODIPINE BESYLATE 5 MG PO TABS: 5.0000 mg | ORAL_TABLET | Freq: Every day | ORAL | 3 refills | Status: DC

## 2018-08-24 MED ORDER — METOPROLOL SUCCINATE ER 50 MG PO TB24: 50.0000 mg | ORAL_TABLET | Freq: Every day | ORAL | 3 refills | Status: DC

## 2018-08-24 NOTE — Patient Instructions (Addendum)
Plan:  Refill sent for blood pressure medications, you have a years worth at [facility].  We will plan to get fasting blood work sometime in the next week or 2 to double check on the cholesterol and sugars.  Rib Xray today, but I think this is costochondritis. Can take Ibuprofen 400 mg 4 times per day as needed.

## 2018-08-24 NOTE — Progress Notes (Signed)
HPI: Mercedes Decker is a 77 y.o. female who  has a past medical history of Arthritis, Hypertension, Migraine, and Osteoporosis.  she presents to [facility] today, 08/24/18,  for chief complaint of:  Follow up on anxiety, BP Rib pain  Anxiety:  Doing well, not on lexapro, no refills needed   BP: No CP/SOB, no HA/VC  Rib pain: Left-sided rib pain, was visiting family at Christmas and got really tight hug and felt 1 of her ribs may be popped, has been a bit sore ever since with deep breaths.   Last labs for lipids 12/2016 Last labs for renal fxn and glc 07/2018: stable GFR, Glc above 100 Last A1C 12/2016 WNL at 5.3   Depression screen [facility] 2/9 08/24/2018 04/23/2018 05/08/2017  Decreased Interest 0 0 0  Down, Depressed, Hopeless 0 0 0  PHQ - 2 Score 0 0 0  Altered sleeping 0 0 -  Tired, decreased energy 2 0 -  Change in appetite 0 0 -  Feeling bad or failure about yourself  0 0 -  Trouble concentrating 0 0 -  Moving slowly or fidgety/restless 0 0 -  Suicidal thoughts 0 0 -  PHQ-9 Score 2 0 -  Difficult doing work/chores - Somewhat difficult -   GAD 7 : Generalized Anxiety Score 08/24/2018 04/23/2018  Nervous, Anxious, on Edge 0 0  Control/stop worrying 0 0  Worry too much - different things 0 0  Trouble relaxing 0 0  Restless 0 0  Easily annoyed or irritable 1 0  Afraid - awful might happen 0 0  Total GAD 7 Score 1 0  Anxiety Difficulty - Somewhat difficult         At today's visit... Past medical history, surgical history, and family history reviewed and updated as needed.  Current medication list and allergy/intolerance information reviewed and updated as needed. (See remainder of HPI, ROS, Phys Exam below)        ASSESSMENT/PLAN: The primary encounter diagnosis was Essential hypertension. Diagnoses of Generalized anxiety disorder, Eyelid lesion, Rib pain on left side, and Elevated fasting glucose were also pertinent to  this visit.    Orders Placed This Encounter  Procedures  . DG Ribs Unilateral Left  . Lipid panel  . Hemoglobin A1c  . Ambulatory referral to Ophthalmology     Meds ordered this encounter  Medications  . metoprolol succinate (TOPROL-XL) 50 MG 24 hr tablet    Sig: Take 1 tablet (50 mg total) by mouth daily. Take with or immediately following a meal.    Dispense:  90 tablet    Refill:  3    This prescription was filled on 11/20/2017. Any refills authorized will be placed on file.  Marland Kitchen amLODipine (NORVASC) 5 MG tablet    Sig: Take 1 tablet (5 mg total) by mouth daily. Pt needs f/u appt w/PCP for further refills.    Dispense:  90 tablet    Refill:  3    Patient Instructions  Plan:  Refill sent for blood pressure medications, you have a years worth at [facility].  We will plan to get fasting blood work sometime in the next week or 2 to double check on the cholesterol and sugars.  Rib Xray today, but I think this is costochondritis. Can take Ibuprofen 400 mg 4 times per day as needed.            Follow-up plan: Return for Annual checkup in 6 months with Dr. Sheppard Coil .                             ############################################ ############################################ ############################################ ############################################  Current Meds  Medication Sig  . amLODipine (NORVASC) 5 MG tablet Take 1 tablet (5 mg total) by mouth daily. Pt needs f/u appt w/PCP for further refills.  Marland Kitchen aspirin EC 81 MG tablet Take 1 tablet (81 mg total) by mouth daily.  Marland Kitchen CALCIUM-MAGNESIUM-ZINC PO Take by mouth.  . celecoxib (CELEBREX) 200 MG capsule One to 2 tablets by mouth daily as needed for pain.  . cholecalciferol (VITAMIN D) 1000 UNITS tablet Take 2,000 Units by mouth daily.  . Cyanocobalamin (VITAMIN B 12 PO) Take by mouth.  . denosumab (PROLIA) 60 MG/ML SOLN injection Inject 60 mg into the skin every 6  (six) months. Administer in upper arm, thigh, or abdomen  . metoprolol succinate (TOPROL-XL) 50 MG 24 hr tablet Take 1 tablet (50 mg total) by mouth daily. Take with or immediately following a meal.  . SUMAtriptan 6 MG/0.5ML SOAJ Inject 1 Syringe into the skin once. Inject 6 mg into skin at onset of headache one time  . triamcinolone cream (KENALOG) 0.1 % Apply 1 application topically 2 (two) times daily.  . vitamin C (ASCORBIC ACID) 500 MG tablet Take 500 mg by mouth daily.  . [DISCONTINUED] amLODipine (NORVASC) 5 MG tablet Take 1 tablet (5 mg total) by mouth daily. Pt needs f/u appt w/PCP for further refills.  . [DISCONTINUED] atorvastatin (LIPITOR) 20 MG tablet Take 1 tablet (20 mg total) by mouth daily.  . [DISCONTINUED] escitalopram (LEXAPRO) 10 MG tablet Take 1 tablet (10 mg total) by mouth daily.  . [DISCONTINUED] ipratropium (ATROVENT) 0.03 % nasal spray Place 2 sprays into both nostrils 4 (four) times daily as needed for rhinitis.  . [DISCONTINUED] metoprolol succinate (TOPROL-XL) 50 MG 24 hr tablet Take 1 tablet (50 mg total) by mouth daily. Take with or immediately following a meal.    Allergies  Allergen Reactions  . Tape Other (See Comments)    BANDAIDS AND ADHESIVE TAPES TEAR SKIN (VERY THIN SKIN)  REQUESTING HYPOALLERGENIC TAPE.  "OUCHLESS TAPE." IS OK   . Cortisone Other (See Comments)    Flushing and worsening joint pain   . Meloxicam Other (See Comments)    Too sleepy   . Ace Inhibitors Cough  . Atorvastatin Other (See Comments)    Possible allergy  . Losartan Other (See Comments)    Sweats, cramps  . Vicodin [Hydrocodone-Acetaminophen] Other (See Comments)    Pt states that she stops breathing when she takes  Pain meds       Review of Systems:  Constitutional: No recent illness  HEENT: No  headache, no vision change  Cardiac: No  chest pain, No  pressure, No palpitations  Respiratory:  No  shortness of breath. No  Cough  Gastrointestinal: No  abdominal  pain, no change on bowel habits  Musculoskeletal: +new myalgia/arthralgia ribs as per HPI  Skin: No  Rash  Neurologic: No  weakness, No  Dizziness  Psychiatric: No  concerns with depression, No  concerns with anxiety  Exam:  BP (!) 143/60 (BP Location: Left Arm, Patient Position: Sitting, Cuff Size: Normal)   Pulse 67   Temp 97.9 F (36.6 C) (Oral)   Wt 108 lb 11.2 oz (49.3 kg)   BMI 22.72 kg/m   Constitutional: VS see above. General Appearance: alert, well-developed, well-nourished, NAD  Eyes: Normal lids and conjunctive, non-icteric sclera  Ears, Nose, Mouth, Throat: MMM, Normal external inspection ears/nares/mouth/lips/gums.  Neck: No masses, trachea midline.   Respiratory: Normal respiratory effort. no wheeze, no rhonchi, no rales  Cardiovascular: S1/S2 normal, no murmur, no rub/gallop auscultated. RRR.   Musculoskeletal: Gait normal. Symmetric and independent movement of all extremities, chest wall tenderness left costochondral junction middle of rib cage area  Neurological: Normal balance/coordination. No tremor.  Skin: warm, dry, intact.   Psychiatric: Normal judgment/insight. Normal mood and affect. Oriented x3.       Visit summary with medication list and pertinent instructions was printed for patient to review, patient was advised to alert [place] if any updates are needed. All questions at time of visit were answered - patient instructed to contact office with any additional concerns. ER/RTC precautions were reviewed with the patient and understanding verbalized.     Please note: voice recognition software was used to produce this document, and typos may escape review. Please contact Dr. Sheppard Coil for any needed clarifications.    Follow up plan: Return for Annual checkup in 6 months with Dr. Sheppard Coil .

## 2018-08-27 DIAGNOSIS — I10 Essential (primary) hypertension: Secondary | ICD-10-CM | POA: Diagnosis not present

## 2018-08-27 DIAGNOSIS — R7301 Impaired fasting glucose: Secondary | ICD-10-CM | POA: Diagnosis not present

## 2018-08-28 ENCOUNTER — Encounter: Payer: Self-pay | Admitting: Physician Assistant

## 2018-08-28 DIAGNOSIS — R7303 Prediabetes: Secondary | ICD-10-CM | POA: Insufficient documentation

## 2018-08-28 HISTORY — DX: Prediabetes: R73.03

## 2018-08-28 LAB — HEMOGLOBIN A1C
Hgb A1c MFr Bld: 5.8 % of total Hgb — ABNORMAL HIGH (ref <5.7)
Mean Plasma Glucose: 120 (calc)
eAG (mmol/L): 6.6 (calc)

## 2018-08-28 LAB — LIPID PANEL
CHOLESTEROL: 268 mg/dL — AB (ref <200)
HDL: 76 mg/dL (ref >50)
LDL CHOLESTEROL (CALC): 161 mg/dL — AB
Non-HDL Cholesterol (Calc): 192 mg/dL (calc) — ABNORMAL HIGH (ref <130)
Total CHOL/HDL Ratio: 3.5 (calc) (ref <5.0)
Triglycerides: 160 mg/dL — ABNORMAL HIGH (ref <150)

## 2018-09-05 DIAGNOSIS — M810 Age-related osteoporosis without current pathological fracture: Secondary | ICD-10-CM | POA: Diagnosis not present

## 2018-09-05 DIAGNOSIS — M818 Other osteoporosis without current pathological fracture: Secondary | ICD-10-CM | POA: Diagnosis not present

## 2018-09-05 DIAGNOSIS — M8588 Other specified disorders of bone density and structure, other site: Secondary | ICD-10-CM | POA: Diagnosis not present

## 2018-09-12 DIAGNOSIS — H0012 Chalazion right lower eyelid: Secondary | ICD-10-CM | POA: Diagnosis not present

## 2018-09-12 DIAGNOSIS — H01009 Unspecified blepharitis unspecified eye, unspecified eyelid: Secondary | ICD-10-CM | POA: Diagnosis not present

## 2018-10-02 IMAGING — CT CT ABD-PELV W/ CM
2 of 5 series · 15 of 46 positions shown, 17 images · IV contrast (iopamidol)
Comparison: None.

CLINICAL DATA: Right-sided abdominal pain for 2 days. Clinical
suspicion for appendicitis.

EXAM:
CT ABDOMEN AND PELVIS WITH CONTRAST
TECHNIQUE: Multidetector CT imaging of the abdomen and pelvis was performed
using the standard protocol following bolus administration of
intravenous contrast.
CONTRAST:  100mL NXEOVX-B44 IOPAMIDOL (NXEOVX-B44) INJECTION 61%

[Series 2: axial st · axial · 0.69mm/px · z∈[+650,+1030]mm · 12 of 86 slices shown, 14 images]
[im 5/86  soft-tissue]
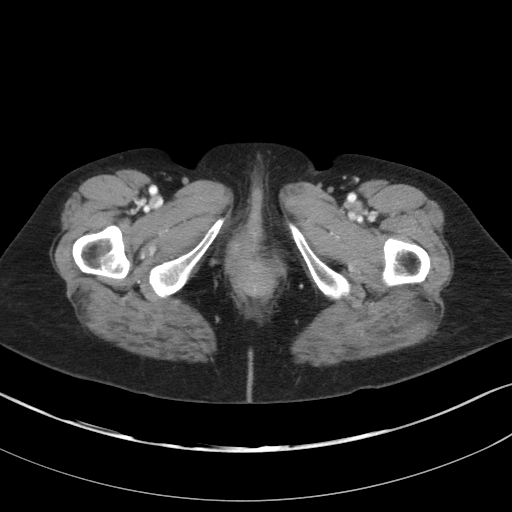
[im 5/86  bone]
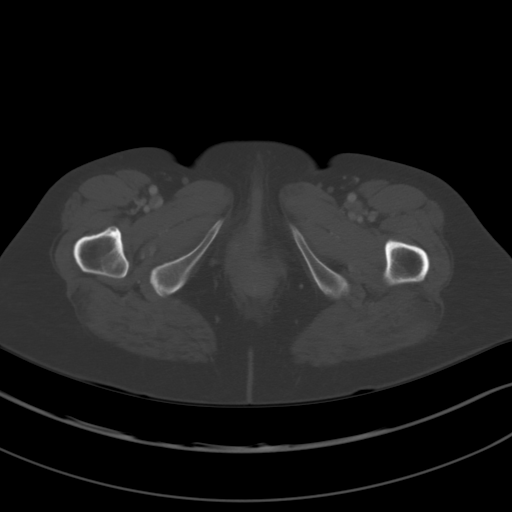
[im 13/86  soft-tissue]
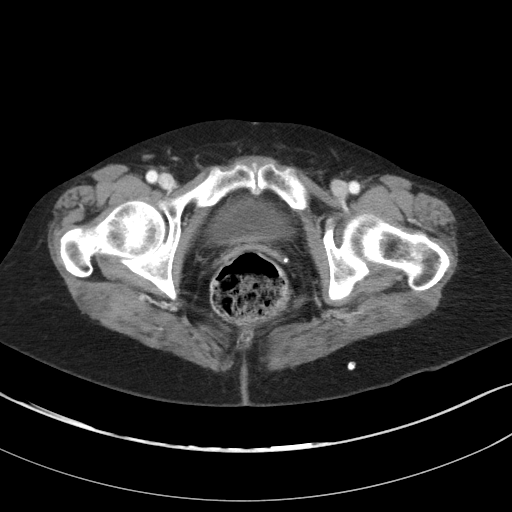
[im 18/86  soft-tissue]
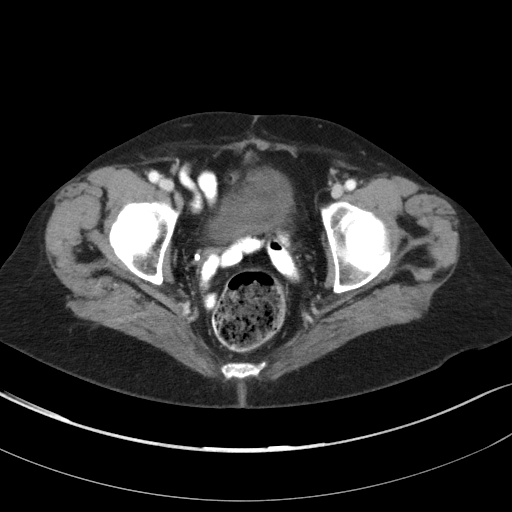
[im 26/86  soft-tissue]
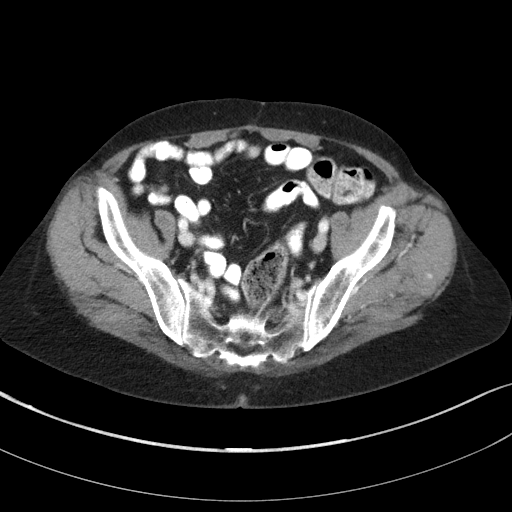
[im 35/86  soft-tissue]
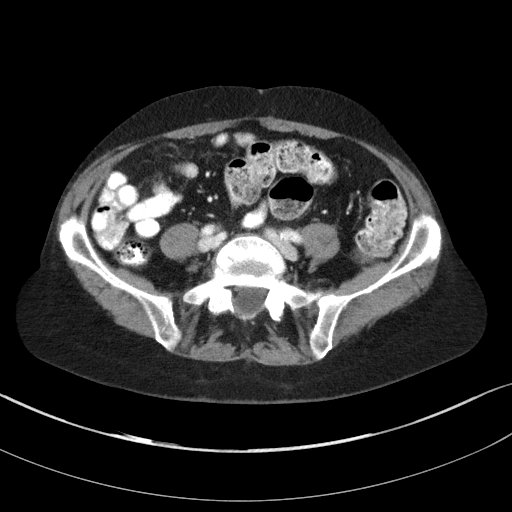
[im 39/86  soft-tissue]
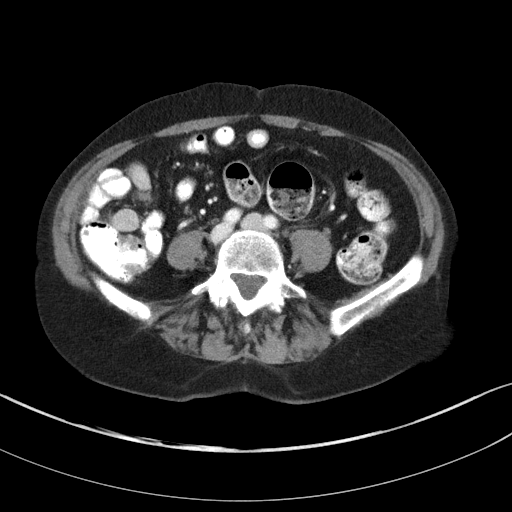
[im 47/86  soft-tissue]
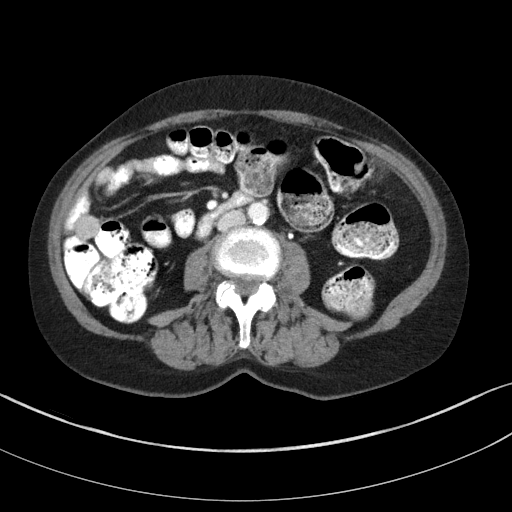
[im 52/86  soft-tissue]
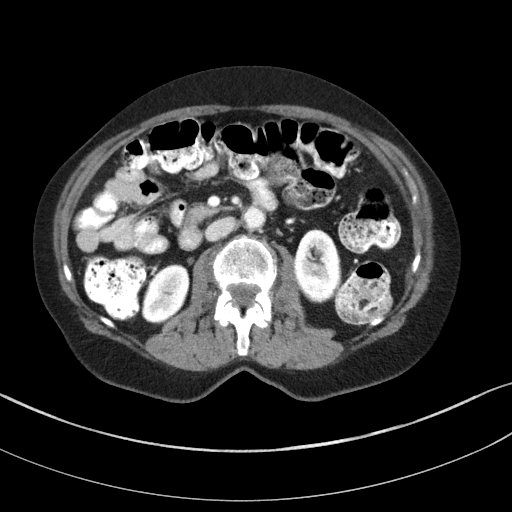
[im 60/86  soft-tissue]
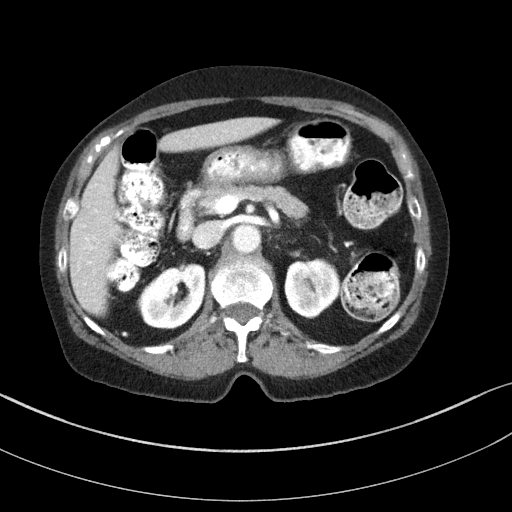
[im 60/86  bone]
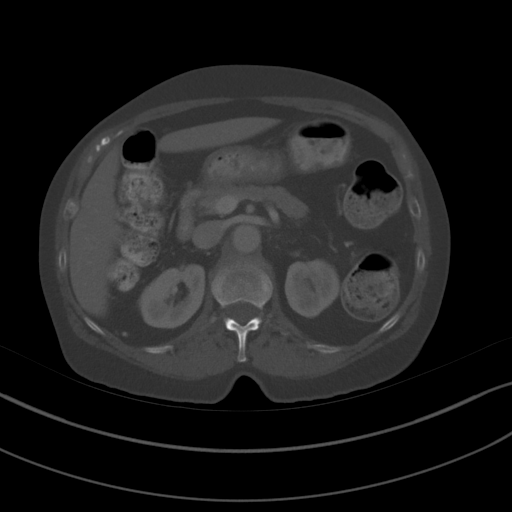
[im 69/86  soft-tissue]
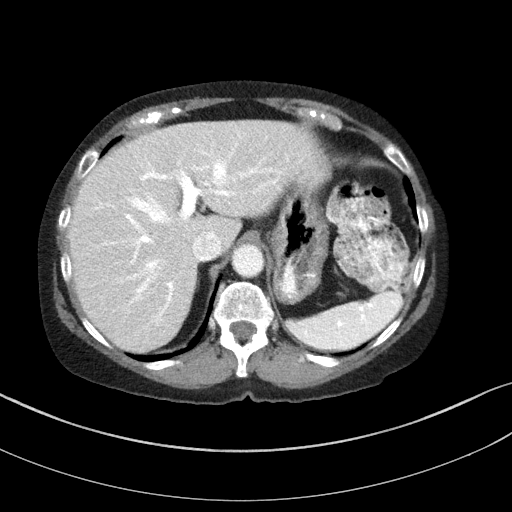
[im 73/86  soft-tissue]
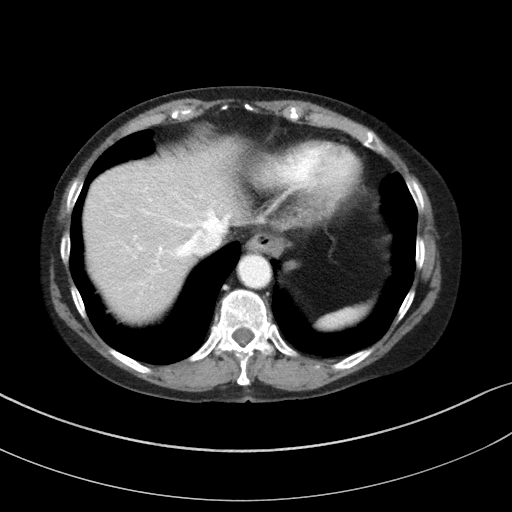
[im 81/86  soft-tissue]
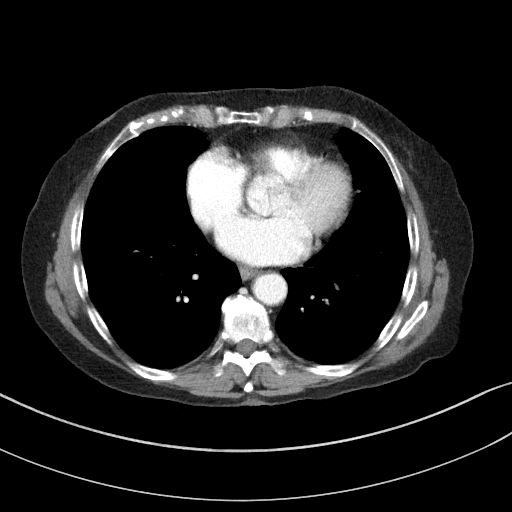

[Series 5: coronal st · coronal · 0.65mm/px · 3 of 79 slices shown]
[im 27/79  soft-tissue]
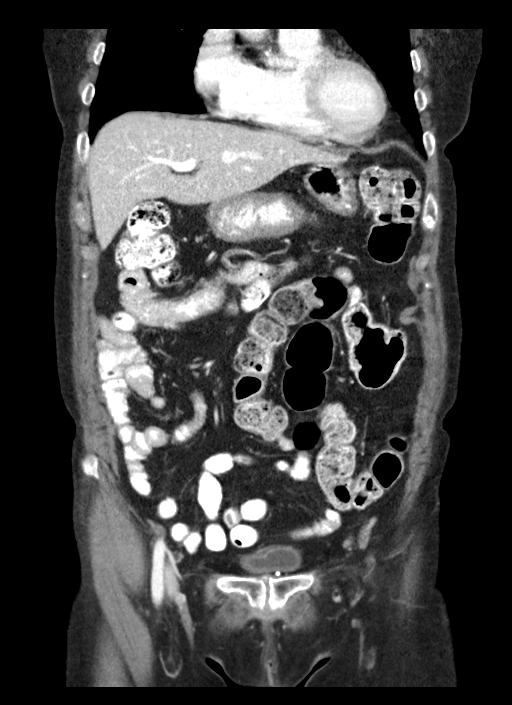
[im 35/79  soft-tissue]
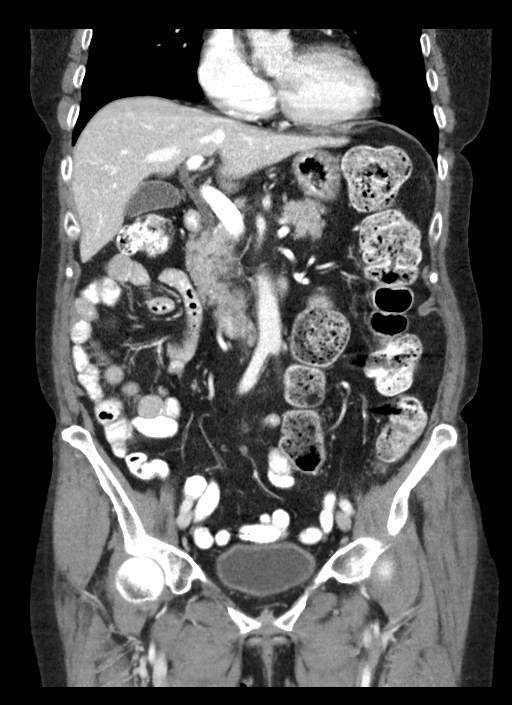
[im 44/79  soft-tissue]
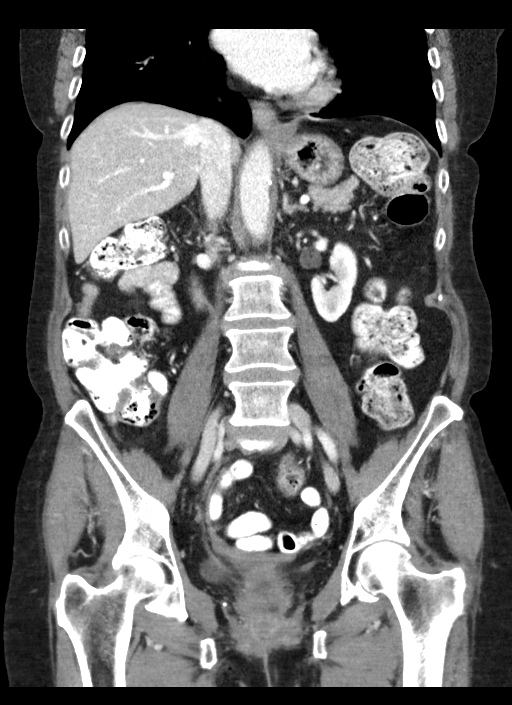

[15 of 46 positions shown; findings below may reference images not displayed]

FINDINGS: Lower chest: 2 mm right middle lobe pulmonary nodule seen on image 2
series 4. There may be another tiny nodule in the right middle lobe
on image 1.

Hepatobiliary: No focal abnormality within the liver parenchyma.
There is no evidence for gallstones, gallbladder wall thickening, or
pericholecystic fluid. No intrahepatic or extrahepatic biliary
dilation.

Pancreas: 17 mm hyperattenuating lesion is identified in the tail
the pancreas (image 23 series 2).

Spleen: No splenomegaly. No focal mass lesion.

Adrenals/Urinary Tract: No adrenal nodule or mass. Kidneys
unremarkable. No evidence for hydroureter. The urinary bladder
appears normal for the degree of distention.

Stomach/Bowel: Stomach is nondistended. No gastric wall thickening.
No evidence of outlet obstruction. Duodenum is normally positioned
as is the ligament of Treitz. No small bowel wall thickening. No
small bowel dilatation. The terminal ileum is normal. The appendix
is not visualized, but there is no edema or inflammation in the
region of the cecum. No gross colonic mass. No colonic wall
thickening. No substantial diverticular change. Prominent stool
volume.

Vascular/Lymphatic: There is abdominal aortic atherosclerosis
without aneurysm. There is no gastrohepatic or hepatoduodenal
ligament lymphadenopathy. No intraperitoneal or retroperitoneal
lymphadenopathy. No pelvic sidewall lymphadenopathy.

Reproductive: Uterus is surgically absent. There is no adnexal mass.

Other: No intraperitoneal free fluid.

Musculoskeletal: Bone windows reveal no worrisome lytic or sclerotic
osseous lesions.
IMPRESSION: 1. Nonvisualization of the appendix, but there is no edema or
inflammation around the cecal tip to suggest appendicitis. No other
etiology for right lower quadrant pain is identified. Specifically,
the terminal ileum is normal. No evidence for adnexal abnormality.
2. 17 mm enhancing lesion in the tail of pancreas. This is
suspicious for an islet cell tumor, but the lesion is essentially
contiguous with the splenic artery and splenic artery aneurysm is
not entirely excluded. Endoscopic ultrasound would likely prove
helpful to further evaluate. Follow-up CTA of the abdomen may also
prove helpful, as warranted.

## 2018-10-04 DIAGNOSIS — H00012 Hordeolum externum right lower eyelid: Secondary | ICD-10-CM | POA: Diagnosis not present

## 2018-10-04 DIAGNOSIS — H01009 Unspecified blepharitis unspecified eye, unspecified eyelid: Secondary | ICD-10-CM | POA: Diagnosis not present

## 2018-10-12 DIAGNOSIS — D23112 Other benign neoplasm of skin of right lower eyelid, including canthus: Secondary | ICD-10-CM | POA: Diagnosis not present

## 2018-10-12 DIAGNOSIS — D22112 Melanocytic nevi of right lower eyelid, including canthus: Secondary | ICD-10-CM | POA: Diagnosis not present

## 2018-10-18 ENCOUNTER — Ambulatory Visit (INDEPENDENT_AMBULATORY_CARE_PROVIDER_SITE_OTHER): Payer: Medicare Other | Admitting: Family Medicine

## 2018-10-18 VITALS — BP 142/63 | HR 75 | Ht <= 58 in | Wt 109.0 lb

## 2018-10-18 DIAGNOSIS — M62838 Other muscle spasm: Secondary | ICD-10-CM | POA: Diagnosis not present

## 2018-10-18 MED ORDER — CYCLOBENZAPRINE HCL 5 MG PO TABS: 5.0000 mg | ORAL_TABLET | Freq: Every evening | ORAL | 0 refills | Status: DC | PRN

## 2018-10-18 NOTE — Patient Instructions (Addendum)
Thank you for coming in today. Attend PT.  Use muscle relexer sparingly.  Use heating pad and TENS unit.  Use celebrex.  Ok to take with tylenol.   Ok to use over the counter Lidocaine patches  Physical therapy is most helpful.   TENS UNIT: This is helpful for muscle pain and spasm.   Search and Purchase a TENS 7000 2nd edition at  www.tenspros.com or www.[employer].com It should be less than $30.     TENS unit instructions: Do not shower or bathe with the unit on Turn the unit off before removing electrodes or batteries If the electrodes lose stickiness add a drop of water to the electrodes after they are disconnected from the unit and place on plastic sheet. If you continued to have difficulty, call the TENS unit company to purchase more electrodes. Do not apply lotion on the skin area prior to use. Make sure the skin is clean and dry as this will help prolong the life of the electrodes. After use, always check skin for unusual red areas, rash or other skin difficulties. If there are any skin problems, does not apply electrodes to the same area. Never remove the electrodes from the unit by pulling the wires. Do not use the TENS unit or electrodes other than as directed. Do not change electrode placement without consultating your therapist or physician. Keep 2 fingers with between each electrode. Wear time ratio is 2:1, on to off times.    For example on for 30 minutes off for 15 minutes and then on for 30 minutes off for 15 minutes      Cervical Strain and Sprain Rehab Ask your health care provider which exercises are safe for you. Do exercises exactly as told by your health care provider and adjust them as directed. It is normal to feel mild stretching, pulling, tightness, or discomfort as you do these exercises, but you should stop right away if you feel sudden pain or your pain gets worse.Do not begin these exercises until told by your health care provider. Stretching and  range of motion exercises These exercises warm up your muscles and joints and improve the movement and flexibility of your neck. These exercises also help to relieve pain, numbness, and tingling. Exercise A: Cervical side bend  1. Using good posture, sit on a stable chair or stand up. 2. Without moving your shoulders, slowly tilt your left / right ear to your shoulder until you feel a stretch in your neck muscles. You should be looking straight ahead. 3. Hold for __________ seconds. 4. Repeat with the other side of your neck. Repeat __________ times. Complete this exercise __________ times a day. Exercise B: Cervical rotation  1. Using good posture, sit on a stable chair or stand up. 2. Slowly turn your head to the side as if you are looking over your left / right shoulder. ? Keep your eyes level with the ground. ? Stop when you feel a stretch along the side and the back of your neck. 3. Hold for __________ seconds. 4. Repeat this by turning to your other side. Repeat __________ times. Complete this exercise __________ times a day. Exercise C: Thoracic extension and pectoral stretch 1. Roll a towel or a small blanket so it is about 4 inches (10 cm) in diameter. 2. Lie down on your back on a firm surface. 3. Put the towel lengthwise, under your spine in the middle of your back. It should not be not under your shoulder blades.  The towel should line up with your spine from your middle back to your lower back. 4. Put your hands behind your head and let your elbows fall out to your sides. 5. Hold for __________ seconds. Repeat __________ times. Complete this exercise __________ times a day. Strengthening exercises These exercises build strength and endurance in your neck. Endurance is the ability to use your muscles for a long time, even after your muscles get tired. Exercise D: Upper cervical flexion, isometric 1. Lie on your back with a thin pillow behind your head and a small rolled-up  towel under your neck. 2. Gently tuck your chin toward your chest and nod your head down to look toward your feet. Do not lift your head off the pillow. 3. Hold for __________ seconds. 4. Release the tension slowly. Relax your neck muscles completely before you repeat this exercise. Repeat __________ times. Complete this exercise __________ times a day. Exercise E: Cervical extension, isometric  1. Stand about 6 inches (15 cm) away from a wall, with your back facing the wall. 2. Place a soft object, about 6-8 inches (15-20 cm) in diameter, between the back of your head and the wall. A soft object could be a small pillow, a ball, or a folded towel. 3. Gently tilt your head back and press into the soft object. Keep your jaw and forehead relaxed. 4. Hold for __________ seconds. 5. Release the tension slowly. Relax your neck muscles completely before you repeat this exercise. Repeat __________ times. Complete this exercise __________ times a day. Posture and body mechanics Body mechanics refers to the movements and positions of your body while you do your daily activities. Posture is part of body mechanics. Good posture and healthy body mechanics can help to relieve stress in your body's tissues and joints. Good posture means that your spine is in its natural S-curve position (your spine is neutral), your shoulders are pulled back slightly, and your head is not tipped forward. The following are general guidelines for applying improved posture and body mechanics to your everyday activities. Standing   When standing, keep your spine neutral and keep your feet about hip-width apart. Keep a slight bend in your knees. Your ears, shoulders, and hips should line up.  When you do a task in which you stand in one place for a long time, place one foot up on a stable object that is 2-4 inches (5-10 cm) high, such as a footstool. This helps keep your spine neutral. Sitting   When sitting, keep your spine  neutral and your keep feet flat on the floor. Use a footrest, if necessary, and keep your thighs parallel to the floor. Avoid rounding your shoulders, and avoid tilting your head forward.  When working at a desk or a computer, keep your desk at a height where your hands are slightly lower than your elbows. Slide your chair under your desk so you are close enough to maintain good posture.  When working at a computer, place your monitor at a height where you are looking straight ahead and you do not have to tilt your head forward or downward to look at the screen. Resting When lying down and resting, avoid positions that are most painful for you. Try to support your neck in a neutral position. You can use a contour pillow or a small rolled-up towel. Your pillow should support your neck but not push on it. This information is not intended to replace advice given to you by your  health care provider. Make sure you discuss any questions you have with your health care provider. Document Released: 08/15/2005 Document Revised: 04/21/2016 Document Reviewed: 07/22/2015 Elsevier Interactive Patient Education  2019 [employer].

## 2018-10-18 NOTE — Progress Notes (Signed)
Mercedes Decker is a 78 y.o. female who presents to [facility] today for neck pain.   Mercedes Decker notes pain in the posterior neck starting over the past few days. She notes that the pain is moderate to severe. The pain is worse with activity and motion. She denies any radiating pain, weakness or numbness. She feels well otherwise with no fever or chills. She denies any injury. She has tried some over the counter medicines that have helped a bit.     ROS:  As above  Exam:  BP (!) 142/63   Pulse 75   Ht 4\' 10"  (1.473 m)   Wt 109 lb (49.4 kg)   BMI 22.78 kg/m  Wt Readings from Last 5 Encounters:  10/18/18 109 lb (49.4 kg)  08/24/18 108 lb 11.2 oz (49.3 kg)  08/14/18 108 lb (49 kg)  07/04/18 108 lb (49 kg)  06/14/18 104 lb (47.2 kg)   General: Well Developed, well nourished, and in no acute distress.  Neuro/Psych: Alert and oriented x3, extra-ocular muscles intact, able to move all 4 extremities, sensation grossly intact. Skin: Warm and dry, no rashes noted.  Respiratory: Not using accessory muscles, speaking in full sentences, trachea midline.  Cardiovascular: Pulses palpable, no extremity edema. Abdomen: Does not appear distended. MSK:  Cspine: Non-tender to spinal midline.  TTP BL perispinal muscles and traps.  Limited ROM due to pain.  Upper extremity strength, sensation and reflex intact BL  Normal coordination, balance and gait.       Assessment and Plan: 78 y.o. female with  Cervical pain very likely due to myofascial pain and spasm.  Plan to treat with physical therapy, celebrex, tylenol, heating pad, tens unit. Will use limited flexeril. Cautioned about risk on confusion or falls.   Recheck if not improving.    Orders Placed This Encounter  Procedures  . Ambulatory referral to Physical Therapy    Referral Priority:   Routine    Referral Type:   Physical Medicine    Referral Reason:   Specialty Services Required      Requested Specialty:   Physical Therapy   Meds ordered this encounter  Medications  . cyclobenzaprine (FLEXERIL) 5 MG tablet    Sig: Take 1 tablet (5 mg total) by mouth at bedtime as needed for muscle spasms.    Dispense:  20 tablet    Refill:  0    Historical information moved to improve visibility of documentation.  Past Medical History:  Diagnosis Date  . Arthritis   . Hypertension   . Migraine   . Osteoporosis   . Prediabetes 08/28/2018   Past Surgical History:  Procedure Laterality Date  . ABDOMINAL HYSTERECTOMY    . BACK SURGERY    . BREAST SURGERY    . BUNIONECTOMY    . INCISIONAL HERNIA REPAIR    . KNEE ARTHROSCOPY     Social History   Tobacco Use  . Smoking status: Never Smoker  . Smokeless tobacco: Never Used  Substance Use Topics  . Alcohol use: No   family history includes Alzheimer's disease in her mother; Cancer in her brother and sister; Diabetes in her brother and sister; Heart disease in her brother and father.  Medications: Current Outpatient Medications  Medication Sig Dispense Refill  . amLODipine (NORVASC) 5 MG tablet Take 1 tablet (5 mg total) by mouth daily. Pt needs f/u appt w/PCP for further refills. 90 tablet 3  . aspirin EC 81 MG tablet  Take 1 tablet (81 mg total) by mouth daily.    Marland Kitchen CALCIUM-MAGNESIUM-ZINC PO Take by mouth.    . celecoxib (CELEBREX) 200 MG capsule One to 2 tablets by mouth daily as needed for pain. 60 capsule 2  . cholecalciferol (VITAMIN D) 1000 UNITS tablet Take 2,000 Units by mouth daily.    . Cyanocobalamin (VITAMIN B 12 PO) Take by mouth.    . denosumab (PROLIA) 60 MG/ML SOLN injection Inject 60 mg into the skin every 6 (six) months. Administer in upper arm, thigh, or abdomen    . metoprolol succinate (TOPROL-XL) 50 MG 24 hr tablet Take 1 tablet (50 mg total) by mouth daily. Take with or immediately following a meal. 90 tablet 3  . SUMAtriptan 6 MG/0.5ML SOAJ Inject 1 Syringe into the skin once. Inject 6 mg into  skin at onset of headache one time 1 Syringe 3  . triamcinolone cream (KENALOG) 0.1 % Apply 1 application topically 2 (two) times daily. 30 g 0  . vitamin C (ASCORBIC ACID) 500 MG tablet Take 500 mg by mouth daily.    . cyclobenzaprine (FLEXERIL) 5 MG tablet Take 1 tablet (5 mg total) by mouth at bedtime as needed for muscle spasms. 20 tablet 0   No current facility-administered medications for this visit.    Allergies  Allergen Reactions  . Tape Other (See Comments)    BANDAIDS AND ADHESIVE TAPES TEAR SKIN (VERY THIN SKIN)  REQUESTING HYPOALLERGENIC TAPE.  "OUCHLESS TAPE." IS OK   . Cortisone Other (See Comments)    Flushing and worsening joint pain   . Meloxicam Other (See Comments)    Too sleepy   . Ace Inhibitors Cough  . Atorvastatin Other (See Comments)    Possible allergy  . Losartan Other (See Comments)    Sweats, cramps  . Vicodin [Hydrocodone-Acetaminophen] Other (See Comments)    Pt states that she stops breathing when she takes  Pain meds      Discussed warning signs or symptoms. Please see discharge instructions. Patient expresses understanding.

## 2018-10-24 ENCOUNTER — Other Ambulatory Visit: Payer: Self-pay

## 2018-10-24 ENCOUNTER — Ambulatory Visit (INDEPENDENT_AMBULATORY_CARE_PROVIDER_SITE_OTHER): Payer: Medicare Other | Admitting: Rehabilitative and Restorative Service Providers"

## 2018-10-24 ENCOUNTER — Encounter: Payer: Self-pay | Admitting: Rehabilitative and Restorative Service Providers"

## 2018-10-24 DIAGNOSIS — M542 Cervicalgia: Secondary | ICD-10-CM

## 2018-10-24 DIAGNOSIS — K219 Gastro-esophageal reflux disease without esophagitis: Secondary | ICD-10-CM | POA: Diagnosis not present

## 2018-10-24 DIAGNOSIS — R29898 Other symptoms and signs involving the musculoskeletal system: Secondary | ICD-10-CM

## 2018-10-24 DIAGNOSIS — Z9081 Acquired absence of spleen: Secondary | ICD-10-CM | POA: Diagnosis not present

## 2018-10-24 DIAGNOSIS — M818 Other osteoporosis without current pathological fracture: Secondary | ICD-10-CM | POA: Diagnosis not present

## 2018-10-24 DIAGNOSIS — I10 Essential (primary) hypertension: Secondary | ICD-10-CM | POA: Diagnosis not present

## 2018-10-24 DIAGNOSIS — R293 Abnormal posture: Secondary | ICD-10-CM

## 2018-10-24 DIAGNOSIS — M1811 Unilateral primary osteoarthritis of first carpometacarpal joint, right hand: Secondary | ICD-10-CM | POA: Diagnosis not present

## 2018-10-24 NOTE — Patient Instructions (Signed)
Access Code: [email or web address]  URL: [email or web address]  Date: 10/24/2018  Prepared by: Gillermo Murdoch   Exercises  Seated Cervical Retraction - 10 reps - 1 sets - 3x daily - 7x weekly  Standing Scapular Retraction - 10 reps - 1 sets - 10 hold - 3x daily - 7x weekly  Shoulder External Rotation and Scapular Retraction - 10 reps - 1 sets - hold - 3x daily - 7x weekly  Standing Scapular Retraction in Abduction - 10 reps - 1 sets - 3x daily - 7x weekly  Doorway Pec Stretch at 60 Degrees Abduction - 3 reps - 1 sets - 3x daily - 7x weekly  Doorway Pec Stretch at 90 Degrees Abduction - 3 reps - 1 sets - 30 seconds hold - 3x daily - 7x weekly  Doorway Pec Stretch at 120 Degrees Abduction - 3 reps - 1 sets - 30 second hold hold - 3x daily - 7x weekly

## 2018-10-24 NOTE — Therapy (Signed)
[facility]  [address] [place], [place], [address] Phone: [phone]   Fax:  [phone]  Physical Therapy Evaluation  Patient Details  Name: Mercedes Decker MRN: [id number] Date of Birth: [birth year] Referring Provider (PT): Dr Mercedes Decker    Encounter Date: 10/24/2018  PT End of Session - 10/24/18 0711    Visit Number  1    Number of Visits  12    Date for PT Re-Evaluation  12/05/18    PT Start Time  0711    PT Stop Time  0810    PT Time Calculation (min)  59 min    Activity Tolerance  Patient tolerated treatment well       Past Medical History:  Diagnosis Date  . Arthritis   . Hypertension   . Migraine   . Osteoporosis   . Prediabetes 08/28/2018    Past Surgical History:  Procedure Laterality Date  . ABDOMINAL HYSTERECTOMY    . BACK SURGERY    . BREAST SURGERY    . BUNIONECTOMY    . INCISIONAL HERNIA REPAIR    . KNEE ARTHROSCOPY      There were no vitals filed for this visit.   Subjective Assessment - 10/24/18 0717    Subjective  Patient reports that she awoke with neck pain 10/15/2018 and symtpoms have persisted since that time. She has no known injury or irritation. Some improvement in the past week but she continues to have pain.     Pertinent History  Rt hip pain/bursitis     Patient Stated Goals  get rid of the pain    Currently in Pain?  Yes    Pain Score  7     Pain Location  Neck    Pain Orientation  Right;Left;Posterior    Pain Descriptors / Indicators  Nagging;Sharp;Tightness    Pain Type  Acute pain    Pain Radiating Towards  to shoulders Rt > Lt     Pain Onset  1 to 4 weeks ago    Pain Frequency  Constant    Aggravating Factors   turning head; looking up; lifting; reaching    Pain Relieving Factors  meds; heat; TENS          [facility] PT Assessment - 10/24/18 0001      Assessment   Medical Diagnosis  Cervical dysfunction    Referring Provider (PT)  Dr Mercedes Decker     Onset  Date/Surgical Date  10/15/18    Hand Dominance  Right    Next MD Visit  PRN     Prior Therapy  here for hip       Precautions   Precautions  None      Balance Screen   Has the patient fallen in the past 6 months  No    Has the patient had a decrease in activity level because of a fear of falling?   No    Is the patient reluctant to leave their home because of a fear of falling?   No      Prior Function   Level of Independence  Independent    Vocation  Retired    Leisure  household chores; yard work; walking 3 miles daily/exercise bike 30 min       Observation/Other Assessments   Focus on Therapeutic Outcomes (FOTO)   57% limitation       Sensation   Additional Comments  WFL's per pt report  Posture/Postural Control   Posture Comments  head forward; shoulders rounded and elevated       AROM   Overall AROM Comments  pain with all cervical motions     Right/Left Shoulder  --   limited and painful end ranges with elevation Rt > Lt    Cervical Flexion  40    Cervical Extension  20    Cervical - Right Side Bend  19    Cervical - Left Side Bend  18    Cervical - Right Rotation  37    Cervical - Left Rotation  43      Strength   Overall Strength Comments  WFL's bilat UE's      Palpation   Spinal mobility  hypomobility cervical and upper thoracic spine     Palpation comment  significant muscular tightness through the ant/lat/post cervical musculature; upper traps; leveator; pecs; teres Rt > Lt                 Objective measurements completed on examination: See above findings.      [facility] Adult PT Treatment/Exercise - 10/24/18 0001      Neuro Re-ed    Neuro Re-ed Details   postural correction       Shoulder Exercises: Standing   Other Standing Exercises  axial extension to pt tolerance 10 sec x 5; scap saueeze 10 sec x 10; L's x 10; W's x 5 with swim noodle       Shoulder Exercises: Stretch   Other Shoulder Stretches  3 position doorway stretch 30 sec x  2 reps each position       Moist Heat Therapy   Number Minutes Moist Heat  20 Minutes    Moist Heat Location  Cervical;Shoulder      Electrical Stimulation   Electrical Stimulation Location  bilat cervical and upper traps    Electrical Stimulation Action  IFC    Electrical Stimulation Parameters  to tolerance    Electrical Stimulation Goals  Pain;Tone      Manual Therapy   Manual therapy comments  pt supine hooklying     Soft tissue mobilization  soft tissue work to pt tolerance through cervical and posterior shoulder girdle through the upper traps/leveator/; cervical and thoracic paraspinals     Myofascial Release  upper traps             PT Education - 10/24/18 0928    Education Details  HEP     Person(s) Educated  Patient    Methods  Explanation;Demonstration;Tactile cues;Verbal cues;Handout    Comprehension  Verbalized understanding;Returned demonstration;Verbal cues required;Tactile cues required          PT Long Term Goals - 10/24/18 0929      PT LONG TERM GOAL #1   Title  Decrease pain in the cervical and shoulder girdle areas by 50-75% allowing patient to participate in all normal and functional activities 12/04/2018    Time  6    Period  Weeks    Status  New      PT LONG TERM GOAL #2   Title  Increase ROM in cervical spine and bilat shoulders to WFL's without pain 12/04/2018    Time  6    Period  Weeks    Status  New      PT LONG TERM GOAL #3   Title  Improve posture and alignment with patient to demonstrated engaged posterior shoulder girdle musculature 12/04/2018    Time  6  Period  Weeks    Status  New      PT LONG TERM GOAL #4   Title  Independent in HEP 12/04/2018    Time  6    Period  Weeks    Status  New      PT LONG TERM GOAL #5   Title  Improve FOTO to </= 38% limitation 12/04/2018    Time  6    Period  Weeks    Status  New             Plan - 10/24/18 0743    Clinical Impression Statement  Mercedes Decker presents with cervical pain and  dysfunction through the cervical and shoulder girdle area. She has poor posture and alignment; limited UE and cervical mobility and ROM; muscular tightness to palpation; pain limiting functional activity and rest. Patient will benefit from PT to address problems identified.     Clinical Presentation  Evolving    Clinical Decision Making  Low    Rehab Potential  Good    PT Frequency  2x / week    PT Duration  6 weeks    PT Treatment/Interventions  Patient/family education;ADLs/Self Care Home Management;Cryotherapy;Electrical Stimulation;Iontophoresis 4mg /ml Dexamethasone;Moist Heat;Ultrasound;Therapeutic activities;Therapeutic exercise;Neuromuscular re-education;Dry needling;Passive range of motion;Manual techniques    PT Next Visit Plan  review HEP; progress with postural correction and education; manual work vs DN; modalities as indicated     PT Home Exercise Plan  Access Code: [email or web address]     Consulted and Agree with Plan of Care  Patient       Patient will benefit from skilled therapeutic intervention in order to improve the following deficits and impairments:  Postural dysfunction, Improper body mechanics, Pain, Increased fascial restricitons, Increased muscle spasms, Hypomobility, Decreased mobility, Decreased range of motion, Decreased activity tolerance  Visit Diagnosis: Cervicalgia - Plan: PT plan of care cert/re-cert  Other symptoms and signs involving the musculoskeletal system - Plan: PT plan of care cert/re-cert  Abnormal posture - Plan: PT plan of care cert/re-cert     Problem List Patient Active Problem List   Diagnosis Date Noted  . Prediabetes 08/28/2018  . Fall 02/16/2018  . Primary osteoarthritis of both knees 11/21/2017  . Greater trochanteric bursitis of right hip 10/23/2017  . Pancreatic tumor 05/08/2017  . Hoarseness 02/10/2017  . Right shoulder injury 08/18/2016  . TIA (transient ischemic attack) 03/08/2016  . Right hand pain 03/07/2016  . Patient has active  power of attorney for health care 11/30/2015  . Essential hypertension 12/25/2014  . Murmur, cardiac 09/11/2012  . DJD (degenerative joint disease) 09/03/2012  . S/P hysterectomy 09/03/2012  . Osteoporosis  L fem neck  Dexa 07/2014  -3.1 09/03/2012  . Migraine 09/03/2012  . History of anemia 09/03/2012  . Mixed hyperlipidemia 09/03/2012  . GERD (gastroesophageal reflux disease) 09/03/2012    Abrar Bilton Nilda Simmer PT, MPH  10/24/2018, 9:33 AM  [facility] [address] [place], [place], [address] Phone: [phone]   Fax:  [phone]  Name: Mercedes Decker MRN: [id number] Date of Birth: 1941-05-29

## 2018-10-29 ENCOUNTER — Ambulatory Visit (INDEPENDENT_AMBULATORY_CARE_PROVIDER_SITE_OTHER): Payer: Medicare Other | Admitting: Rehabilitative and Restorative Service Providers"

## 2018-10-29 ENCOUNTER — Encounter: Payer: Self-pay | Admitting: Rehabilitative and Restorative Service Providers"

## 2018-10-29 DIAGNOSIS — M542 Cervicalgia: Secondary | ICD-10-CM | POA: Diagnosis not present

## 2018-10-29 DIAGNOSIS — R29898 Other symptoms and signs involving the musculoskeletal system: Secondary | ICD-10-CM

## 2018-10-29 DIAGNOSIS — R293 Abnormal posture: Secondary | ICD-10-CM | POA: Diagnosis not present

## 2018-10-29 NOTE — Patient Instructions (Signed)

## 2018-10-29 NOTE — Therapy (Signed)
[place], [place], [address] Phone: [phone]   Fax:  [phone]  Physical Therapy Treatment  Patient Details  Name: Mercedes Decker MRN: [id number] Date of Birth: Nov 02, 1940 Referring Provider (PT): Dr Lynne Leader    Encounter Date: 10/29/2018  PT End of Session - 10/29/18 0850    Visit Number  2    Number of Visits  12    Date for PT Re-Evaluation  12/05/18    PT Start Time  0845    PT Stop Time  0941    PT Time Calculation (min)  56 min    Activity Tolerance  Patient tolerated treatment well       Past Medical History:  Diagnosis Date  . Arthritis   . Hypertension   . Migraine   . Osteoporosis   . Prediabetes 08/28/2018    Past Surgical History:  Procedure Laterality Date  . ABDOMINAL HYSTERECTOMY    . BACK SURGERY    . BREAST SURGERY    . BUNIONECTOMY    . INCISIONAL HERNIA REPAIR    . KNEE ARTHROSCOPY      There were no vitals filed for this visit.  Subjective Assessment - 10/29/18 0851    Subjective  Patient reports that she has terrible pain Wednesday night - did not sleep all Wednesday night. Has improved since that time.     Currently in Pain?  Yes    Pain Score  4     Pain Orientation  Right;Posterior    Pain Descriptors / Indicators  Dull;Nagging    Pain Type  Acute pain    Pain Onset  1 to 4 weeks ago    Pain Frequency  Constant                       [facility] Adult PT Treatment/Exercise - 10/29/18 0001      Shoulder Exercises: Standing   Other Standing Exercises  axial extension to pt tolerance 10 sec x 5; scap saueeze 10 sec x 10; L's x 10; W's x 5 with swim noodle       Shoulder Exercises: Stretch   Other Shoulder Stretches  3 position doorway stretch 30 sec x 2 reps each position       Moist Heat Therapy   Number Minutes Moist Heat  20 Minutes    Moist Heat Location  Cervical;Shoulder      Electrical Stimulation   Electrical Stimulation  Location  Rt cervical and upper traps    Electrical Stimulation Action  IFC    Electrical Stimulation Parameters  to tolerance    Electrical Stimulation Goals  Pain;Tone      Manual Therapy   Manual therapy comments  pt supine hooklying and prone     Soft tissue mobilization  soft tissue work to pt tolerance through cervical and posterior shoulder girdle through the upper traps/leveator/; cervical and thoracic paraspinals     Myofascial Release  anterior/posterior/lateral cervical musculature; upper traps into occipital area; cervical and thoracic paraspinals     Manual Traction  cervical traction 1-2 reps for 15-20 sec              PT Education - 10/29/18 0925    Education Details  DN    Person(s) Educated  Patient    Methods  Explanation;Demonstration;Tactile cues;Verbal cues;Handout    Comprehension  Verbalized understanding;Returned demonstration;Verbal cues required;Tactile cues required  PT Long Term Goals - 10/24/18 0929      PT LONG TERM GOAL #1   Title  Decrease pain in the cervical and shoulder girdle areas by 50-75% allowing patient to participate in all normal and functional activities 12/04/2018    Time  6    Period  Weeks    Status  New      PT LONG TERM GOAL #2   Title  Increase ROM in cervical spine and bilat shoulders to WFL's without pain 12/04/2018    Time  6    Period  Weeks    Status  New      PT LONG TERM GOAL #3   Title  Improve posture and alignment with patient to demonstrated engaged posterior shoulder girdle musculature 12/04/2018    Time  6    Period  Weeks    Status  New      PT LONG TERM GOAL #4   Title  Independent in HEP 12/04/2018    Time  6    Period  Weeks    Status  New      PT LONG TERM GOAL #5   Title  Improve FOTO to </= 38% limitation 12/04/2018    Time  6    Period  Weeks    Status  New            Plan - 10/29/18 8563    Clinical Impression Statement  Increased pain the evening after PT eval and inital  treatment but symptoms have improved since that time. Continued muscular tightness through the cervical and shoulder girdle areas Rt > Lt. Patient tolerated trial of DN followed by manual work well with good release of continued tightness in the Rt cervical area.     Rehab Potential  Good    PT Frequency  2x / week    PT Duration  6 weeks    PT Treatment/Interventions  Patient/family education;ADLs/Self Care Home Management;Cryotherapy;Electrical Stimulation;Iontophoresis 4mg /ml Dexamethasone;Moist Heat;Ultrasound;Therapeutic activities;Therapeutic exercise;Neuromuscular re-education;Dry needling;Passive range of motion;Manual techniques    PT Next Visit Plan  review HEP; progress with postural correction and education; manual work; assess response to DN; modalities as indicated     PT Home Exercise Plan  Access Code: [email or web address]     Consulted and Agree with Plan of Care  Patient       Patient will benefit from skilled therapeutic intervention in order to improve the following deficits and impairments:  Postural dysfunction, Improper body mechanics, Pain, Increased fascial restricitons, Increased muscle spasms, Hypomobility, Decreased mobility, Decreased range of motion, Decreased activity tolerance  Visit Diagnosis: Cervicalgia  Other symptoms and signs involving the musculoskeletal system  Abnormal posture     Problem List Patient Active Problem List   Diagnosis Date Noted  . Prediabetes 08/28/2018  . Fall 02/16/2018  . Primary osteoarthritis of both knees 11/21/2017  . Greater trochanteric bursitis of right hip 10/23/2017  . Pancreatic tumor 05/08/2017  . Hoarseness 02/10/2017  . Right shoulder injury 08/18/2016  . TIA (transient ischemic attack) 03/08/2016  . Right hand pain 03/07/2016  . Patient has active power of attorney for health care 11/30/2015  . Essential hypertension 12/25/2014  . Murmur, cardiac 09/11/2012  . DJD (degenerative joint disease) 09/03/2012  . S/P  hysterectomy 09/03/2012  . Osteoporosis  L fem neck  Dexa 07/2014  -3.1 09/03/2012  . Migraine 09/03/2012  . History of anemia 09/03/2012  . Mixed hyperlipidemia 09/03/2012  . GERD (gastroesophageal reflux disease) 09/03/2012  [place], MPH  10/29/2018, 9:28 AM  [facility] [place], [place], [address] Phone: [phone]   Fax:  [phone]  Name: Mercedes Decker MRN: [id number] Date of Birth: 08/11/1941

## 2018-10-31 ENCOUNTER — Ambulatory Visit (INDEPENDENT_AMBULATORY_CARE_PROVIDER_SITE_OTHER): Payer: Medicare Other | Admitting: Rehabilitative and Restorative Service Providers"

## 2018-10-31 ENCOUNTER — Encounter: Payer: Self-pay | Admitting: Rehabilitative and Restorative Service Providers"

## 2018-10-31 DIAGNOSIS — M542 Cervicalgia: Secondary | ICD-10-CM

## 2018-10-31 DIAGNOSIS — R29898 Other symptoms and signs involving the musculoskeletal system: Secondary | ICD-10-CM

## 2018-10-31 DIAGNOSIS — R293 Abnormal posture: Secondary | ICD-10-CM

## 2018-10-31 NOTE — Patient Instructions (Signed)
Access Code: [email or web address]  URL: [email or web address]  Date: 10/31/2018  Prepared by: Gillermo Murdoch   Exercises  Seated Cervical Retraction - 10 reps - 1 sets - 3x daily - 7x weekly  Standing Scapular Retraction - 10 reps - 1 sets - 10 hold - 3x daily - 7x weekly  Shoulder External Rotation and Scapular Retraction - 10 reps - 1 sets - hold - 3x daily - 7x weekly  Standing Scapular Retraction in Abduction - 10 reps - 1 sets - 3x daily - 7x weekly  Doorway Pec Stretch at 60 Degrees Abduction - 3 reps - 1 sets - 3x daily - 7x weekly  Doorway Pec Stretch at 90 Degrees Abduction - 3 reps - 1 sets - 30seconds hold - 2x daily - 7x weekly  Doorway Pec Stretch at 120 Degrees Abduction - 3 reps - 1 sets - 30 second hold hold - 2x daily - 7x weekly   Today  Shoulder External Rotation and Scapular Retraction with Resistance - 10 reps - 2 sets - 1x daily - 7x weekly  Scapular Retraction with Resistance - 10 reps - 2 sets - 2x daily - 7x weekly  Scapular Retraction with Resistance Advanced - 10 reps - 2 sets - 2x daily - 7x weekly

## 2018-10-31 NOTE — Therapy (Signed)
[place], [place], [address] Phone: [phone]   Fax:  [phone]  Physical Therapy Treatment  Patient Details  Name: Mercedes Decker MRN: [id number] Date of Birth: 11-23-1940 Referring Provider (PT): Dr Lynne Leader    Encounter Date: 10/31/2018  PT End of Session - 10/31/18 0713    Visit Number  3    Number of Visits  12    Date for PT Re-Evaluation  12/05/18    PT Start Time  0714    PT Stop Time  0815    PT Time Calculation (min)  61 min    Activity Tolerance  Patient tolerated treatment well       Past Medical History:  Diagnosis Date  . Arthritis   . Hypertension   . Migraine   . Osteoporosis   . Prediabetes 08/28/2018    Past Surgical History:  Procedure Laterality Date  . ABDOMINAL HYSTERECTOMY    . BACK SURGERY    . BREAST SURGERY    . BUNIONECTOMY    . INCISIONAL HERNIA REPAIR    . KNEE ARTHROSCOPY      There were no vitals filed for this visit.  Subjective Assessment - 10/31/18 0717    Subjective  Some soreness from the DN - still sore in the neck but things are gradually improving. Still having some trouble sleeping but that is a bit better.     Currently in Pain?  No/denies    Pain Location  Neck    Pain Orientation  Right;Posterior    Pain Descriptors / Indicators  Sore    Pain Type  Acute pain    Pain Onset  More than a month ago    Pain Frequency  Intermittent                       [facility] Adult PT Treatment/Exercise - 10/31/18 0001      Shoulder Exercises: Standing   Extension  Strengthening;Both;10 reps;Theraband    Theraband Level (Shoulder Extension)  Level 1 (Yellow)    Row  Strengthening;Both;10 reps;Theraband    Theraband Level (Shoulder Row)  Level 1 (Yellow)    Retraction  Strengthening;Both;15 reps;Theraband    Theraband Level (Shoulder Retraction)  Level 1 (Yellow)    Other Standing Exercises  axial extension to pt tolerance 10  sec x 5; scap saueeze 10 sec x 10; L's x 10; W's x 5 with swim noodle       Shoulder Exercises: Stretch   Other Shoulder Stretches  3 position doorway stretch 30 sec x 2 reps each position     Other Shoulder Stretches  supine chin tuck 10 sec x 5 reps       Moist Heat Therapy   Number Minutes Moist Heat  20 Minutes    Moist Heat Location  Cervical;Shoulder      Electrical Stimulation   Electrical Stimulation Location  Rt cervical and upper traps    Electrical Stimulation Action  IFC    Electrical Stimulation Parameters  to tolerance    Electrical Stimulation Goals  Pain;Tone      Manual Therapy   Manual therapy comments  pt supine hooklying and prone     Joint Mobilization  CPA and lat mobs cervical spine Grade II/III    Soft tissue mobilization  soft tissue work through cervical and posterior shoulder girdle including upper traps/leveator: ant/lat/post cervical and thoracic paraspinals     Myofascial Release  anterior/posterior/lateral cervical musculature; upper traps into occipital area; cervical and thoracic paraspinals     Passive ROM  cervical flexion; flexion with slight rotation; lateral flexion in neutral     Manual Traction  cervical traction 1-2 reps for 15-20 sec              PT Education - 10/31/18 0724    Education Details  HEP     Person(s) Educated  Patient    Methods  Explanation;Demonstration;Tactile cues;Verbal cues;Handout    Comprehension  Verbalized understanding;Returned demonstration;Verbal cues required;Tactile cues required          PT Long Term Goals - 10/24/18 0929      PT LONG TERM GOAL #1   Title  Decrease pain in the cervical and shoulder girdle areas by 50-75% allowing patient to participate in all normal and functional activities 12/04/2018    Time  6    Period  Weeks    Status  New      PT LONG TERM GOAL #2   Title  Increase ROM in cervical spine and bilat shoulders to WFL's without pain 12/04/2018    Time  6    Period  Weeks     Status  New      PT LONG TERM GOAL #3   Title  Improve posture and alignment with patient to demonstrated engaged posterior shoulder girdle musculature 12/04/2018    Time  6    Period  Weeks    Status  New      PT LONG TERM GOAL #4   Title  Independent in HEP 12/04/2018    Time  6    Period  Weeks    Status  New      PT LONG TERM GOAL #5   Title  Improve FOTO to </= 38% limitation 12/04/2018    Time  6    Period  Weeks    Status  New            Plan - 10/31/18 0973    Clinical Impression Statement  Improving with decreased pain and palpable tightness Rt cervical and shoulder girdle area. Responding well to DN and manual work. Added psoterior shoulder girdle strengthening without difficulty. Patient continues to progress toward stated goals of therapy.     Rehab Potential  Good    PT Frequency  2x / week    PT Duration  6 weeks    PT Treatment/Interventions  Patient/family education;ADLs/Self Care Home Management;Cryotherapy;Electrical Stimulation;Iontophoresis 4mg /ml Dexamethasone;Moist Heat;Ultrasound;Therapeutic activities;Therapeutic exercise;Neuromuscular re-education;Dry needling;Passive range of motion;Manual techniques    PT Next Visit Plan  review HEP; progress with postural correction and education; manual work; continue DN; modalities as indicated     PT Home Exercise Plan  Access Code: [email or web address]     Consulted and Agree with Plan of Care  Patient       Patient will benefit from skilled therapeutic intervention in order to improve the following deficits and impairments:  Postural dysfunction, Improper body mechanics, Pain, Increased fascial restricitons, Increased muscle spasms, Hypomobility, Decreased mobility, Decreased range of motion, Decreased activity tolerance  Visit Diagnosis: Cervicalgia  Other symptoms and signs involving the musculoskeletal system  Abnormal posture     Problem List Patient Active Problem List   Diagnosis Date Noted  . Prediabetes  08/28/2018  . Fall 02/16/2018  . Primary osteoarthritis of both knees 11/21/2017  . Greater trochanteric bursitis of right hip 10/23/2017  . Pancreatic tumor 05/08/2017  . Hoarseness 02/10/2017  .  Right shoulder injury 08/18/2016  . TIA (transient ischemic attack) 03/08/2016  . Right hand pain 03/07/2016  . Patient has active power of attorney for health care 11/30/2015  . Essential hypertension 12/25/2014  . Murmur, cardiac 09/11/2012  . DJD (degenerative joint disease) 09/03/2012  . S/P hysterectomy 09/03/2012  . Osteoporosis  L fem neck  Dexa 07/2014  -3.1 09/03/2012  . Migraine 09/03/2012  . History of anemia 09/03/2012  . Mixed hyperlipidemia 09/03/2012  . GERD (gastroesophageal reflux disease) 09/03/2012    Ashaya Raftery Nilda Simmer PT, MPH  10/31/2018, 8:09 AM  [facility] [place], [place], [address] Phone: [phone]   Fax:  [phone]  Name: Mercedes Decker MRN: [id number] Date of Birth: [birth year]

## 2018-11-05 ENCOUNTER — Encounter: Payer: Self-pay | Admitting: Rehabilitative and Restorative Service Providers"

## 2018-11-05 ENCOUNTER — Ambulatory Visit (INDEPENDENT_AMBULATORY_CARE_PROVIDER_SITE_OTHER): Payer: Medicare Other | Admitting: Rehabilitative and Restorative Service Providers"

## 2018-11-05 DIAGNOSIS — M542 Cervicalgia: Secondary | ICD-10-CM

## 2018-11-05 DIAGNOSIS — R293 Abnormal posture: Secondary | ICD-10-CM

## 2018-11-05 DIAGNOSIS — R29898 Other symptoms and signs involving the musculoskeletal system: Secondary | ICD-10-CM

## 2018-11-05 NOTE — Therapy (Signed)
[place], [place], [address] Phone: [phone]   Fax:  [phone]  Physical Therapy Treatment  Patient Details  Name: Mercedes Decker MRN: [id number] Date of Birth: Jan 01, 1941 Referring Provider (PT): Dr Lynne Leader    Encounter Date: 11/05/2018  PT End of Session - 11/05/18 0712    Visit Number  4    Number of Visits  12    Date for PT Re-Evaluation  12/05/18    PT Start Time  0712    PT Stop Time  0811    PT Time Calculation (min)  59 min    Activity Tolerance  Patient tolerated treatment well       Past Medical History:  Diagnosis Date  . Arthritis   . Hypertension   . Migraine   . Osteoporosis   . Prediabetes 08/28/2018    Past Surgical History:  Procedure Laterality Date  . ABDOMINAL HYSTERECTOMY    . BACK SURGERY    . BREAST SURGERY    . BUNIONECTOMY    . INCISIONAL HERNIA REPAIR    . KNEE ARTHROSCOPY      There were no vitals filed for this visit.  Subjective Assessment - 11/05/18 0712    Subjective  Patient reports that she is feeling looser and having less pain. She worked in her yard yesterday. Not much soreness after the last treatment.     Currently in Pain?  No/denies         [facility] PT Assessment - 11/05/18 0001      Assessment   Medical Diagnosis  Cervical dysfunction    Referring Provider (PT)  Dr Lynne Leader     Onset Date/Surgical Date  10/15/18    Hand Dominance  Right    Next MD Visit  PRN     Prior Therapy  here for hip       AROM   Cervical Flexion  59    Cervical Extension  47    Cervical - Right Side Bend  30    Cervical - Left Side Bend  32    Cervical - Right Rotation  51    Cervical - Left Rotation  53                   [facility] Adult PT Treatment/Exercise - 11/05/18 0001      Therapeutic Activites    Therapeutic Activities  --   diaphragmatic breathing instruction      Shoulder Exercises: Standing   Other Standing Exercises   axial extension to pt tolerance 10 sec x 5; scap saueeze 10 sec x 10; L's x 10; W's x 5 with swim noodle     Other Standing Exercises  upper trap stretch - reach for the floor 10-15 sec hold x 5 reps       Shoulder Exercises: Stretch   Other Shoulder Stretches  3 position doorway stretch 30 sec x 2 reps each position     Other Shoulder Stretches  supine chin tuck 10 sec x 5 reps       Moist Heat Therapy   Number Minutes Moist Heat  20 Minutes    Moist Heat Location  Cervical;Shoulder      Electrical Stimulation   Electrical Stimulation Location  Rt cervical and upper traps    Electrical Stimulation Action  IFC    Electrical Stimulation Parameters  to tolerance    Electrical Stimulation Goals  Pain;Tone  Manual Therapy   Manual therapy comments  pt supine hooklying      Joint Mobilization  CPA and lat mobs cervical spine Grade II/III    Soft tissue mobilization  soft tissue work through cervical and posterior shoulder girdle including upper traps/leveator: ant/lat/post cervical and thoracic paraspinals     Myofascial Release  anterior/posterior/lateral cervical musculature; upper traps into occipital area; cervical and thoracic paraspinals     Passive ROM  cervical flexion; flexion with slight rotation; lateral flexion in neutral     Manual Traction  cervical traction 1-2 reps for 15-20 sec        Trigger Point Dry Needling - 11/05/18 0001    Consent Given?  Yes    Education Handout Provided  Yes    Upper Trapezius Response  Palpable increased muscle length;Twitch reponse elicited    Oblique Capitus Response  Palpable increased muscle length    Scalenes Response  Palpable increased muscle length                PT Long Term Goals - 10/24/18 0929      PT LONG TERM GOAL #1   Title  Decrease pain in the cervical and shoulder girdle areas by 50-75% allowing patient to participate in all normal and functional activities 12/04/2018    Time  6    Period  Weeks    Status   New      PT LONG TERM GOAL #2   Title  Increase ROM in cervical spine and bilat shoulders to WFL's without pain 12/04/2018    Time  6    Period  Weeks    Status  New      PT LONG TERM GOAL #3   Title  Improve posture and alignment with patient to demonstrated engaged posterior shoulder girdle musculature 12/04/2018    Time  6    Period  Weeks    Status  New      PT LONG TERM GOAL #4   Title  Independent in HEP 12/04/2018    Time  6    Period  Weeks    Status  New      PT LONG TERM GOAL #5   Title  Improve FOTO to </= 38% limitation 12/04/2018    Time  6    Period  Weeks    Status  New            Plan - 11/05/18 8675    Clinical Impression Statement  Continued improvement with increased ROM and decreased palpable tightness. patient has some persistent tightness in the Rt lateral cervical and upper trap. Progressing well toward stated goals of therapy.      Rehab Potential  Good    PT Frequency  2x / week    PT Duration  6 weeks    PT Treatment/Interventions  Patient/family education;ADLs/Self Care Home Management;Cryotherapy;Electrical Stimulation;Iontophoresis 4mg /ml Dexamethasone;Moist Heat;Ultrasound;Therapeutic activities;Therapeutic exercise;Neuromuscular re-education;Dry needling;Passive range of motion;Manual techniques    PT Next Visit Plan  review HEP; progress with postural correction and education; manual work; continue DN; modalities as indicated     PT Home Exercise Plan  Access Code: [email or web address]     Consulted and Agree with Plan of Care  Patient       Patient will benefit from skilled therapeutic intervention in order to improve the following deficits and impairments:  Postural dysfunction, Improper body mechanics, Pain, Increased fascial restricitons, Increased muscle spasms, Hypomobility, Decreased mobility, Decreased range of motion, Decreased activity tolerance  Visit Diagnosis: Cervicalgia  Other symptoms and signs involving the musculoskeletal  system  Abnormal posture     Problem List Patient Active Problem List   Diagnosis Date Noted  . Prediabetes 08/28/2018  . Fall 02/16/2018  . Primary osteoarthritis of both knees 11/21/2017  . Greater trochanteric bursitis of right hip 10/23/2017  . Pancreatic tumor 05/08/2017  . Hoarseness 02/10/2017  . Right shoulder injury 08/18/2016  . TIA (transient ischemic attack) 03/08/2016  . Right hand pain 03/07/2016  . Patient has active power of attorney for health care 11/30/2015  . Essential hypertension 12/25/2014  . Murmur, cardiac 09/11/2012  . DJD (degenerative joint disease) 09/03/2012  . S/P hysterectomy 09/03/2012  . Osteoporosis  L fem neck  Dexa 07/2014  -3.1 09/03/2012  . Migraine 09/03/2012  . History of anemia 09/03/2012  . Mixed hyperlipidemia 09/03/2012  . GERD (gastroesophageal reflux disease) 09/03/2012    Yashas Camilli Nilda Simmer PT, MPH  11/05/2018, 8:21 AM  [facility] [place], [place], [address] Phone: [phone]   Fax:  [phone]  Name: Mercedes Decker MRN: [id number] Date of Birth: 1940/10/10

## 2018-11-05 NOTE — Patient Instructions (Addendum)
Standing tall - tuck chin with shoulder blades down and back. Reach for the floor to feel a gentle stretch through the upper shoulder area  Hold 10-15 sec relax and repeat ~ 5 times   Trigger Point Dry Needling  . What is Trigger Point Dry Needling (DN)? o DN is a physical therapy technique used to treat muscle pain and dysfunction. Specifically, DN helps deactivate muscle trigger points (muscle knots).  o A thin filiform needle is used to penetrate the skin and stimulate the underlying trigger point. The goal is for a local twitch response (LTR) to occur and for the trigger point to relax. No medication of any kind is injected during the procedure.   . What Does Trigger Point Dry Needling Feel Like?  o The procedure feels different for each individual patient. Some patients report that they do not actually feel the needle enter the skin and overall the process is not painful. Very mild bleeding may occur. However, many patients feel a deep cramping in the muscle in which the needle was inserted. This is the local twitch response.   Marland Kitchen How Will I feel after the treatment? o Soreness is normal, and the onset of soreness may not occur for a few hours. Typically this soreness does not last longer than two days.  o Bruising is uncommon, however; ice can be used to decrease any possible bruising.  o In rare cases feeling tired or nauseous after the treatment is normal. In addition, your symptoms may get worse before they get better, this period will typically not last longer than 24 hours.   . What Can I do After My Treatment? o Increase your hydration by drinking more water for the next 24 hours. o You may place ice or heat on the areas treated that have become sore, however, do not use heat on inflamed or bruised areas. Heat often brings more relief post needling. o You can continue your regular activities, but vigorous activity is not recommended initially after the treatment for 24 hours. o DN is  best combined with other physical therapy such as strengthening, stretching, and other therapies.

## 2018-11-07 ENCOUNTER — Other Ambulatory Visit: Payer: Self-pay

## 2018-11-07 ENCOUNTER — Ambulatory Visit (INDEPENDENT_AMBULATORY_CARE_PROVIDER_SITE_OTHER): Payer: Medicare Other | Admitting: Rehabilitative and Restorative Service Providers"

## 2018-11-07 DIAGNOSIS — M542 Cervicalgia: Secondary | ICD-10-CM | POA: Diagnosis not present

## 2018-11-07 DIAGNOSIS — R293 Abnormal posture: Secondary | ICD-10-CM | POA: Diagnosis not present

## 2018-11-07 DIAGNOSIS — R29898 Other symptoms and signs involving the musculoskeletal system: Secondary | ICD-10-CM

## 2018-11-07 NOTE — Patient Instructions (Signed)
Access Code: [email or web address]  URL: [email or web address]  Date: 11/07/2018  Prepared by: Gillermo Murdoch   Exercises  Seated Cervical Retraction - 10 reps - 1 sets - 3x daily - 7x weekly  Standing Scapular Retraction - 10 reps - 1 sets - 10 hold - 3x daily - 7x weekly  Shoulder External Rotation and Scapular Retraction - 10 reps - 1 sets - hold - 3x daily - 7x weekly  Standing Scapular Retraction in Abduction - 10 reps - 1 sets - 3x daily - 7x weekly  Doorway Pec Stretch at 60 Degrees Abduction - 3 reps - 1 sets - 3x daily - 7x weekly  Doorway Pec Stretch at 90 Degrees Abduction - 3 reps - 1 sets - 30seconds hold - 2x daily - 7x weekly  Doorway Pec Stretch at 120 Degrees Abduction - 3 reps - 1 sets - 30 second hold hold - 2x daily - 7x weekly  Shoulder External Rotation and Scapular Retraction with Resistance - 10 reps - 2 sets - 1x daily - 7x weekly  Scapular Retraction with Resistance - 10 reps - 2 sets - 2x daily - 7x weekly  Scapular Retraction with Resistance Advanced - 10 reps - 2 sets - 2x daily - 7x weekly   Today  Neck Sidebending - 3 reps - 1 sets - 10sec hold - 2x daily - 7x weekly  Neck Rotation - 3 reps - 1 sets - 10sec hold - 2x daily - 7x weekly  Patient Education  Sleep Hygiene

## 2018-11-07 NOTE — Therapy (Signed)
[place], [place], [address] Phone: [phone]   Fax:  [phone]  Physical Therapy Treatment  Patient Details  Name: Mercedes Decker MRN: [id number] Date of Birth: 05/23/1941 Referring Provider (PT): Dr Lynne Leader    Encounter Date: 11/07/2018  PT End of Session - 11/07/18 0711    Visit Number  5    Number of Visits  12    Date for PT Re-Evaluation  12/05/18    PT Start Time  0711    PT Stop Time  0814    PT Time Calculation (min)  63 min    Activity Tolerance  Patient tolerated treatment well       Past Medical History:  Diagnosis Date  . Arthritis   . Hypertension   . Migraine   . Osteoporosis   . Prediabetes 08/28/2018    Past Surgical History:  Procedure Laterality Date  . ABDOMINAL HYSTERECTOMY    . BACK SURGERY    . BREAST SURGERY    . BUNIONECTOMY    . INCISIONAL HERNIA REPAIR    . KNEE ARTHROSCOPY      There were no vitals filed for this visit.  Subjective Assessment - 11/07/18 9211    Subjective  [name] reports that she is "tender' and sore from the DN. No pain but just tender. Still improving.     Currently in Pain?  No/denies                       [facility] Adult PT Treatment/Exercise - 11/07/18 0001      Self-Care   Self-Care  --   sleep handout      Shoulder Exercises: ROM/Strengthening   UBE (Upper Arm Bike)  L2 x 4 min 2 min fwd/2 min back       Shoulder Exercises: Stretch   Other Shoulder Stretches  3 position doorway stretch 30 sec x 2 reps each position     Other Shoulder Stretches  supine chin tuck 10 sec x 5 reps       Moist Heat Therapy   Number Minutes Moist Heat  20 Minutes    Moist Heat Location  Cervical;Shoulder      Electrical Stimulation   Electrical Stimulation Location  Rt cervical and upper traps    Electrical Stimulation Action  IFC    Electrical Stimulation Parameters  to tolerance    Electrical Stimulation Goals   Pain;Tone      Manual Therapy   Manual therapy comments  pt prone and supine hooklying      Joint Mobilization  CPA and lat mobs cervical spine Grade II/III    Soft tissue mobilization  soft tissue work through cervical and posterior shoulder girdle including upper traps/leveator: ant/lat/post cervical and thoracic paraspinals     Myofascial Release  anterior/posterior/lateral cervical musculature; upper traps into occipital area; cervical and thoracic paraspinals     Passive ROM  cervical flexion; flexion with slight rotation; lateral flexion in neutral     Manual Traction  cervical traction 1-2 reps for 15-20 sec       Neck Exercises: Stretches   Other Neck Stretches  lateral cervical flexion in sitting 10 sec x 3 reps each side     Other Neck Stretches  cervical rotation in sitting 5 sec x 3 reps each side        Trigger Point Dry Needling - 11/07/18 0001    Consent Given?  Yes    Other Dry Needling  Rt side     Upper Trapezius Response  Palpable increased muscle length    Levator Scapulae Response  Palpable increased muscle length           PT Education - 11/07/18 0724    Education Details  HEP   (Pended)     Person(s) Educated  Patient  (Pended)     Methods  Explanation;Demonstration;Tactile cues;Verbal cues;Handout  (Pended)     Comprehension  Verbalized understanding;Returned demonstration;Verbal cues required  (Pended)           PT Long Term Goals - 10/24/18 0929      PT LONG TERM GOAL #1   Title  Decrease pain in the cervical and shoulder girdle areas by 50-75% allowing patient to participate in all normal and functional activities 12/04/2018    Time  6    Period  Weeks    Status  New      PT LONG TERM GOAL #2   Title  Increase ROM in cervical spine and bilat shoulders to WFL's without pain 12/04/2018    Time  6    Period  Weeks    Status  New      PT LONG TERM GOAL #3   Title  Improve posture and alignment with patient to demonstrated engaged posterior  shoulder girdle musculature 12/04/2018    Time  6    Period  Weeks    Status  New      PT LONG TERM GOAL #4   Title  Independent in HEP 12/04/2018    Time  6    Period  Weeks    Status  New      PT LONG TERM GOAL #5   Title  Improve FOTO to </= 38% limitation 12/04/2018    Time  6    Period  Weeks    Status  New            Plan - 11/07/18 0711    Clinical Impression Statement  Persistent tightness noted through the Rt lateral cervical musculature and into the leveator/upper trap area. Excellent response to DN and manual work in this area. Added UBE and exercises without difficulty. Less soreness and tightnes reported following treatment.     Rehab Potential  Good    PT Frequency  2x / week    PT Duration  6 weeks    PT Treatment/Interventions  Patient/family education;ADLs/Self Care Home Management;Cryotherapy;Electrical Stimulation;Iontophoresis 4mg /ml Dexamethasone;Moist Heat;Ultrasound;Therapeutic activities;Therapeutic exercise;Neuromuscular re-education;Dry needling;Passive range of motion;Manual techniques    PT Next Visit Plan  review HEP; progress with postural correction and education; manual work; continue DN; modalities as indicated     PT Home Exercise Plan  Access Code: [email or web address]     Consulted and Agree with Plan of Care  Patient       Patient will benefit from skilled therapeutic intervention in order to improve the following deficits and impairments:  Postural dysfunction, Improper body mechanics, Pain, Increased fascial restricitons, Increased muscle spasms, Hypomobility, Decreased mobility, Decreased range of motion, Decreased activity tolerance  Visit Diagnosis: Cervicalgia  Other symptoms and signs involving the musculoskeletal system  Abnormal posture     Problem List Patient Active Problem List   Diagnosis Date Noted  . Prediabetes 08/28/2018  . Fall 02/16/2018  . Primary osteoarthritis of both knees 11/21/2017  . Greater trochanteric bursitis of  right hip 10/23/2017  . Pancreatic tumor 05/08/2017  . Hoarseness 02/10/2017  . Right  shoulder injury 08/18/2016  . TIA (transient ischemic attack) 03/08/2016  . Right hand pain 03/07/2016  . Patient has active power of attorney for health care 11/30/2015  . Essential hypertension 12/25/2014  . Murmur, cardiac 09/11/2012  . DJD (degenerative joint disease) 09/03/2012  . S/P hysterectomy 09/03/2012  . Osteoporosis  L fem neck  Dexa 07/2014  -3.1 09/03/2012  . Migraine 09/03/2012  . History of anemia 09/03/2012  . Mixed hyperlipidemia 09/03/2012  . GERD (gastroesophageal reflux disease) 09/03/2012    Samari Gorby Nilda Simmer PT, MPH  11/07/2018, 8:00 AM  [facility] [place], [place], [address] Phone: [phone]   Fax:  [phone]  Name: JASMAIN AHLBERG MRN: [id number] Date of Birth: [birth year]

## 2018-11-12 ENCOUNTER — Ambulatory Visit (INDEPENDENT_AMBULATORY_CARE_PROVIDER_SITE_OTHER): Payer: Medicare Other | Admitting: Physical Therapy

## 2018-11-12 ENCOUNTER — Other Ambulatory Visit: Payer: Self-pay

## 2018-11-12 DIAGNOSIS — M542 Cervicalgia: Secondary | ICD-10-CM | POA: Diagnosis not present

## 2018-11-12 DIAGNOSIS — R29898 Other symptoms and signs involving the musculoskeletal system: Secondary | ICD-10-CM | POA: Diagnosis not present

## 2018-11-12 DIAGNOSIS — R293 Abnormal posture: Secondary | ICD-10-CM | POA: Diagnosis not present

## 2018-11-12 NOTE — Therapy (Signed)
[place], [place], [address] Phone: [phone]   Fax:  [phone]  Physical Therapy Treatment  Patient Details  Name: Mercedes Decker MRN: [id number] Date of Birth: 1940/11/19 Referring Provider (PT): Dr Lynne Leader    Encounter Date: 11/12/2018  PT End of Session - 11/12/18 0720    Visit Number  6    Number of Visits  12    Date for PT Re-Evaluation  12/05/18    PT Start Time  0715    PT Stop Time  0758    PT Time Calculation (min)  43 min    Activity Tolerance  Patient tolerated treatment well    Behavior During Therapy  [facility] for tasks assessed/performed       Past Medical History:  Diagnosis Date  . Arthritis   . Hypertension   . Migraine   . Osteoporosis   . Prediabetes 08/28/2018    Past Surgical History:  Procedure Laterality Date  . ABDOMINAL HYSTERECTOMY    . BACK SURGERY    . BREAST SURGERY    . BUNIONECTOMY    . INCISIONAL HERNIA REPAIR    . KNEE ARTHROSCOPY      There were no vitals filed for this visit.  Subjective Assessment - 11/12/18 0720    Subjective  Pt reports she hasn't had "pain-pain" since last visit. She complains of continued tightness in Rt side of neck and pain with certain motions.     Currently in Pain?  No/denies    Pain Score  0-No pain         [facility] PT Assessment - 11/12/18 0001      Assessment   Medical Diagnosis  Cervical dysfunction    Referring Provider (PT)  Dr Lynne Leader     Onset Date/Surgical Date  10/15/18    Hand Dominance  Right    Next MD Visit  PRN     Prior Therapy  here for hip       AROM   Cervical Flexion  58    Cervical Extension  45    Cervical - Right Side Bend  30   with pain   Cervical - Left Side Bend  45    Cervical - Right Rotation  70    Cervical - Left Rotation  70       [facility] Adult PT Treatment/Exercise - 11/12/18 0001      Shoulder Exercises: Prone   Retraction  Both;10 reps   with axial ext   Other Prone Exercises  goal post arms with scap retraction and axial ext x 10      Shoulder Exercises: ROM/Strengthening   UBE (Upper Arm Bike)  L2 x 4 min 2 min fwd/2 min back       Shoulder Exercises: Stretch   Other Shoulder Stretches  3 position doorway stretch 30 sec x 2 reps each position       Modalities   Modalities  Electrical Stimulation;Ultrasound      Electrical Stimulation   Electrical Stimulation Location  Rt upper trap/scalenes/ levator    Electrical Stimulation Action  combo [place]    Electrical Stimulation Parameters  intensity to tolerance x 8 min total    Electrical Stimulation Goals  Pain;Tone      Ultrasound   Ultrasound Location  see estim    Ultrasound Parameters  combo [place]: 100%, 1.2 w/cm2, 8 min     Ultrasound Goals  Pain  Manual Therapy   Soft tissue mobilization  IASTM to Rt upper trap, scalene, levator, upper thoracic paraspinals to decrease fascial restriction and improve ROM      Neck Exercises: Stretches   Levator Stretch  Right;2 reps;30 seconds    Other Neck Stretches  lateral cervical flexion in sitting 10 sec x 3 reps each side     Other Neck Stretches  cervical rotation in sitting 5 sec x 3 reps each side                   PT Long Term Goals - 11/12/18 0723      PT LONG TERM GOAL #1   Title  Decrease pain in the cervical and shoulder girdle areas by 50-75% allowing patient to participate in all normal and functional activities 12/04/2018    Baseline  11/12/18: 80% less pain     Time  6    Period  Weeks    Status  Achieved      PT LONG TERM GOAL #2   Title  Increase ROM in cervical spine and bilat shoulders to WFL's without pain 12/04/2018    Time  6    Period  Weeks    Status  On-going      PT LONG TERM GOAL #3   Title  Improve posture and alignment with patient to demonstrated engaged posterior shoulder girdle musculature 12/04/2018    Time  6    Period  Weeks    Status  On-going      PT LONG TERM GOAL #4   Title   Independent in HEP 12/04/2018    Time  6    Period  Weeks    Status  On-going      PT LONG TERM GOAL #5   Title  Improve FOTO to </= 38% limitation 12/04/2018    Time  6    Period  Weeks    Status  On-going            Plan - 11/12/18 0802    Clinical Impression Statement  Improved cervical ROM.  Persistant tightness in Rt levator and upper trap.  Pt has met LTG #1 and partially met LTG#2.  Pt reported reduction of tightness in Rt neck with cervical rotation at end of session.     Rehab Potential  Good    PT Frequency  2x / week    PT Duration  6 weeks    PT Treatment/Interventions  Patient/family education;ADLs/Self Care Home Management;Cryotherapy;Electrical Stimulation;Iontophoresis 26m/ml Dexamethasone;Moist Heat;Ultrasound;Therapeutic activities;Therapeutic exercise;Neuromuscular re-education;Dry needling;Passive range of motion;Manual techniques    PT Next Visit Plan  review HEP; progress with postural correction and education; manual work; continue DN; modalities as indicated     PT Home Exercise Plan  Access Code: [email or web address]     Consulted and Agree with Plan of Care  Patient       Patient will benefit from skilled therapeutic intervention in order to improve the following deficits and impairments:  Postural dysfunction, Improper body mechanics, Pain, Increased fascial restricitons, Increased muscle spasms, Hypomobility, Decreased mobility, Decreased range of motion, Decreased activity tolerance  Visit Diagnosis: Cervicalgia  Other symptoms and signs involving the musculoskeletal system  Abnormal posture     Problem List Patient Active Problem List   Diagnosis Date Noted  . Prediabetes 08/28/2018  . Fall 02/16/2018  . Primary osteoarthritis of both knees 11/21/2017  . Greater trochanteric bursitis of right hip 10/23/2017  . Pancreatic tumor 05/08/2017  . Hoarseness  02/10/2017  . Right shoulder injury 08/18/2016  . TIA (transient ischemic attack) 03/08/2016  .  Right hand pain 03/07/2016  . Patient has active power of attorney for health care 11/30/2015  . Essential hypertension 12/25/2014  . Murmur, cardiac 09/11/2012  . DJD (degenerative joint disease) 09/03/2012  . S/P hysterectomy 09/03/2012  . Osteoporosis  L fem neck  Dexa 07/2014  -3.1 09/03/2012  . Migraine 09/03/2012  . History of anemia 09/03/2012  . Mixed hyperlipidemia 09/03/2012  . GERD (gastroesophageal reflux disease) 09/03/2012   Kerin Perna, PTA 11/12/18 12:23 PM  [place], [place], [address] Phone: [phone]   Fax:  [phone]  Name: Mercedes Decker MRN: [id number] Date of Birth: Mar 11, 1941

## 2018-11-13 ENCOUNTER — Ambulatory Visit: Payer: Medicare Other | Admitting: Osteopathic Medicine

## 2018-11-14 ENCOUNTER — Ambulatory Visit (INDEPENDENT_AMBULATORY_CARE_PROVIDER_SITE_OTHER): Payer: Medicare Other | Admitting: Rehabilitative and Restorative Service Providers"

## 2018-11-14 ENCOUNTER — Other Ambulatory Visit: Payer: Self-pay

## 2018-11-14 ENCOUNTER — Encounter: Payer: Self-pay | Admitting: Rehabilitative and Restorative Service Providers"

## 2018-11-14 DIAGNOSIS — M542 Cervicalgia: Secondary | ICD-10-CM

## 2018-11-14 DIAGNOSIS — R29898 Other symptoms and signs involving the musculoskeletal system: Secondary | ICD-10-CM | POA: Diagnosis not present

## 2018-11-14 DIAGNOSIS — R293 Abnormal posture: Secondary | ICD-10-CM | POA: Diagnosis not present

## 2018-11-14 NOTE — Therapy (Addendum)
[place], [place], [address] Phone: [phone]   Fax:  [phone]  Physical Therapy Treatment  Patient Details  Name: Mercedes Decker MRN: [id number] Date of Birth: 1941/03/26 Referring Provider (PT): Dr Lynne Leader    Encounter Date: 11/14/2018  PT End of Session - 11/14/18 0713    Visit Number  7    Number of Visits  12    Date for PT Re-Evaluation  12/05/18    PT Start Time  0713    PT Stop Time  0812    PT Time Calculation (min)  59 min    Activity Tolerance  Patient tolerated treatment well       Past Medical History:  Diagnosis Date  . Arthritis   . Hypertension   . Migraine   . Osteoporosis   . Prediabetes 08/28/2018    Past Surgical History:  Procedure Laterality Date  . ABDOMINAL HYSTERECTOMY    . BACK SURGERY    . BREAST SURGERY    . BUNIONECTOMY    . INCISIONAL HERNIA REPAIR    . KNEE ARTHROSCOPY      There were no vitals filed for this visit.  Subjective Assessment - 11/14/18 0713    Subjective  Patient reports that she continues to improve. She has no pain this morning just some stiffness. Feeling better.     Currently in Pain?  No/denies         [facility] PT Assessment - 11/14/18 0001      Assessment   Medical Diagnosis  Cervical dysfunction    Referring Provider (PT)  Dr Lynne Leader     Onset Date/Surgical Date  10/15/18    Hand Dominance  Right    Next MD Visit  PRN     Prior Therapy  here for hip       Observation/Other Assessments   Focus on Therapeutic Outcomes (FOTO)   38% limitation       AROM   Cervical Flexion  58    Cervical Extension  45    Cervical - Right Side Bend  30   with pain   Cervical - Left Side Bend  45    Cervical - Right Rotation  70    Cervical - Left Rotation  70      Palpation   Palpation comment  miminal muscular tightness through the ant/lat/post cervical musculature; upper traps; leveator; pecs; teres Rt > Lt                     [facility] Adult PT Treatment/Exercise - 11/14/18 0001      Shoulder Exercises: ROM/Strengthening   UBE (Upper Arm Bike)  L3 x 4 min 2 min fwd/2 min back       Shoulder Exercises: Stretch   Other Shoulder Stretches  3 position doorway stretch 30 sec x 2 reps each position       Electrical Stimulation   Electrical Stimulation Location  Rt upper trap/leveator    Electrical Stimulation Action  IFC    Electrical Stimulation Parameters  to tolerance    Electrical Stimulation Goals  Pain;Tone      Manual Therapy   Manual therapy comments  pt prone and supine hooklying      Joint Mobilization  CPA and lat mobs cervical and thoracic spine Grade II/III    Soft tissue mobilization  soft tissue work through Rt upper trap, scalene, levator, upper thoracic paraspinals to decrease  fascial restriction and improve ROM    Myofascial Release  posterior/lateral cervical musculature; upper traps into occipital area; cervical and thoracic paraspinals     Passive ROM  cervical flexion; flexion with slight rotation; lateral flexion in neutral     Manual Traction  cervical traction 1-2 reps for 15-20 sec        Trigger Point Dry Needling - 11/14/18 0001    Consent Given?  Yes    Dry Needling Comments  cervical paraspinals     Other Dry Needling  Rt side     Upper Trapezius Response  Palpable increased muscle length    Levator Scapulae Response  Palpable increased muscle length                PT Long Term Goals - 11/12/18 0723      PT LONG TERM GOAL #1   Title  Decrease pain in the cervical and shoulder girdle areas by 50-75% allowing patient to participate in all normal and functional activities 12/04/2018    Baseline  11/12/18: 80% less pain     Time  6    Period  Weeks    Status  Achieved      PT LONG TERM GOAL #2   Title  Increase ROM in cervical spine and bilat shoulders to WFL's without pain 12/04/2018    Time  6    Period  Weeks    Status  On-going      PT  LONG TERM GOAL #3   Title  Improve posture and alignment with patient to demonstrated engaged posterior shoulder girdle musculature 12/04/2018    Time  6    Period  Weeks    Status  On-going      PT LONG TERM GOAL #4   Title  Independent in HEP 12/04/2018    Time  6    Period  Weeks    Status  On-going      PT LONG TERM GOAL #5   Title  Improve FOTO to </= 38% limitation 12/04/2018    Time  6    Period  Weeks    Status  On-going            Plan - 11/14/18 4696    Clinical Impression Statement  Continued improvement with decreased tightness through the Rt cervical and shoulder girdle area. progressing well toward goals of rehab. Will continue with independent management of symptoms at home and call with any questions or problems.    Rehab Potential  Good    PT Frequency  2x / week    PT Duration  6 weeks    PT Treatment/Interventions  Patient/family education;ADLs/Self Care Home Management;Cryotherapy;Electrical Stimulation;Iontophoresis '4mg'$ /ml Dexamethasone;Moist Heat;Ultrasound;Therapeutic activities;Therapeutic exercise;Neuromuscular re-education;Dry needling;Passive range of motion;Manual techniques    PT Next Visit Plan  review HEP; progress with postural correction and education; manual work; continue DN; modalities as indicated - hold x 4 weeks will call to schedule as needed     PT Home Exercise Plan  Access Code: [email or web address]     Consulted and Agree with Plan of Care  Patient       Patient will benefit from skilled therapeutic intervention in order to improve the following deficits and impairments:  Postural dysfunction, Improper body mechanics, Pain, Increased fascial restricitons, Increased muscle spasms, Hypomobility, Decreased mobility, Decreased range of motion, Decreased activity tolerance  Visit Diagnosis: Cervicalgia  Other symptoms and signs involving the musculoskeletal system  Abnormal posture     Problem List  Patient Active Problem List   Diagnosis Date  Noted  . Prediabetes 08/28/2018  . Fall 02/16/2018  . Primary osteoarthritis of both knees 11/21/2017  . Greater trochanteric bursitis of right hip 10/23/2017  . Pancreatic tumor 05/08/2017  . Hoarseness 02/10/2017  . Right shoulder injury 08/18/2016  . TIA (transient ischemic attack) 03/08/2016  . Right hand pain 03/07/2016  . Patient has active power of attorney for health care 11/30/2015  . Essential hypertension 12/25/2014  . Murmur, cardiac 09/11/2012  . DJD (degenerative joint disease) 09/03/2012  . S/P hysterectomy 09/03/2012  . Osteoporosis  L fem neck  Dexa 07/2014  -3.1 09/03/2012  . Migraine 09/03/2012  . History of anemia 09/03/2012  . Mixed hyperlipidemia 09/03/2012  . GERD (gastroesophageal reflux disease) 09/03/2012    Daimion Adamcik Nilda Simmer PT, MPH  11/14/2018, 8:05 AM  [facility] [place], [place], [address] Phone: [phone]   Fax:  [phone]  Name: DEMIKA LANGENDERFER MRN: [id number] Date of Birth: 06/15/1941  PHYSICAL THERAPY DISCHARGE SUMMARY  Visits from Start of Care: 7  Current functional level related to goals / functional outcomes: See last progress note for discharge status    Remaining deficits: No known deficits    Education / Equipment: HEP  Plan: Patient agrees to discharge.  Patient goals were met. Patient is being discharged due to meeting the stated rehab goals.  ?????    Lonnie Reth P. Helene Kelp PT, MPH 12/19/18 9:02 AM

## 2018-11-29 ENCOUNTER — Encounter: Payer: Self-pay | Admitting: Osteopathic Medicine

## 2018-12-02 NOTE — Progress Notes (Addendum)
Subjective:   Mercedes Decker is a 78 y.o. female who presents for Medicare Annual (Subsequent) preventive examination.  Review of Systems:  No ROS.  Medicare Wellness Visit. Additional risk factors are reflected in the social history.  Cardiac Risk Factors include: advanced age (>34men, >49 women);dyslipidemia;hypertension Sleep patterns: Getting 4-5 hours of sleep a night. Wakes 2 timers to use the bathroom. Feels rested upon awakening Home Safety/Smoke Alarms: Feels safe in home. Smoke alarms in place.  Living environment; Lives husband with 1 story. No steps in the home. Shower is a walk in shower and no grab bars in place    Female:   Pap- aged out      [place]-   addressed    Dexa scan- per pt. Last year and normal      CCS- aged out     Objective:     Vitals: BP (!) 143/78   Pulse 74   Temp 97.7 F (36.5 C) (Oral)   Ht 4\' 10"  (1.473 m)   Wt 108 lb (49 kg)   BMI 22.57 kg/m   Body mass index is 22.57 kg/m.  Advanced Directives 12/03/2018 10/24/2018 07/10/2018  Does Patient Have a Medical Advance Directive? Yes Yes Yes  Type of Paramedic of [place];Living will [place];Living will [place];Living will  Does patient want to make changes to medical advance directive? No - Patient declined - -  Copy of [place] in Chart? Yes - validated most recent copy scanned in chart (See row information) No - copy requested No - copy requested    Tobacco Social History   Tobacco Use  Smoking Status Never Smoker  Smokeless Tobacco Never Used     Counseling given: Not Answered   Clinical Intake:  Pre-visit preparation completed: Yes  Pain : No/denies pain     Nutritional Risks: None Diabetes: No  How often do you need to have someone help you when you read instructions, pamphlets, or other written materials from your doctor or pharmacy?: 1 - Never What is the last grade level you  completed in school?: 12  Interpreter Needed?: No  Information entered by :: Orlie Dakin, LPN  Past Medical History:  Diagnosis Date  . Arthritis   . Hypertension   . Migraine   . Osteoporosis   . Prediabetes 08/28/2018   Past Surgical History:  Procedure Laterality Date  . ABDOMINAL HYSTERECTOMY    . BACK SURGERY    . BREAST SURGERY    . BUNIONECTOMY    . INCISIONAL HERNIA REPAIR    . KNEE ARTHROSCOPY     Family History  Problem Relation Age of Onset  . Alzheimer's disease Mother   . Heart disease Father   . Cancer Sister   . Diabetes Sister   . Heart disease Brother   . Diabetes Brother   . Cancer Brother    Social History   Socioeconomic History  . Marital status: Married    Spouse name: Purcell Nails  . Number of children: 1  . Years of education: 25  . Highest education level: 12th grade  Occupational History  . 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 Financial resource strain: Not hard at all  . Food insecurity:    Worry: Never true    Inability: Never true  . Transportation needs:    Medical: No    Non-medical: No  Tobacco Use  . Smoking  status: Never Smoker  . Smokeless tobacco: Never Used  Substance and Sexual Activity  . Alcohol use: No  . Drug use: No  . Sexual activity: Yes    Birth control/protection: Post-menopausal  Lifestyle  . Physical activity:    Days per week: 7 days    Minutes per session: 20 min  . Stress: Not at all  Relationships  . Social connections:    Talks on phone: More than three times a week    Gets together: Twice a week    Attends religious service: More than 4 times per year    Active member of club or organization: No    Attends meetings of clubs or organizations: Never    Relationship status: Married  Other Topics Concern  . Not on file  Social History Narrative   Retired. Works around [employer]. Walks daily. No caffeine    Outpatient Encounter Medications as of 12/03/2018   Medication Sig  . acetaminophen (TYLENOL) 650 MG CR tablet Take 650 mg by mouth every 8 (eight) hours as needed for pain.  Marland Kitchen amLODipine (NORVASC) 5 MG tablet Take 1 tablet (5 mg total) by mouth daily. Pt needs f/u appt w/PCP for further refills.  Marland Kitchen aspirin EC 81 MG tablet Take 1 tablet (81 mg total) by mouth daily.  Marland Kitchen CALCIUM-MAGNESIUM-ZINC PO Take by mouth.  . celecoxib (CELEBREX) 200 MG capsule One to 2 tablets by mouth daily as needed for pain.  . cholecalciferol (VITAMIN D) 1000 UNITS tablet Take 2,000 Units by mouth daily.  . Cyanocobalamin (VITAMIN B 12 PO) Take by mouth.  . denosumab (PROLIA) 60 MG/ML SOLN injection Inject 60 mg into the skin every 6 (six) months. Administer in upper arm, thigh, or abdomen  . magnesium gluconate (MAGONATE) 30 MG tablet Take 30 mg by mouth 2 (two) times daily.  . metoprolol succinate (TOPROL-XL) 50 MG 24 hr tablet Take 1 tablet (50 mg total) by mouth daily. Take with or immediately following a meal.  . SUMAtriptan 6 MG/0.5ML SOAJ Inject 1 Syringe into the skin once. Inject 6 mg into skin at onset of headache one time  . vitamin C (ASCORBIC ACID) 500 MG tablet Take 500 mg by mouth daily.  . vitamin E (VITAMIN E) 400 UNIT capsule Take 400 Units by mouth daily.  . cyclobenzaprine (FLEXERIL) 5 MG tablet Take 1 tablet (5 mg total) by mouth at bedtime as needed for muscle spasms. (Patient not taking: Reported on 12/03/2018)  . triamcinolone cream (KENALOG) 0.1 % Apply 1 application topically 2 (two) times daily.   No facility-administered encounter medications on file as of 12/03/2018.     Activities of Daily Living In your present state of health, do you have any difficulty performing the following activities: 12/03/2018  Hearing? Y  Comment hearing aids bilaterally  Vision? Y  Comment right eye- culture done on right eye from mole- normal  Difficulty concentrating or making decisions? N  Walking or climbing stairs? N  Dressing or bathing? N  Doing errands,  shopping? N  Preparing Food and eating ? N  Using the Toilet? N  In the past six months, have you accidently leaked urine? Y  Comment wears panti liner as needed  Do you have problems with loss of bowel control? N  Managing your Medications? N  Managing your Finances? N  Housekeeping or managing your Housekeeping? N  Some recent data might be hidden    Patient Care Team: Emeterio Reeve, DO as PCP - General (  Osteopathic Medicine)    Assessment:   This is a routine wellness examination for [place].Physical assessment deferred to PCP.   Exercise Activities and Dietary recommendations Current Exercise Habits: Home exercise routine, Type of exercise: walking, Time (Minutes): 20, Frequency (Times/Week): 7, Weekly Exercise (Minutes/Week): 140, Intensity: Mild, Exercise limited by: None identified Diet Healthy diet. Eats vegetables and proteins not much fruit Breakfast: yogurt or toast Lunch: yogurt and 1 cookike Dinner:  Meat, vefgetable and potato   Drinks water daily.   Goals    . Weight (lb) < 200 lb (90.7 kg)     Keep the weight she is at. Start doing some exercising of strength training       Fall Risk Fall Risk  12/03/2018 05/08/2017 03/20/2017 06/25/2014 09/03/2012  Falls in the past year? 1 No No No No  Comment - - Emmi Telephone Survey: data to providers prior to load - -  Number falls in past yr: 1 - - - -  Injury with Fall? 1 - - - -  Comment broke wrist - - - -  Risk for fall due to : History of fall(s) - - - -  Follow up Falls prevention discussed - - - -   Is the patient's home free of loose throw rugs in walkways, pet beds, electrical cords, etc?   yes      Grab bars in the bathroom? yes      Handrails on the stairs?   yes      Adequate lighting?   yes  Depression Screen PHQ 2/9 Scores 12/03/2018 08/24/2018 04/23/2018 05/08/2017  PHQ - 2 Score 0 0 0 0  PHQ- 9 Score - 2 0 -     Cognitive Function     6CIT Screen 12/03/2018  What Year? 0 points  What month? 0  points  What time? 0 points  Count back from 20 0 points  Months in reverse 0 points  Repeat phrase 2 points  Total Score 2    Immunization History  Administered Date(s) Administered  . HiB (PRP-OMP) 07/04/2017  . Influenza Split 05/29/2010, 05/18/2011, 05/15/2012  . Influenza, High Dose Seasonal PF 04/26/2016, 05/06/2017, 07/04/2017, 05/03/2018  . Influenza-Unspecified 05/29/2010, 05/18/2011, 05/15/2012, 05/29/2013, 05/28/2014, 05/01/2015, 06/29/2016  . Meningococcal Conjugate 11/07/2017, 01/02/2018  . Pneumococcal Conjugate-13 04/26/2016, 07/04/2017  . Pneumococcal Polysaccharide-23 03/30/2007, 11/07/2017  . Pneumococcal-Unspecified 03/30/2007  . Tdap 04/19/2008  . Zoster 08/29/2008     Screening Tests Health Maintenance  Topic Date Due  . INFLUENZA VACCINE  03/30/2019  . TETANUS/TDAP  08/28/2020  . DEXA SCAN  Completed  . PNA vac Low Risk Adult  Completed        Plan:    Please schedule your next medicare wellness visit with me in 1 yr. Continue doing brain stimulating activities (puzzles, reading, adult coloring books, staying active) to keep memory sharp.  Bring a copy of your living will and/or healthcare power of attorney to your next office visit.        I have personally reviewed and noted the following in the patient's chart:   . Medical and social history . Use of alcohol, tobacco or illicit drugs  . Current medications and supplements . Functional ability and status . Nutritional status . Physical activity . Advanced directives . List of other physicians . Hospitalizations, surgeries, and ER visits in previous 12 months . Vitals . Screenings to include cognitive, depression, and falls . Referrals and appointments  In addition, I have reviewed and discussed with  patient certain preventive protocols, quality metrics, and best practice recommendations. A written personalized care plan for preventive services as well as general preventive health  recommendations were provided to patient.     Joanne Chars, LPN  12/04/1857    Medical screening examination/treatment was performed by qualified clinical staff member and as supervising physician I was immediately available for consultation/collaboration. I have reviewed documentation and agree with assessment and plan.  Emeterio Reeve, DO

## 2018-12-03 ENCOUNTER — Other Ambulatory Visit: Payer: Self-pay

## 2018-12-03 ENCOUNTER — Ambulatory Visit (INDEPENDENT_AMBULATORY_CARE_PROVIDER_SITE_OTHER): Payer: Medicare Other | Admitting: *Deleted

## 2018-12-03 VITALS — BP 143/78 | HR 74 | Temp 97.7°F | Ht <= 58 in | Wt 108.0 lb

## 2018-12-03 DIAGNOSIS — Z0000 Encounter for general adult medical examination without abnormal findings: Secondary | ICD-10-CM | POA: Diagnosis not present

## 2018-12-03 NOTE — Patient Instructions (Addendum)
Please schedule your next medicare wellness visit with me in 1 yr. Continue doing brain stimulating activities (puzzles, reading, adult coloring books, staying active) to keep memory sharp.  Bring a copy of your living will and/or healthcare power of attorney to your next office visit. Please call the office and schedule a follow up with Dr. Sheppard Coil to check on Blood Pressure and have labs done to check on your sugars. Pt agrees to call and schedule a follow up.

## 2019-01-23 ENCOUNTER — Telehealth (INDEPENDENT_AMBULATORY_CARE_PROVIDER_SITE_OTHER): Payer: Medicare Other | Admitting: Osteopathic Medicine

## 2019-01-23 DIAGNOSIS — R7309 Other abnormal glucose: Secondary | ICD-10-CM

## 2019-01-23 DIAGNOSIS — I10 Essential (primary) hypertension: Secondary | ICD-10-CM

## 2019-01-23 DIAGNOSIS — E782 Mixed hyperlipidemia: Secondary | ICD-10-CM

## 2019-01-23 NOTE — Telephone Encounter (Signed)
Spoke w/ patient while on virtual visit w/ her husband, she needs labs rechecked for prediabetes and cholesterol.  Time spent 5 mins

## 2019-01-25 DIAGNOSIS — E782 Mixed hyperlipidemia: Secondary | ICD-10-CM | POA: Diagnosis not present

## 2019-01-25 DIAGNOSIS — I10 Essential (primary) hypertension: Secondary | ICD-10-CM | POA: Diagnosis not present

## 2019-01-25 DIAGNOSIS — R7309 Other abnormal glucose: Secondary | ICD-10-CM | POA: Diagnosis not present

## 2019-01-26 LAB — COMPLETE METABOLIC PANEL WITH GFR
AG Ratio: 1.5 (calc) (ref 1.0–2.5)
ALT: 16 U/L (ref 6–29)
AST: 19 U/L (ref 10–35)
Albumin: 4.1 g/dL (ref 3.6–5.1)
Alkaline phosphatase (APISO): 53 U/L (ref 37–153)
BUN: 18 mg/dL (ref 7–25)
CO2: 26 mmol/L (ref 20–32)
Calcium: 9.7 mg/dL (ref 8.6–10.4)
Chloride: 107 mmol/L (ref 98–110)
Creat: 0.88 mg/dL (ref 0.60–0.93)
GFR, Est African American: 72 mL/min/{1.73_m2} (ref >=60)
GFR, Est Non African American: 62 mL/min/{1.73_m2} (ref >=60)
Globulin: 2.7 g/dL (calc) (ref 1.9–3.7)
Glucose, Bld: 96 mg/dL (ref 65–99)
Potassium: 5.4 mmol/L — ABNORMAL HIGH (ref 3.5–5.3)
Sodium: 142 mmol/L (ref 135–146)
Total Bilirubin: 0.4 mg/dL (ref 0.2–1.2)
Total Protein: 6.8 g/dL (ref 6.1–8.1)

## 2019-01-26 LAB — HEMOGLOBIN A1C
Hgb A1c MFr Bld: 5.6 % of total Hgb (ref <5.7)
Mean Plasma Glucose: 114 (calc)
eAG (mmol/L): 6.3 (calc)

## 2019-01-26 LAB — LIPID PANEL
Cholesterol: 243 mg/dL — ABNORMAL HIGH (ref <200)
HDL: 68 mg/dL (ref >=50)
LDL Cholesterol (Calc): 150 mg/dL (calc) — ABNORMAL HIGH
Non-HDL Cholesterol (Calc): 175 mg/dL (calc) — ABNORMAL HIGH (ref <130)
Total CHOL/HDL Ratio: 3.6 (calc) (ref <5.0)
Triglycerides: 126 mg/dL (ref <150)

## 2019-02-01 NOTE — Telephone Encounter (Signed)
Attempted to reach patient to make an appointment for a medication follow up. Left voicemail with information.

## 2019-02-04 ENCOUNTER — Encounter: Payer: Self-pay | Admitting: Osteopathic Medicine

## 2019-02-04 ENCOUNTER — Ambulatory Visit (INDEPENDENT_AMBULATORY_CARE_PROVIDER_SITE_OTHER): Payer: Medicare Other | Admitting: Osteopathic Medicine

## 2019-02-04 VITALS — Wt 108.0 lb

## 2019-02-04 DIAGNOSIS — I10 Essential (primary) hypertension: Secondary | ICD-10-CM

## 2019-02-04 DIAGNOSIS — H11151 Pinguecula, right eye: Secondary | ICD-10-CM | POA: Diagnosis not present

## 2019-02-04 DIAGNOSIS — H02052 Trichiasis without entropian right lower eyelid: Secondary | ICD-10-CM | POA: Diagnosis not present

## 2019-02-04 NOTE — Progress Notes (Signed)
Virtual Visit via Video (App used: Doximity) Note  I connected with      Mercedes Decker on 02/04/19 at 10:20 AM by a telemedicine application and verified that I am speaking with the correct person using two identifiers.  Patient is at home I am working from home    I discussed the limitations of evaluation and management by telemedicine and the availability of in person appointments. The patient expressed understanding and agreed to proceed.  History of Present Illness: Mercedes Decker is a 78 y.o. female who would like to discuss  Chief Complaint  Patient presents with  . Medication Problem    wants to change bp medications   Prediabetes: A1C recent check 5.6  HTN: Doesn't want to be taking amlodipine or metoprolol, there was some confusion on my end given the phone tag so we decided to set up virtual visit to discuss more.  Hx cough w/ ACEI, Losartan caused sweating, cramping Concerned about problems with circulation d/t Metropolol.  Amlodipine no problem.  Reports she does get lightheaded w/ walking, intermittently, more so if hot outside.       Observations/Objective: Wt 108 lb (49 kg)   BMI 22.57 kg/m  BP Readings from Last 3 Encounters:  12/03/18 (!) 143/78  10/18/18 (!) 142/63  08/24/18 (!) 143/60   Exam: Normal Speech.    Lab and Radiology Results No results found for this or any previous visit (from the past 72 hour(s)). No results found.     Assessment and Plan: 78 y.o. female with The encounter diagnosis was Essential hypertension.  Given lightheadedness, advised patient be checking blood pressure at home, okay to stop the metoprolol but could expect palpitations after doing this.  If palpitations are severe, we may want to get back on metoprolol or could certainly consider alternatives such as Bystolic.  Depending on blood pressure numbers, goal 140/90 or less, could also increase the amlodipine.  Patient has history of intolerance to ACE  inhibitor and ARB's.  Patient was advised to contact us at the end of this week for letting us know what blood pressure numbers are looking like and how her symptoms are.  I also sent a reminder in epic  PDMP not reviewed this encounter. No orders of the defined types were placed in this encounter.  No orders of the defined types were placed in this encounter.  There are no Patient Instructions on file for this visit.  Instructions sent via MyChart. If MyChart not available, pt was given option for info via personal e-mail w/ no guarantee of protected health info over unsecured e-mail communication, and MyChart sign-up instructions were included.   Follow Up Instructions: Return for Recheck depending on symptoms/blood pressure numbers, patient to call later this week.    I discussed the assessment and treatment plan with the patient. The patient was provided an opportunity to ask questions and all were answered. The patient agreed with the plan and demonstrated an understanding of the instructions.   The patient was advised to call back or seek an in-person evaluation if any new concerns, if symptoms worsen or if the condition fails to improve as anticipated.  25 minutes of non-face-to-face time was provided during this encounter.                      Historical information moved to improve visibility of documentation.  Past Medical History:  Diagnosis Date  . Arthritis   . Hypertension   .  Migraine   . Osteoporosis   . Prediabetes 08/28/2018   Past Surgical History:  Procedure Laterality Date  . ABDOMINAL HYSTERECTOMY    . BACK SURGERY    . BREAST SURGERY    . BUNIONECTOMY    . INCISIONAL HERNIA REPAIR    . KNEE ARTHROSCOPY     Social History   Tobacco Use  . Smoking status: Never Smoker  . Smokeless tobacco: Never Used  Substance Use Topics  . Alcohol use: No   family history includes Alzheimer's disease in her mother; Cancer in her brother and  sister; Diabetes in her brother and sister; Heart disease in her brother and father.  Medications: Current Outpatient Medications  Medication Sig Dispense Refill  . acetaminophen (TYLENOL) 650 MG CR tablet Take 650 mg by mouth every 8 (eight) hours as needed for pain.    Marland Kitchen amLODipine (NORVASC) 5 MG tablet Take 1 tablet (5 mg total) by mouth daily. Pt needs f/u appt w/PCP for further refills. 90 tablet 3  . aspirin EC 81 MG tablet Take 1 tablet (81 mg total) by mouth daily.    Marland Kitchen CALCIUM-MAGNESIUM-ZINC PO Take by mouth.    . cholecalciferol (VITAMIN D) 1000 UNITS tablet Take 2,000 Units by mouth daily.    . Cyanocobalamin (VITAMIN B 12 PO) Take by mouth.    . denosumab (PROLIA) 60 MG/ML SOLN injection Inject 60 mg into the skin every 6 (six) months. Administer in upper arm, thigh, or abdomen    . magnesium gluconate (MAGONATE) 30 MG tablet Take 30 mg by mouth 2 (two) times daily.    . SUMAtriptan 6 MG/0.5ML SOAJ Inject 1 Syringe into the skin once. Inject 6 mg into skin at onset of headache one time 1 Syringe 3  . vitamin C (ASCORBIC ACID) 500 MG tablet Take 500 mg by mouth daily.    . vitamin E (VITAMIN E) 400 UNIT capsule Take 400 Units by mouth daily.    . celecoxib (CELEBREX) 200 MG capsule One to 2 tablets by mouth daily as needed for pain. (Patient not taking: Reported on 02/04/2019) 60 capsule 2  . cyclobenzaprine (FLEXERIL) 5 MG tablet Take 1 tablet (5 mg total) by mouth at bedtime as needed for muscle spasms. (Patient not taking: Reported on 12/03/2018) 20 tablet 0  . triamcinolone cream (KENALOG) 0.1 % Apply 1 application topically 2 (two) times daily. (Patient not taking: Reported on 02/04/2019) 30 g 0   No current facility-administered medications for this visit.    Allergies  Allergen Reactions  . Tape Other (See Comments)    BANDAIDS AND ADHESIVE TAPES TEAR SKIN (VERY THIN SKIN)  REQUESTING HYPOALLERGENIC TAPE.  "OUCHLESS TAPE." IS OK   . Cortisone Other (See Comments)    Flushing  and worsening joint pain   . Meloxicam Other (See Comments)    Too sleepy   . Ace Inhibitors Cough  . Atorvastatin Other (See Comments)    Possible allergy  . Losartan Other (See Comments)    Sweats, cramps  . Vicodin [Hydrocodone-Acetaminophen] Other (See Comments)    Pt states that she stops breathing when she takes  Pain meds    PDMP not reviewed this encounter. No orders of the defined types were placed in this encounter.  No orders of the defined types were placed in this encounter.

## 2019-02-11 ENCOUNTER — Encounter: Payer: Self-pay | Admitting: Osteopathic Medicine

## 2019-02-19 DIAGNOSIS — Z9081 Acquired absence of spleen: Secondary | ICD-10-CM | POA: Diagnosis not present

## 2019-02-19 DIAGNOSIS — M818 Other osteoporosis without current pathological fracture: Secondary | ICD-10-CM | POA: Diagnosis not present

## 2019-02-26 ENCOUNTER — Ambulatory Visit (INDEPENDENT_AMBULATORY_CARE_PROVIDER_SITE_OTHER): Payer: Medicare Other

## 2019-02-26 ENCOUNTER — Other Ambulatory Visit: Payer: Self-pay

## 2019-02-26 ENCOUNTER — Ambulatory Visit (INDEPENDENT_AMBULATORY_CARE_PROVIDER_SITE_OTHER): Payer: Medicare Other | Admitting: Sports Medicine

## 2019-02-26 ENCOUNTER — Encounter: Payer: Self-pay | Admitting: Sports Medicine

## 2019-02-26 DIAGNOSIS — M7061 Trochanteric bursitis, right hip: Secondary | ICD-10-CM | POA: Diagnosis not present

## 2019-02-26 DIAGNOSIS — Y939 Activity, unspecified: Secondary | ICD-10-CM | POA: Diagnosis not present

## 2019-02-26 NOTE — Assessment & Plan Note (Signed)
Repeat right greater trochanteric bursa injection, previous injection was last year some time. Printing rehab exercises, return to see me in 4 to 6 weeks. I do want some baseline x-rays of that hip.

## 2019-02-26 NOTE — Progress Notes (Signed)
Subjective:    CC: Right hip pain  HPI: This is a pleasant 78 year old female, she has a history of trochanteric bursitis, last injected last year some time, she is having a recurrence of pain in the right lateral hip, severe, persistent, localized with radiation into the buttock and down to the knee, worse when laying on the ipsilateral side.  She desires repeat interventional treatment today.  I reviewed the past medical history, family history, social history, surgical history, and allergies today and no changes were needed.  Please see the problem list section below in epic for further details.  Past Medical History: Past Medical History:  Diagnosis Date  . Arthritis   . Hypertension   . Migraine   . Osteoporosis   . Prediabetes 08/28/2018   Past Surgical History: Past Surgical History:  Procedure Laterality Date  . ABDOMINAL HYSTERECTOMY    . BACK SURGERY    . BREAST SURGERY    . BUNIONECTOMY    . INCISIONAL HERNIA REPAIR    . KNEE ARTHROSCOPY     Social History: Social History   Socioeconomic History  . Marital status: Married    Spouse name: Purcell Nails  . Number of children: 1  . Years of education: 12  . Highest education level: 12th grade  Occupational History  . 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 Financial resource strain: Not hard at all  . Food insecurity    Worry: Never true    Inability: Never true  . Transportation needs    Medical: No    Non-medical: No  Tobacco Use  . Smoking status: Never Smoker  . Smokeless tobacco: Never Used  Substance and Sexual Activity  . Alcohol use: No  . Drug use: No  . Sexual activity: Yes    Birth control/protection: Post-menopausal  Lifestyle  . Physical activity    Days per week: 7 days    Minutes per session: 20 min  . Stress: Not at all  Relationships  . Social connections    Talks on phone: More than three times a week    Gets together: Twice a week    Attends religious  service: More than 4 times per year    Active member of club or organization: No    Attends meetings of clubs or organizations: Never    Relationship status: Married  Other Topics Concern  . Not on file  Social History Narrative   Retired. Works around [employer]. Walks daily. No caffeine   Family History: Family History  Problem Relation Age of Onset  . Alzheimer's disease Mother   . Heart disease Father   . Cancer Sister   . Diabetes Sister   . Heart disease Brother   . Diabetes Brother   . Cancer Brother    Allergies: Allergies  Allergen Reactions  . Tape Other (See Comments)    BANDAIDS AND ADHESIVE TAPES TEAR SKIN (VERY THIN SKIN)  REQUESTING HYPOALLERGENIC TAPE.  "OUCHLESS TAPE." IS OK   . Cortisone Other (See Comments)    Flushing and worsening joint pain   . Meloxicam Other (See Comments)    Too sleepy   . Ace Inhibitors Cough  . Atorvastatin Other (See Comments)    Possible allergy  . Losartan Other (See Comments)    Sweats, cramps  . Vicodin [Hydrocodone-Acetaminophen] Other (See Comments)    Pt states that she stops breathing when she takes  Pain meds   Medications: See med rec.  Review of Systems: No fevers, chills, night sweats, weight loss, chest pain, or shortness of breath.   Objective:    General: Well Developed, well nourished, and in no acute distress.  Neuro: Alert and oriented x3, extra-ocular muscles intact, sensation grossly intact.  HEENT: Normocephalic, atraumatic, pupils equal round reactive to light, neck supple, no masses, no lymphadenopathy, thyroid nonpalpable.  Skin: Warm and dry, no rashes. Cardiac: Regular rate and rhythm, no murmurs rubs or gallops, no lower extremity edema.  Respiratory: Clear to auscultation bilaterally. Not using accessory muscles, speaking in full sentences. Right hip: ROM IR: 60 Deg, ER: 60 Deg, Flexion: 120 Deg, Extension: 100 Deg, Abduction: 45 Deg, Adduction: 45 Deg Strength IR: 5/5, ER: 5/5, Flexion:  5/5, Extension: 5/5, Abduction: 5/5, Adduction: 5/5 Pelvic alignment unremarkable to inspection and palpation. Standing hip rotation and gait without trendelenburg / unsteadiness. Greater trochanter with tenderness to palpation. No tenderness over piriformis. No SI joint tenderness and normal minimal SI movement.  Procedure: Real-time Ultrasound Guided injection of the right greater trochanteric bursa Device: GE Logiq E  Verbal informed consent obtained.  Time-out conducted.  Noted no overlying erythema, induration, or other signs of local infection.  Skin prepped in a sterile fashion.  Local anesthesia: Topical Ethyl chloride.  With sterile technique and under real time ultrasound guidance:  22-gauge spinal needle advanced to the greater trochanter, contacted bone and then injected 1 cc Kenalog 40, 2 cc lidocaine, 2 cc bupivacaine. Completed without difficulty  Pain immediately resolved suggesting accurate placement of the medication.  Advised to call if fevers/chills, erythema, induration, drainage, or persistent bleeding.  Images permanently stored and available for review in the ultrasound unit.  Impression: Technically successful ultrasound guided injection.  Impression and Recommendations:    Greater trochanteric bursitis of right hip Repeat right greater trochanteric bursa injection, previous injection was last year some time. Printing rehab exercises, return to see me in 4 to 6 weeks. I do want some baseline x-rays of that hip.   ___________________________________________ Gwen Her. Dianah Field, M.D., ABFM., CAQSM. Primary Care and Sports Medicine [facility]  Adjunct Professor of [employer]

## 2019-03-04 ENCOUNTER — Ambulatory Visit (INDEPENDENT_AMBULATORY_CARE_PROVIDER_SITE_OTHER): Payer: Medicare Other | Admitting: Sports Medicine

## 2019-03-04 ENCOUNTER — Encounter: Payer: Self-pay | Admitting: Sports Medicine

## 2019-03-04 ENCOUNTER — Ambulatory Visit (INDEPENDENT_AMBULATORY_CARE_PROVIDER_SITE_OTHER): Payer: Medicare Other

## 2019-03-04 ENCOUNTER — Other Ambulatory Visit: Payer: Self-pay

## 2019-03-04 DIAGNOSIS — M5126 Other intervertebral disc displacement, lumbar region: Secondary | ICD-10-CM | POA: Diagnosis not present

## 2019-03-04 DIAGNOSIS — M48062 Spinal stenosis, lumbar region with neurogenic claudication: Secondary | ICD-10-CM | POA: Diagnosis not present

## 2019-03-04 DIAGNOSIS — M4807 Spinal stenosis, lumbosacral region: Secondary | ICD-10-CM

## 2019-03-04 DIAGNOSIS — M5136 Other intervertebral disc degeneration, lumbar region: Secondary | ICD-10-CM | POA: Diagnosis not present

## 2019-03-04 DIAGNOSIS — M7061 Trochanteric bursitis, right hip: Secondary | ICD-10-CM

## 2019-03-04 DIAGNOSIS — M4696 Unspecified inflammatory spondylopathy, lumbar region: Secondary | ICD-10-CM | POA: Diagnosis not present

## 2019-03-04 DIAGNOSIS — M47816 Spondylosis without myelopathy or radiculopathy, lumbar region: Secondary | ICD-10-CM | POA: Diagnosis not present

## 2019-03-04 MED ORDER — TRAMADOL HCL 50 MG PO TABS: 50.0000 mg | ORAL_TABLET | Freq: Three times a day (TID) | ORAL | 0 refills | Status: DC | PRN

## 2019-03-04 MED ORDER — PREDNISONE 50 MG PO TABS: ORAL_TABLET | ORAL | 0 refills | Status: DC

## 2019-03-04 NOTE — Addendum Note (Signed)
Addended by: Silverio Decamp on: 03/04/2019 04:23 PM   Modules accepted: Orders

## 2019-03-04 NOTE — Assessment & Plan Note (Signed)
Good response to right lateral hip pain, now resolved after trochanteric bursa injection 7 days ago. She still has some axial back pain.

## 2019-03-04 NOTE — Progress Notes (Addendum)
Subjective:    CC: Back pain  HPI: This is a pleasant 78 year old female, we treated her for right trochanteric bursitis at the last visit, pain on her lateral hip has since resolved, she continues to have pain but this time in the right-sided low back with radiation to the buttock, she does feel some weakness in her right lower leg.  No bowel or bladder dysfunction, saddle numbness, constitutional symptoms.  I reviewed the past medical history, family history, social history, surgical history, and allergies today and no changes were needed.  Please see the problem list section below in epic for further details.  Past Medical History: Past Medical History:  Diagnosis Date  . Arthritis   . Hypertension   . Migraine   . Osteoporosis   . Prediabetes 08/28/2018   Past Surgical History: Past Surgical History:  Procedure Laterality Date  . ABDOMINAL HYSTERECTOMY    . BACK SURGERY    . BREAST SURGERY    . BUNIONECTOMY    . INCISIONAL HERNIA REPAIR    . KNEE ARTHROSCOPY     Social History: Social History   Socioeconomic History  . Marital status: Married    Spouse name: Purcell Nails  . Number of children: 1  . Years of education: 26  . Highest education level: 12th grade  Occupational History  . 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 Financial resource strain: Not hard at all  . Food insecurity    Worry: Never true    Inability: Never true  . Transportation needs    Medical: No    Non-medical: No  Tobacco Use  . Smoking status: Never Smoker  . Smokeless tobacco: Never Used  Substance and Sexual Activity  . Alcohol use: No  . Drug use: No  . Sexual activity: Yes    Birth control/protection: Post-menopausal  Lifestyle  . Physical activity    Days per week: 7 days    Minutes per session: 20 min  . Stress: Not at all  Relationships  . Social connections    Talks on phone: More than three times a week    Gets together: Twice a week   Attends religious service: More than 4 times per year    Active member of club or organization: No    Attends meetings of clubs or organizations: Never    Relationship status: Married  Other Topics Concern  . Not on file  Social History Narrative   Retired. Works around [employer]. Walks daily. No caffeine   Family History: Family History  Problem Relation Age of Onset  . Alzheimer's disease Mother   . Heart disease Father   . Cancer Sister   . Diabetes Sister   . Heart disease Brother   . Diabetes Brother   . Cancer Brother    Allergies: Allergies  Allergen Reactions  . Tape Other (See Comments)    BANDAIDS AND ADHESIVE TAPES TEAR SKIN (VERY THIN SKIN)  REQUESTING HYPOALLERGENIC TAPE.  "OUCHLESS TAPE." IS OK   . Cortisone Other (See Comments)    Flushing and worsening joint pain   . Meloxicam Other (See Comments)    Too sleepy   . Ace Inhibitors Cough  . Atorvastatin Other (See Comments)    Possible allergy  . Losartan Other (See Comments)    Sweats, cramps  . Vicodin [Hydrocodone-Acetaminophen] Other (See Comments)    Pt states that she stops breathing when she takes  Pain meds   Medications: See  med rec.  Review of Systems: No fevers, chills, night sweats, weight loss, chest pain, or shortness of breath.   Objective:    General: Well Developed, well nourished, and in no acute distress.  Neuro: Alert and oriented x3, extra-ocular muscles intact, sensation grossly intact.  HEENT: Normocephalic, atraumatic, pupils equal round reactive to light, neck supple, no masses, no lymphadenopathy, thyroid nonpalpable.  Skin: Warm and dry, no rashes. Cardiac: Regular rate and rhythm, no murmurs rubs or gallops, no lower extremity edema.  Respiratory: Clear to auscultation bilaterally. Not using accessory muscles, speaking in full sentences.  MRI shows multilevel spondylosis, I do see contact of the L3 nerve roots on the right side with the L3-L4 disc.  Impression and  Recommendations:    Greater trochanteric bursitis of right hip Good response to right lateral hip pain, now resolved after trochanteric bursa injection 7 days ago. She still has some axial back pain.  Neurogenic claudication due to lumbar spinal stenosis Right-sided, with radiculopathy in an L4 distribution. She does have some weakness in the leg. Stat lumbar spine MRI, prednisone, tramadol. I do suspect a right L4-L5 epidural. Return to see me next week.  Symptoms are right L4 distribution, I do see more compression of the L3 nerve root on the right on MRI. For this reason I am going to set her up for a right L3-L4 transforaminal epidural with Dr. Francesco Runner.   ___________________________________________ Gwen Her. Dianah Field, M.D., ABFM., CAQSM. Primary Care and Sports Medicine [facility]  Adjunct Professor of [place] of [facility] [employer]

## 2019-03-04 NOTE — Assessment & Plan Note (Addendum)
Right-sided, with radiculopathy in an L4 distribution. She does have some weakness in the leg. Stat lumbar spine MRI, prednisone, tramadol. I do suspect a right L4-L5 epidural. Return to see me next week.  Symptoms are right L4 distribution, I do see more compression of the L3 nerve root on the right on MRI. For this reason I am going to set her up for a right L3-L4 transforaminal epidural with Dr. Francesco Runner.

## 2019-03-11 ENCOUNTER — Ambulatory Visit (INDEPENDENT_AMBULATORY_CARE_PROVIDER_SITE_OTHER): Payer: Medicare Other | Admitting: Sports Medicine

## 2019-03-11 ENCOUNTER — Ambulatory Visit: Payer: Medicare Other | Admitting: Sports Medicine

## 2019-03-11 ENCOUNTER — Other Ambulatory Visit: Payer: Self-pay

## 2019-03-11 ENCOUNTER — Encounter: Payer: Self-pay | Admitting: Sports Medicine

## 2019-03-11 DIAGNOSIS — M48062 Spinal stenosis, lumbar region with neurogenic claudication: Secondary | ICD-10-CM

## 2019-03-11 NOTE — Assessment & Plan Note (Signed)
Right-sided L4 distribution radiculopathy, as well as neurogenic claudication. We ordered her right lumbar epidural with Dr. Francesco Runner, she has not yet been contacted, I have advised her to go ahead and call Dr. Isabelle Course office. I do see more right L3 nerve root compression on the MRI, but her symptoms are predominantly right-sided L4. We could potentially target both levels with right L3-L4 and a right L4-L5 transforaminal epidurals, I will leave this up to Dr. Isabelle Course discretion.

## 2019-03-11 NOTE — Progress Notes (Signed)
Subjective:    CC: MRI follow-up  HPI: Mercedes Decker is a very pleasant 78 year old female, she has had low back pain with right-sided L4 distribution radiculopathy, she improved extremely well and felt great on prednisone but as soon as it stopped her pain came back.  I reviewed the past medical history, family history, social history, surgical history, and allergies today and no changes were needed.  Please see the problem list section below in epic for further details.  Past Medical History: Past Medical History:  Diagnosis Date  . Arthritis   . Hypertension   . Migraine   . Osteoporosis   . Prediabetes 08/28/2018   Past Surgical History: Past Surgical History:  Procedure Laterality Date  . ABDOMINAL HYSTERECTOMY    . BACK SURGERY    . BREAST SURGERY    . BUNIONECTOMY    . INCISIONAL HERNIA REPAIR    . KNEE ARTHROSCOPY     Social History: Social History   Socioeconomic History  . Marital status: Married    Spouse name: Purcell Nails  . Number of children: 1  . Years of education: 53  . Highest education level: 12th grade  Occupational History  . 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 Financial resource strain: Not hard at all  . Food insecurity    Worry: Never true    Inability: Never true  . Transportation needs    Medical: No    Non-medical: No  Tobacco Use  . Smoking status: Never Smoker  . Smokeless tobacco: Never Used  Substance and Sexual Activity  . Alcohol use: No  . Drug use: No  . Sexual activity: Yes    Birth control/protection: Post-menopausal  Lifestyle  . Physical activity    Days per week: 7 days    Minutes per session: 20 min  . Stress: Not at all  Relationships  . Social connections    Talks on phone: More than three times a week    Gets together: Twice a week    Attends religious service: More than 4 times per year    Active member of club or organization: No    Attends meetings of clubs or organizations: Never    Relationship status: Married  Other Topics Concern  . Not on file  Social History Narrative   Retired. Works around [employer]. Walks daily. No caffeine   Family History: Family History  Problem Relation Age of Onset  . Alzheimer's disease Mother   . Heart disease Father   . Cancer Sister   . Diabetes Sister   . Heart disease Brother   . Diabetes Brother   . Cancer Brother    Allergies: Allergies  Allergen Reactions  . Tape Other (See Comments)    BANDAIDS AND ADHESIVE TAPES TEAR SKIN (VERY THIN SKIN)  REQUESTING HYPOALLERGENIC TAPE.  "OUCHLESS TAPE." IS OK   . Cortisone Other (See Comments)    Flushing and worsening joint pain   . Meloxicam Other (See Comments)    Too sleepy   . Ace Inhibitors Cough  . Atorvastatin Other (See Comments)    Possible allergy  . Losartan Other (See Comments)    Sweats, cramps  . Vicodin [Hydrocodone-Acetaminophen] Other (See Comments)    Pt states that she stops breathing when she takes  Pain meds   Medications: See med rec.  Review of Systems: No fevers, chills, night sweats, weight loss, chest pain, or shortness of breath.   Objective:  General: Well Developed, well nourished, and in no acute distress.  Neuro: Alert and oriented x3, extra-ocular muscles intact, sensation grossly intact.  HEENT: Normocephalic, atraumatic, pupils equal round reactive to light, neck supple, no masses, no lymphadenopathy, thyroid nonpalpable.  Skin: Warm and dry, no rashes. Cardiac: Regular rate and rhythm, no murmurs rubs or gallops, no lower extremity edema.  Respiratory: Clear to auscultation bilaterally. Not using accessory muscles, speaking in full sentences.  Impression and Recommendations:    Neurogenic claudication due to lumbar spinal stenosis Right-sided L4 distribution radiculopathy, as well as neurogenic claudication. We ordered her right lumbar epidural with Dr. Francesco Runner, she has not yet been contacted, I have advised her to go ahead and  call Dr. Isabelle Course office. I do see more right L3 nerve root compression on the MRI, but her symptoms are predominantly right-sided L4. We could potentially target both levels with right L3-L4 and a right L4-L5 transforaminal epidurals, I will leave this up to Dr. Isabelle Course discretion.   ___________________________________________ Gwen Her. Dianah Field, M.D., ABFM., CAQSM. Primary Care and Sports Medicine [facility]  Adjunct Professor of [place] of [facility] of Medicine

## 2019-03-18 DIAGNOSIS — M48061 Spinal stenosis, lumbar region without neurogenic claudication: Secondary | ICD-10-CM | POA: Diagnosis not present

## 2019-03-18 DIAGNOSIS — M5136 Other intervertebral disc degeneration, lumbar region: Secondary | ICD-10-CM | POA: Diagnosis not present

## 2019-03-18 DIAGNOSIS — M4726 Other spondylosis with radiculopathy, lumbar region: Secondary | ICD-10-CM | POA: Diagnosis not present

## 2019-04-09 DIAGNOSIS — H04123 Dry eye syndrome of bilateral lacrimal glands: Secondary | ICD-10-CM | POA: Diagnosis not present

## 2019-04-09 DIAGNOSIS — H2512 Age-related nuclear cataract, left eye: Secondary | ICD-10-CM | POA: Diagnosis not present

## 2019-04-09 DIAGNOSIS — H11151 Pinguecula, right eye: Secondary | ICD-10-CM | POA: Diagnosis not present

## 2019-04-09 DIAGNOSIS — H01009 Unspecified blepharitis unspecified eye, unspecified eyelid: Secondary | ICD-10-CM | POA: Diagnosis not present

## 2019-04-11 DIAGNOSIS — Z01812 Encounter for preprocedural laboratory examination: Secondary | ICD-10-CM | POA: Diagnosis not present

## 2019-04-11 DIAGNOSIS — H2512 Age-related nuclear cataract, left eye: Secondary | ICD-10-CM | POA: Diagnosis not present

## 2019-04-11 DIAGNOSIS — Z20828 Contact with and (suspected) exposure to other viral communicable diseases: Secondary | ICD-10-CM | POA: Diagnosis not present

## 2019-04-12 DIAGNOSIS — H2512 Age-related nuclear cataract, left eye: Secondary | ICD-10-CM | POA: Insufficient documentation

## 2019-04-15 DIAGNOSIS — H2512 Age-related nuclear cataract, left eye: Secondary | ICD-10-CM | POA: Diagnosis not present

## 2019-04-15 DIAGNOSIS — H04123 Dry eye syndrome of bilateral lacrimal glands: Secondary | ICD-10-CM | POA: Diagnosis not present

## 2019-04-15 DIAGNOSIS — Z888 Allergy status to other drugs, medicaments and biological substances status: Secondary | ICD-10-CM | POA: Diagnosis not present

## 2019-04-15 DIAGNOSIS — I10 Essential (primary) hypertension: Secondary | ICD-10-CM | POA: Diagnosis not present

## 2019-04-15 DIAGNOSIS — Z91048 Other nonmedicinal substance allergy status: Secondary | ICD-10-CM | POA: Diagnosis not present

## 2019-04-19 DIAGNOSIS — M4726 Other spondylosis with radiculopathy, lumbar region: Secondary | ICD-10-CM | POA: Diagnosis not present

## 2019-04-19 DIAGNOSIS — M5136 Other intervertebral disc degeneration, lumbar region: Secondary | ICD-10-CM | POA: Diagnosis not present

## 2019-04-19 DIAGNOSIS — M4807 Spinal stenosis, lumbosacral region: Secondary | ICD-10-CM | POA: Diagnosis not present

## 2019-04-19 DIAGNOSIS — I10 Essential (primary) hypertension: Secondary | ICD-10-CM | POA: Diagnosis not present

## 2019-04-29 ENCOUNTER — Other Ambulatory Visit: Payer: Self-pay

## 2019-04-29 ENCOUNTER — Encounter: Payer: Self-pay | Admitting: Family Medicine

## 2019-04-29 ENCOUNTER — Ambulatory Visit (INDEPENDENT_AMBULATORY_CARE_PROVIDER_SITE_OTHER): Payer: Medicare Other

## 2019-04-29 ENCOUNTER — Ambulatory Visit (INDEPENDENT_AMBULATORY_CARE_PROVIDER_SITE_OTHER): Payer: Medicare Other | Admitting: Family Medicine

## 2019-04-29 VITALS — BP 143/64 | HR 70 | Temp 98.2°F | Wt 110.0 lb

## 2019-04-29 DIAGNOSIS — M25532 Pain in left wrist: Secondary | ICD-10-CM

## 2019-04-29 DIAGNOSIS — Z23 Encounter for immunization: Secondary | ICD-10-CM

## 2019-04-29 DIAGNOSIS — S6992XA Unspecified injury of left wrist, hand and finger(s), initial encounter: Secondary | ICD-10-CM | POA: Diagnosis not present

## 2019-04-29 NOTE — Patient Instructions (Addendum)
Thank you for coming in today. I do not see a broken bone.  Radiology will look as well and I will get the report to you ASAP.  Ok to use brace as needed.  If improving let me know.   We will do the flu vaccine today.

## 2019-04-29 NOTE — Progress Notes (Signed)
Mercedes Decker is a 78 y.o. female who presents to [facility] today for left wrist pain.  Patient tripped and fell landing on her left wrist on Friday August 28.  She has noted little bit of pain over the swelling but overall feels pretty well.  She was seen for a big portion of last year July into October for a left wrist Colles' fracture.  She is worried that she may have reinjured it.   Additionally she would like a flu vaccine if possible while she is here.  ROS:  As above  Exam:  BP (!) 143/64    Pulse 70    Temp 98.2 F (36.8 C) (Oral)    Wt 110 lb (49.9 kg)    BMI 22.99 kg/m  Wt Readings from Last 5 Encounters:  04/29/19 110 lb (49.9 kg)  03/11/19 106 lb (48.1 kg)  03/04/19 108 lb (49 kg)  02/26/19 110 lb (49.9 kg)  02/04/19 108 lb (49 kg)   General: Well Developed, well nourished, and in no acute distress.  Neuro/Psych: Alert and oriented x3, extra-ocular muscles intact, able to move all 4 extremities, sensation grossly intact. Skin: Warm and dry, no rashes noted.  Respiratory: Not using accessory muscles, speaking in full sentences, trachea midline.  Cardiovascular: Pulses palpable, no extremity edema. Abdomen: Does not appear distended. MSK: Left wrist: Minimal swelling volar wrist.  Nontender.  Normal motion.  Pulses cap refill and sensation are intact distally.    Lab and Radiology Results X-ray images obtained today of left wrist personally and independently reviewed. Good healing of old fracture from last year.  No new acute fracture of the wrist.  Degenerative changes present throughout the hand and the wrist. Await formal radiology review    Assessment and Plan: 78 y.o. female with left wrist pain.  After fall likely contusion.  Plan to work on home exercise program and use wrist brace as needed.  Await radiology review.  Assuming no fracture plan to recheck if not improving.  May need advanced imaging if still  painful and no obvious cause.  Influenza vaccine given today prior to discharge.  PDMP not reviewed this encounter. Orders Placed This Encounter  Procedures   DG Wrist Complete Left    Standing Status:   Future    Number of Occurrences:   1    Standing Expiration Date:   06/28/2020    Order Specific Question:   Reason for Exam (SYMPTOM  OR DIAGNOSIS REQUIRED)    Answer:   fall wrist pain    Order Specific Question:   Preferred imaging location?    Answer:   [facility]    Order Specific Question:   Radiology Contrast Protocol - do NOT remove file path    Answer:   \[email or web address]   Flu vaccine HIGH DOSE PF (Fluzone High dose)   No orders of the defined types were placed in this encounter.   Historical information moved to improve visibility of documentation.  Past Medical History:  Diagnosis Date   Arthritis    Hypertension    Migraine    Osteoporosis    Prediabetes 08/28/2018   Past Surgical History:  Procedure Laterality Date   ABDOMINAL HYSTERECTOMY     BACK SURGERY     BREAST SURGERY     BUNIONECTOMY     INCISIONAL HERNIA REPAIR     KNEE ARTHROSCOPY     Social History   Tobacco Use   Smoking status:  Never Smoker   Smokeless tobacco: Never Used  Substance Use Topics   Alcohol use: No   family history includes Alzheimer's disease in her mother; Cancer in her brother and sister; Diabetes in her brother and sister; Heart disease in her brother and father.  Medications: Current Outpatient Medications  Medication Sig Dispense Refill   acetaminophen (TYLENOL) 650 MG CR tablet Take 650 mg by mouth every 8 (eight) hours as needed for pain.     amLODipine (NORVASC) 5 MG tablet Take 1 tablet (5 mg total) by mouth daily. Pt needs f/u appt w/PCP for further refills. 90 tablet 3   aspirin EC 81 MG tablet Take 1 tablet (81 mg total) by mouth daily.     CALCIUM-MAGNESIUM-ZINC PO Take by mouth.      cholecalciferol (VITAMIN D) 1000 UNITS tablet Take 2,000 Units by mouth daily.     Cyanocobalamin (VITAMIN B 12 PO) Take by mouth.     denosumab (PROLIA) 60 MG/ML SOLN injection Inject 60 mg into the skin every 6 (six) months. Administer in upper arm, thigh, or abdomen     magnesium gluconate (MAGONATE) 30 MG tablet Take 30 mg by mouth 2 (two) times daily.     predniSONE (DELTASONE) 50 MG tablet One tab PO daily for 5 days. 5 tablet 0   SUMAtriptan 6 MG/0.5ML SOAJ Inject 1 Syringe into the skin once. Inject 6 mg into skin at onset of headache one time 1 Syringe 3   traMADol (ULTRAM) 50 MG tablet Take 1 tablet (50 mg total) by mouth every 8 (eight) hours as needed for moderate pain. Maximum 6 tabs per day. 21 tablet 0   vitamin C (ASCORBIC ACID) 500 MG tablet Take 500 mg by mouth daily.     vitamin E (VITAMIN E) 400 UNIT capsule Take 400 Units by mouth daily.     Bromfenac Sodium (BROMSITE) 0.075 % SOLN Apply to eye.     moxifloxacin (VIGAMOX) 0.5 % ophthalmic solution Place 1 drop into the left eye 3 (three) times a day.     No current facility-administered medications for this visit.    Allergies  Allergen Reactions   Tape Other (See Comments)    BANDAIDS AND ADHESIVE TAPES TEAR SKIN (VERY THIN SKIN)  REQUESTING HYPOALLERGENIC TAPE.  "OUCHLESS TAPE." IS OK    Cortisone Other (See Comments)    Flushing and worsening joint pain    Meloxicam Other (See Comments)    Too sleepy    Ace Inhibitors Cough   Atorvastatin Other (See Comments)    Possible allergy   Losartan Other (See Comments)    Sweats, cramps   Vicodin [Hydrocodone-Acetaminophen] Other (See Comments)    Pt states that she stops breathing when she takes  Pain meds      Discussed warning signs or symptoms. Please see discharge instructions. Patient expresses understanding.

## 2019-06-05 ENCOUNTER — Other Ambulatory Visit: Payer: Self-pay | Admitting: Osteopathic Medicine

## 2019-06-05 DIAGNOSIS — I10 Essential (primary) hypertension: Secondary | ICD-10-CM

## 2019-06-21 DIAGNOSIS — M48061 Spinal stenosis, lumbar region without neurogenic claudication: Secondary | ICD-10-CM | POA: Diagnosis not present

## 2019-06-21 DIAGNOSIS — M5136 Other intervertebral disc degeneration, lumbar region: Secondary | ICD-10-CM | POA: Diagnosis not present

## 2019-06-21 DIAGNOSIS — M4726 Other spondylosis with radiculopathy, lumbar region: Secondary | ICD-10-CM | POA: Diagnosis not present

## 2019-07-24 DIAGNOSIS — M48061 Spinal stenosis, lumbar region without neurogenic claudication: Secondary | ICD-10-CM | POA: Diagnosis not present

## 2019-07-24 DIAGNOSIS — G5701 Lesion of sciatic nerve, right lower limb: Secondary | ICD-10-CM | POA: Diagnosis not present

## 2019-07-24 DIAGNOSIS — I10 Essential (primary) hypertension: Secondary | ICD-10-CM | POA: Diagnosis not present

## 2019-07-24 DIAGNOSIS — M5136 Other intervertebral disc degeneration, lumbar region: Secondary | ICD-10-CM | POA: Diagnosis not present

## 2019-08-06 DIAGNOSIS — M5136 Other intervertebral disc degeneration, lumbar region: Secondary | ICD-10-CM | POA: Diagnosis not present

## 2019-08-06 DIAGNOSIS — M48061 Spinal stenosis, lumbar region without neurogenic claudication: Secondary | ICD-10-CM | POA: Diagnosis not present

## 2019-08-06 DIAGNOSIS — M4726 Other spondylosis with radiculopathy, lumbar region: Secondary | ICD-10-CM | POA: Diagnosis not present

## 2019-08-08 ENCOUNTER — Telehealth: Payer: Self-pay

## 2019-08-08 DIAGNOSIS — I10 Essential (primary) hypertension: Secondary | ICD-10-CM

## 2019-08-08 NOTE — Telephone Encounter (Signed)
I probably call to confirm with patient, if this is an automatic refill that she needs to cancel, she should contact her pharmacy

## 2019-08-08 NOTE — Telephone Encounter (Signed)
[facility] pharmacy requesting med refill for metoprolol succ 50 mg. Rx not listed in pt's active med list. Per provider's note on 02/04/19  - pt did not want medication due to concerns regarding circulation. Pls advise, thanks.

## 2019-08-09 MED ORDER — METOPROLOL SUCCINATE ER 50 MG PO TB24: 50.0000 mg | ORAL_TABLET | Freq: Every day | ORAL | 3 refills | Status: DC

## 2019-08-09 NOTE — Telephone Encounter (Signed)
Contacted pt regarding med refill for metoprolol succ 50 mg. As per pt, she did request the medication from the pharmacy. Pt stated she did try to stop the medication but it was not agreeable with her. She would like to continue taking the medication and is not worry about side effects. Pls send bp rx to the pharmacy. Thanks.

## 2019-08-28 DIAGNOSIS — Z9889 Other specified postprocedural states: Secondary | ICD-10-CM | POA: Diagnosis not present

## 2019-08-28 DIAGNOSIS — Z9081 Acquired absence of spleen: Secondary | ICD-10-CM | POA: Diagnosis not present

## 2019-08-28 DIAGNOSIS — M818 Other osteoporosis without current pathological fracture: Secondary | ICD-10-CM | POA: Diagnosis not present

## 2019-08-28 DIAGNOSIS — Z87891 Personal history of nicotine dependence: Secondary | ICD-10-CM | POA: Diagnosis not present

## 2019-08-28 DIAGNOSIS — I10 Essential (primary) hypertension: Secondary | ICD-10-CM | POA: Diagnosis not present

## 2019-08-28 DIAGNOSIS — E559 Vitamin D deficiency, unspecified: Secondary | ICD-10-CM | POA: Diagnosis not present

## 2019-08-28 DIAGNOSIS — Z79899 Other long term (current) drug therapy: Secondary | ICD-10-CM | POA: Diagnosis not present

## 2019-08-28 DIAGNOSIS — K869 Disease of pancreas, unspecified: Secondary | ICD-10-CM | POA: Diagnosis not present

## 2019-08-28 DIAGNOSIS — Z5181 Encounter for therapeutic drug level monitoring: Secondary | ICD-10-CM | POA: Diagnosis not present

## 2019-09-02 ENCOUNTER — Encounter: Payer: Self-pay | Admitting: Family Medicine

## 2019-09-02 ENCOUNTER — Ambulatory Visit (INDEPENDENT_AMBULATORY_CARE_PROVIDER_SITE_OTHER): Payer: Medicare Other | Admitting: Family Medicine

## 2019-09-02 VITALS — BP 155/89 | HR 73 | Temp 97.2°F

## 2019-09-02 DIAGNOSIS — Z20822 Contact with and (suspected) exposure to covid-19: Secondary | ICD-10-CM

## 2019-09-02 DIAGNOSIS — I10 Essential (primary) hypertension: Secondary | ICD-10-CM | POA: Diagnosis not present

## 2019-09-02 DIAGNOSIS — J029 Acute pharyngitis, unspecified: Secondary | ICD-10-CM

## 2019-09-02 DIAGNOSIS — R519 Headache, unspecified: Secondary | ICD-10-CM

## 2019-09-02 NOTE — Progress Notes (Signed)
Virtual Visit via telephone note  I connected with Mercedes Decker on 09/02/19 at 10:10 AM EST by a video enabled telemedicine application and verified that I am speaking with the correct person using two identifiers.  Unable to get the video component to connect so was converted to a telephone call.   I discussed the limitations of evaluation and management by telemedicine and the availability of in person appointments. The patient expressed understanding and agreed to proceed.\  Was at home and I was in my office for the visit.  Subjective:    CC: sick x 2 days.      HPI:  C/O 2-3 days of sore throat and headache.  She said that she had a friend to come drop off a present to her home on December 26 and then found out afterwards that that person tested positive.  She started feeling bad herself on January 1.  She mostly just complains of sore throat and headache.  No fevers chills or sweats.  No GI symptoms.  No shortness of breath or cough.  She is concerned because she also helps to help care of her husband who is 63 and has some chronic health problems.  Not currently taking any over-the-counter medications.    Past medical history, Surgical history, Family history not pertinant except as noted below, Social history, Allergies, and medications have been entered into the medical record, reviewed, and corrections made.   Review of Systems: No fevers, chills, night sweats, weight loss, chest pain, or shortness of breath.   Objective:    General: Speaking clearly in complete sentences without any shortness of breath.  Alert and oriented x3.  Normal judgment. No apparent acute distress.    Impression and Recommendations:    Sore throat/headache-potential exposure to Covid-recommend testing for further evaluation and screening especially she is 78.  Did warn that symptoms may resolve quickly or they may actually worsen.  Did encourage her to give [place] call back if she develops any new  symptoms or worsening symptoms.  Otherwise continue conservative care.  She will need to self quarantine after getting tested.  If her husband develops any symptoms then please let us know immediately as well.    I discussed the assessment and treatment plan with the patient. The patient was provided an opportunity to ask questions and all were answered. The patient agreed with the plan and demonstrated an understanding of the instructions.   The patient was advised to call back or seek an in-person evaluation if the symptoms worsen or if the condition fails to improve as anticipated.   Beatrice Lecher, MD

## 2019-09-02 NOTE — Progress Notes (Signed)
Pt reports that she has had these sxs for the past 2 days.   She also stated that she was in close contact with someone (12/26) who tested positive.

## 2019-09-04 LAB — NOVEL CORONAVIRUS, NAA: SARS-CoV-2, NAA: NOT DETECTED

## 2019-09-04 LAB — SPECIMEN STATUS REPORT

## 2019-09-04 MED ORDER — AMLODIPINE BESYLATE 5 MG PO TABS: 5.0000 mg | ORAL_TABLET | Freq: Every day | ORAL | 1 refills | Status: DC

## 2019-09-04 NOTE — Addendum Note (Signed)
Addended by: Dessie Coma on: 09/04/2019 02:44 PM   Modules accepted: Orders

## 2019-09-12 DIAGNOSIS — M48061 Spinal stenosis, lumbar region without neurogenic claudication: Secondary | ICD-10-CM | POA: Diagnosis not present

## 2019-09-12 DIAGNOSIS — M5136 Other intervertebral disc degeneration, lumbar region: Secondary | ICD-10-CM | POA: Diagnosis not present

## 2019-09-12 DIAGNOSIS — M4726 Other spondylosis with radiculopathy, lumbar region: Secondary | ICD-10-CM | POA: Diagnosis not present

## 2019-09-19 ENCOUNTER — Ambulatory Visit: Payer: Medicare Other | Attending: Internal Medicine

## 2019-09-19 DIAGNOSIS — Z23 Encounter for immunization: Secondary | ICD-10-CM | POA: Insufficient documentation

## 2019-09-19 NOTE — Progress Notes (Signed)
   Covid-19 Vaccination Clinic  Name:  Mercedes Decker    MRN: [id number] DOB: Jan 18, 1941  09/19/2019  [name] was observed post Covid-19 immunization for 15 minutes without incidence. He was provided with Vaccine Information Sheet and instruction to access the V-Safe system.   [name] was instructed to call 911 with any severe reactions post vaccine: Marland Kitchen Difficulty breathing  . Swelling of your face and throat  . A fast heartbeat  . A bad rash all over your body  . Dizziness and weakness    Immunizations Administered    Name Date Dose VIS Date Route   Pfizer COVID-19 Vaccine 09/19/2019  9:21 AM 0.3 mL 08/09/2019 Intramuscular   Manufacturer: [place]   Lot: [id number]   [place]: [id number]

## 2019-10-10 ENCOUNTER — Ambulatory Visit: Payer: Medicare Other | Attending: Internal Medicine

## 2019-10-10 DIAGNOSIS — Z23 Encounter for immunization: Secondary | ICD-10-CM | POA: Insufficient documentation

## 2019-10-10 NOTE — Progress Notes (Signed)
   Covid-19 Vaccination Clinic  Name:  Mercedes Decker    MRN: [id number] DOB: 1940/09/10  10/10/2019  [name] was observed post Covid-19 immunization for 15 minutes without incidence. He was provided with Vaccine Information Sheet and instruction to access the V-Safe system.   [name] was instructed to call 911 with any severe reactions post vaccine: Marland Kitchen Difficulty breathing  . Swelling of your face and throat  . A fast heartbeat  . A bad rash all over your body  . Dizziness and weakness    Immunizations Administered    Name Date Dose VIS Date Route   Pfizer COVID-19 Vaccine 10/10/2019  9:11 AM 0.3 mL 08/09/2019 Intramuscular   Manufacturer: [employer]   Lot: [id number]   [place]: [id number]

## 2019-10-16 DIAGNOSIS — M4807 Spinal stenosis, lumbosacral region: Secondary | ICD-10-CM | POA: Diagnosis not present

## 2019-10-16 DIAGNOSIS — M4316 Spondylolisthesis, lumbar region: Secondary | ICD-10-CM | POA: Diagnosis not present

## 2019-10-16 DIAGNOSIS — M5136 Other intervertebral disc degeneration, lumbar region: Secondary | ICD-10-CM | POA: Diagnosis not present

## 2019-10-16 DIAGNOSIS — M5416 Radiculopathy, lumbar region: Secondary | ICD-10-CM | POA: Diagnosis not present

## 2019-10-16 DIAGNOSIS — R29898 Other symptoms and signs involving the musculoskeletal system: Secondary | ICD-10-CM | POA: Diagnosis not present

## 2019-10-16 DIAGNOSIS — M47816 Spondylosis without myelopathy or radiculopathy, lumbar region: Secondary | ICD-10-CM | POA: Diagnosis not present

## 2019-10-25 DIAGNOSIS — M4726 Other spondylosis with radiculopathy, lumbar region: Secondary | ICD-10-CM | POA: Diagnosis not present

## 2019-10-25 DIAGNOSIS — R29898 Other symptoms and signs involving the musculoskeletal system: Secondary | ICD-10-CM | POA: Diagnosis not present

## 2019-10-25 DIAGNOSIS — M5416 Radiculopathy, lumbar region: Secondary | ICD-10-CM | POA: Diagnosis not present

## 2019-10-25 DIAGNOSIS — M47816 Spondylosis without myelopathy or radiculopathy, lumbar region: Secondary | ICD-10-CM | POA: Diagnosis not present

## 2019-10-25 DIAGNOSIS — M5116 Intervertebral disc disorders with radiculopathy, lumbar region: Secondary | ICD-10-CM | POA: Diagnosis not present

## 2019-10-25 DIAGNOSIS — M5136 Other intervertebral disc degeneration, lumbar region: Secondary | ICD-10-CM | POA: Diagnosis not present

## 2019-10-25 DIAGNOSIS — M5117 Intervertebral disc disorders with radiculopathy, lumbosacral region: Secondary | ICD-10-CM | POA: Diagnosis not present

## 2019-10-25 DIAGNOSIS — M4807 Spinal stenosis, lumbosacral region: Secondary | ICD-10-CM | POA: Diagnosis not present

## 2019-10-25 DIAGNOSIS — M4727 Other spondylosis with radiculopathy, lumbosacral region: Secondary | ICD-10-CM | POA: Diagnosis not present

## 2019-12-04 DIAGNOSIS — H11823 Conjunctivochalasis, bilateral: Secondary | ICD-10-CM | POA: Diagnosis not present

## 2019-12-04 DIAGNOSIS — H04209 Unspecified epiphora, unspecified lacrimal gland: Secondary | ICD-10-CM | POA: Diagnosis not present

## 2019-12-04 DIAGNOSIS — H16223 Keratoconjunctivitis sicca, not specified as Sjogren's, bilateral: Secondary | ICD-10-CM | POA: Diagnosis not present

## 2019-12-06 ENCOUNTER — Encounter: Payer: Self-pay | Admitting: Osteopathic Medicine

## 2019-12-18 DIAGNOSIS — M5136 Other intervertebral disc degeneration, lumbar region: Secondary | ICD-10-CM | POA: Diagnosis not present

## 2019-12-18 DIAGNOSIS — M4726 Other spondylosis with radiculopathy, lumbar region: Secondary | ICD-10-CM | POA: Diagnosis not present

## 2019-12-22 ENCOUNTER — Emergency Department (INDEPENDENT_AMBULATORY_CARE_PROVIDER_SITE_OTHER)
Admission: EM | Admit: 2019-12-22 | Discharge: 2019-12-22 | Disposition: A | Discharge status: Home / Self Care | Payer: Medicare Other | Source: Home / Self Care | Attending: Family Medicine | Admitting: Family Medicine

## 2019-12-22 ENCOUNTER — Other Ambulatory Visit: Payer: Self-pay

## 2019-12-22 DIAGNOSIS — S30861A Insect bite (nonvenomous) of abdominal wall, initial encounter: Secondary | ICD-10-CM | POA: Diagnosis not present

## 2019-12-22 DIAGNOSIS — W57XXXA Bitten or stung by nonvenomous insect and other nonvenomous arthropods, initial encounter: Secondary | ICD-10-CM | POA: Diagnosis not present

## 2019-12-22 MED ORDER — DOXYCYCLINE HYCLATE 100 MG PO CAPS: 100.0000 mg | ORAL_CAPSULE | Freq: Two times a day (BID) | ORAL | 0 refills | Status: DC

## 2019-12-22 NOTE — Discharge Instructions (Addendum)
May apply 1% hydrocortisone cream as needed for itching.

## 2019-12-22 NOTE — ED Triage Notes (Signed)
Pt c/o tick bite since yesterday. Removed tick herself. Applied alcohol several times after removal. C/o of itching and redness now.

## 2019-12-22 NOTE — ED Provider Notes (Signed)
[name]    CSN: [id number] Arrival date & time: 12/22/19  1002      History   Chief Complaint Chief Complaint  Patient presents with  . Insect Bite    Tick    HPI Mercedes Decker is a 79 y.o. female.   Patient found a non-engorged lone star tick embedded on her abdomen yesterday, and was able to easily remove it.  Today the bite site is erythematous, painful, and pruritic.  She feels well otherwise.  The history is provided by the patient.    Past Medical History:  Diagnosis Date  . Arthritis   . Hypertension   . Migraine   . Osteoporosis   . Prediabetes 08/28/2018    Patient Active Problem List   Diagnosis Date Noted  . Neurogenic claudication due to lumbar spinal stenosis 03/04/2019  . Prediabetes 08/28/2018  . Fall 02/16/2018  . Primary osteoarthritis of both knees 11/21/2017  . Greater trochanteric bursitis of right hip 10/23/2017  . Pancreatic tumor 05/08/2017  . Hoarseness 02/10/2017  . Right shoulder injury 08/18/2016  . TIA (transient ischemic attack) 03/08/2016  . Right hand pain 03/07/2016  . Patient has active power of attorney for health care 11/30/2015  . Essential hypertension 12/25/2014  . Murmur, cardiac 09/11/2012  . DJD (degenerative joint disease) 09/03/2012  . S/P hysterectomy 09/03/2012  . Osteoporosis  L fem neck  Dexa 07/2014  -3.1 09/03/2012  . Migraine 09/03/2012  . History of anemia 09/03/2012  . Mixed hyperlipidemia 09/03/2012  . GERD (gastroesophageal reflux disease) 09/03/2012    Past Surgical History:  Procedure Laterality Date  . ABDOMINAL HYSTERECTOMY    . BACK SURGERY    . BREAST SURGERY    . BUNIONECTOMY    . INCISIONAL HERNIA REPAIR    . KNEE ARTHROSCOPY      OB History   No obstetric history on file.      Home Medications    Prior to Admission medications   Medication Sig Start Date End Date Taking? Authorizing Provider  Lifitegrast Shirley Friar) 5 % SOLN Apply to eye.   Yes [provider]  acetaminophen (TYLENOL) 650 MG CR tablet Take 650 mg by mouth every 8 (eight) hours as needed for pain.    [provider]  amLODipine (NORVASC) 5 MG tablet Take 1 tablet (5 mg total) by mouth daily. Pt needs f/u appt w/PCP for further refills. 09/04/19   Silverio Decamp, MD  aspirin EC 81 MG tablet Take 1 tablet (81 mg total) by mouth daily. 11/30/15   Marcial Pacas, DO  Bromfenac Sodium (BROMSITE) 0.075 % SOLN Apply to eye.    [provider]  CALCIUM-MAGNESIUM-ZINC PO Take by mouth.    [provider]  cholecalciferol (VITAMIN D) 1000 UNITS tablet Take 2,000 Units by mouth daily.    [provider]  Cyanocobalamin (VITAMIN B 12 PO) Take by mouth.    [provider]  denosumab (PROLIA) 60 MG/ML SOLN injection Inject 60 mg into the skin every 6 (six) months. Administer in upper arm, thigh, or abdomen    [provider]  doxycycline (VIBRAMYCIN) 100 MG capsule Take 1 capsule (100 mg total) by mouth 2 (two) times daily. Take with food. 12/22/19   Kandra Nicolas, MD  magnesium gluconate (MAGONATE) 30 MG tablet Take 30 mg by mouth 2 (two) times daily.    [provider]  metoprolol succinate (TOPROL-XL) 50 MG 24 hr tablet Take 1 tablet (50 mg  total) by mouth daily. Take with or immediately following a meal. 08/09/19   Emeterio Reeve, DO  moxifloxacin (VIGAMOX) 0.5 % ophthalmic solution Place 1 drop into the left eye 3 (three) times a day.    [provider]  SUMAtriptan 6 MG/0.5ML SOAJ Inject 1 Syringe into the skin once. Inject 6 mg into skin at onset of headache one time 04/03/13   Schoenhoff, Altamese Cabal, MD  traMADol (ULTRAM) 50 MG tablet Take 1 tablet (50 mg total) by mouth every 8 (eight) hours as needed for moderate pain. Maximum 6 tabs per day. 03/04/19   Silverio Decamp, MD  vitamin C (ASCORBIC ACID) 500 MG tablet Take 500 mg by mouth daily.    [provider]  vitamin E (VITAMIN E) 400 UNIT  capsule Take 400 Units by mouth daily.    [provider]    Family History Family History  Problem Relation Age of Onset  . Alzheimer's disease Mother   . Heart disease Father   . Cancer Sister   . Diabetes Sister   . Heart disease Brother   . Diabetes Brother   . Cancer Brother     Social History Social History   Tobacco Use  . Smoking status: Never Smoker  . Smokeless tobacco: Never Used  Substance Use Topics  . Alcohol use: No  . Drug use: No     Allergies   Tape, Cortisone, Meloxicam, Ace inhibitors, Atorvastatin, Gabapentin, Losartan, and Vicodin [hydrocodone-acetaminophen]   Review of Systems Review of Systems  Constitutional: Negative for activity change, chills, diaphoresis, fatigue and fever.  HENT: Negative.   Eyes: Negative.   Respiratory: Negative.   Cardiovascular: Negative.   Gastrointestinal: Negative.   Genitourinary: Negative.   Musculoskeletal: Negative.   Skin: Positive for color change and rash.  Neurological: Negative.   Hematological: Negative.      Physical Exam Triage Vital Signs ED Triage Vitals [12/22/19 1018]  Enc Vitals Group     BP (!) 166/90     Pulse Rate 70     Resp 18     Temp 98.2 F (36.8 C)     Temp Source Oral     SpO2 100 %     Weight      Height      Head Circumference      Peak Flow      Pain Score      Pain Loc      Pain Edu?      Excl. in [place]?    No data found.  Updated Vital Signs BP (!) 166/90 (BP Location: Right Arm)   Pulse 70   Temp 98.2 F (36.8 C) (Oral)   Resp 18   SpO2 100%   Visual Acuity Right Eye Distance:   Left Eye Distance:   Bilateral Distance:    Right Eye Near:   Left Eye Near:    Bilateral Near:     Physical Exam Vitals and nursing note reviewed.  Constitutional:      General: She is not in acute distress. HENT:     Head: Normocephalic.     Nose: Nose normal.  Eyes:     Pupils: Pupils are equal, round, and reactive to light.  Cardiovascular:     Rate  and Rhythm: Normal rate.  Pulmonary:     Effort: Pulmonary effort is normal.  Abdominal:       Comments: Right abdomen has a 2cm diameter erythematous macule as noted on diagram. There  is no induration or fluctuance, and minimally tender to palpation.  Skin:    General: Skin is warm and dry.  Neurological:     Mental Status: She is alert.      UC Treatments / Results  Labs (all labs ordered are listed, but only abnormal results are displayed) Labs Reviewed - No data to display  EKG   Radiology No results found.  Procedures Procedures (including critical care time)  Medications Ordered in UC Medications - No data to display  Initial Impression / Assessment and Plan / UC Course  I have reviewed the triage vital signs and the nursing notes.  Pertinent labs & imaging results that were available during my care of the patient were reviewed by me and considered in my medical decision making (see chart for details).    Begin empiric doxycycline 100mg  BID for 10 days. Followup with Family Doctor if increased symptoms develop.   Final Clinical Impressions(s) / UC Diagnoses   Final diagnoses:  Tick bite, initial encounter     Discharge Instructions     May apply 1% hydrocortisone cream as needed for itching.    ED Prescriptions    Medication Sig Dispense Auth. Provider   doxycycline (VIBRAMYCIN) 100 MG capsule Take 1 capsule (100 mg total) by mouth 2 (two) times daily. Take with food. 20 capsule Kandra Nicolas, MD        Kandra Nicolas, MD 12/22/19 1050

## 2019-12-25 ENCOUNTER — Telehealth: Payer: Self-pay

## 2019-12-25 NOTE — Telephone Encounter (Signed)
Rexie called and left a message stating she is on Doxycycline due to a tick bite. She reports she has oral issues and dark urine. I called and left a message for patient to call and schedule an appointment.

## 2019-12-26 ENCOUNTER — Ambulatory Visit (INDEPENDENT_AMBULATORY_CARE_PROVIDER_SITE_OTHER): Payer: Medicare Other | Admitting: Osteopathic Medicine

## 2019-12-26 ENCOUNTER — Encounter: Payer: Self-pay | Admitting: Osteopathic Medicine

## 2019-12-26 ENCOUNTER — Other Ambulatory Visit: Payer: Self-pay

## 2019-12-26 VITALS — BP 161/76 | HR 65 | Temp 97.9°F | Wt 107.0 lb

## 2019-12-26 DIAGNOSIS — R8290 Unspecified abnormal findings in urine: Secondary | ICD-10-CM

## 2019-12-26 DIAGNOSIS — W57XXXA Bitten or stung by nonvenomous insect and other nonvenomous arthropods, initial encounter: Secondary | ICD-10-CM | POA: Diagnosis not present

## 2019-12-26 LAB — POCT URINALYSIS DIP (CLINITEK)
Bilirubin, UA: NEGATIVE
Blood, UA: NEGATIVE
Glucose, UA: NEGATIVE mg/dL
Ketones, POC UA: NEGATIVE mg/dL
Nitrite, UA: NEGATIVE
POC PROTEIN,UA: NEGATIVE
Spec Grav, UA: 1.01 (ref 1.010–1.025)
Urobilinogen, UA: 0.2 E.U./dL
pH, UA: 6 (ref 5.0–8.0)

## 2019-12-26 NOTE — Patient Instructions (Addendum)
Plan:  Given that the tick was definitely attached for less than 24 hours, you are very low risk of contracting any significant tickborne illnesses, but please be on the look out for any concerning rashes, fever, chills, joint pain, headache, or severe fatigue.  I think it would be fine to stop the antibiotics.  The rash where the tick was attached is more than likely an allergic reaction, similar to a mosquito bite.  Okay to continue over-the-counter creams for this if it is helping.  I think the urinary issue is more than likely an antibiotic side effect, let us monitor this over the weekend, if anything changes or gets worse, let us know!

## 2019-12-26 NOTE — Progress Notes (Signed)
Mercedes Decker is a 78 y.o. female who presents to  [place] at [facility]  today, 12/26/19, seeking care for the following:  Concern for itching rash after tick bite, no target lesion.  Tick was attached for less than 24 hours based on timeline of when she was outside working to when she removed the tick early the next morning.  Sought care at urgent care for this, they started antibiotics to take for 10 days.  Sounds like was identified as a [place] tick.  Concerned because is noticing dark/foul-smelling urine if she does not consistently drink water throughout the day.  Urinary frequency is bothersome to her but attributes this to increased water intake.     ASSESSMENT & PLAN with other pertinent history/findings:  The primary encounter diagnosis was Tick bite, initial encounter. A diagnosis of Foul smelling urine was also pertinent to this visit.   Patient Instructions  Plan:  Given that the tick was definitely attached for less than 24 hours, you are very low risk of contracting any significant tickborne illnesses, but please be on the look out for any concerning rashes, fever, chills, joint pain, headache, or severe fatigue.  I think it would be fine to stop the antibiotics.  The rash where the tick was attached is more than likely an allergic reaction, similar to a mosquito bite.  Okay to continue over-the-counter creams for this if it is helping.  I think the urinary issue is more than likely an antibiotic side effect, let us monitor this over the weekend, if anything changes or gets worse, let us know!    Orders Placed This Encounter  Procedures  . Urine Culture  . MICROSCOPIC MESSAGE  . Urinalysis, Routine w reflex microscopic  . POCT URINALYSIS DIP (CLINITEK)         Follow-up instructions: Return if symptoms worsen or fail to  improve.                                              BP (!) 161/76 (BP Location: Left Arm, Patient Position: Sitting, Cuff Size: Normal)   Pulse 65   Temp 97.9 F (36.6 C) (Oral)   Wt 107 lb 0.6 oz (48.6 kg)   BMI 22.37 kg/m   Current Meds  Medication Sig  . acetaminophen (TYLENOL) 650 MG CR tablet Take 650 mg by mouth every 8 (eight) hours as needed for pain.  Marland Kitchen amLODipine (NORVASC) 5 MG tablet Take 1 tablet (5 mg total) by mouth daily. Pt needs f/u appt w/PCP for further refills.  Marland Kitchen aspirin EC 81 MG tablet Take 1 tablet (81 mg total) by mouth daily.  . Bromfenac Sodium (BROMSITE) 0.075 % SOLN Apply to eye.  Marland Kitchen CALCIUM-MAGNESIUM-ZINC PO Take by mouth.  . cholecalciferol (VITAMIN D) 1000 UNITS tablet Take 2,000 Units by mouth daily.  . Cyanocobalamin (VITAMIN B 12 PO) Take by mouth.  . denosumab (PROLIA) 60 MG/ML SOLN injection Inject 60 mg into the skin every 6 (six) months. Administer in upper arm, thigh, or abdomen  . doxycycline (VIBRAMYCIN) 100 MG capsule Take 1 capsule (100 mg total) by mouth 2 (two) times daily. Take with food.  Marland Kitchen Lifitegrast (XIIDRA) 5 % SOLN Apply to eye.  . magnesium gluconate (MAGONATE) 30 MG tablet Take 30 mg by mouth 2 (two) times daily.  Marland Kitchen  metoprolol succinate (TOPROL-XL) 50 MG 24 hr tablet Take 1 tablet (50 mg total) by mouth daily. Take with or immediately following a meal.  . moxifloxacin (VIGAMOX) 0.5 % ophthalmic solution Place 1 drop into the left eye 3 (three) times a day.  . SUMAtriptan 6 MG/0.5ML SOAJ Inject 1 Syringe into the skin once. Inject 6 mg into skin at onset of headache one time  . traMADol (ULTRAM) 50 MG tablet Take 1 tablet (50 mg total) by mouth every 8 (eight) hours as needed for moderate pain. Maximum 6 tabs per day.  . vitamin C (ASCORBIC ACID) 500 MG tablet Take 500 mg by mouth daily.  . vitamin E (VITAMIN E) 400 UNIT capsule Take 400 Units by mouth daily.    Results for orders placed  or performed in visit on 12/26/19 (from the past 72 hour(s))  Urinalysis, Routine w reflex microscopic     Status: Abnormal   Collection Time: 12/26/19  1:24 PM  Result Value Ref Range   Color, Urine YELLOW YELLOW   APPearance CLEAR CLEAR   Specific Gravity, Urine 1.008 1.001 - 1.03   pH 6.0 5.0 - 8.0   Glucose, UA NEGATIVE NEGATIVE   Bilirubin Urine NEGATIVE NEGATIVE   Ketones, ur NEGATIVE NEGATIVE   Hgb urine dipstick NEGATIVE NEGATIVE   Protein, ur NEGATIVE NEGATIVE   Nitrite NEGATIVE NEGATIVE   Leukocytes,Ua 1+ (A) NEGATIVE   WBC, UA 0-5 0 - 5 /HPF   RBC / HPF NONE SEEN 0 - 2 /HPF   Squamous Epithelial / LPF NONE SEEN < OR = 5 /HPF   Bacteria, UA NONE SEEN NONE SEEN /HPF   Hyaline Cast NONE SEEN NONE SEEN /LPF  POCT URINALYSIS DIP (CLINITEK)     Status: Abnormal   Collection Time: 12/26/19  1:37 PM  Result Value Ref Range   Color, UA yellow yellow   Clarity, UA clear clear   Glucose, UA negative negative mg/dL   Bilirubin, UA negative negative   Ketones, POC UA negative negative mg/dL   Spec Grav, UA 1.010 1.010 - 1.025   Blood, UA negative negative   pH, UA 6.0 5.0 - 8.0   POC PROTEIN,UA negative negative, trace   Urobilinogen, UA 0.2 0.2 or 1.0 E.U./dL   Nitrite, UA Negative Negative   Leukocytes, UA Small (1+) (A) Negative    No results found.  All questions at time of visit were answered - patient instructed to contact office with any additional concerns or updates.  ER/RTC precautions were reviewed with the patient.  Please note: voice recognition software was used to produce this document, and typos may escape review. Please contact Dr. Sheppard Coil for any needed clarifications.

## 2019-12-27 LAB — URINALYSIS, ROUTINE W REFLEX MICROSCOPIC
Bacteria, UA: NONE SEEN /HPF
Bilirubin Urine: NEGATIVE
Glucose, UA: NEGATIVE
Hgb urine dipstick: NEGATIVE
Hyaline Cast: NONE SEEN /LPF
Ketones, ur: NEGATIVE
Nitrite: NEGATIVE
Protein, ur: NEGATIVE
RBC / HPF: NONE SEEN /HPF (ref 0–2)
Specific Gravity, Urine: 1.008 (ref 1.001–1.03)
Squamous Epithelial / HPF: NONE SEEN /HPF (ref <=5)
pH: 6 (ref 5.0–8.0)

## 2019-12-27 LAB — URINE CULTURE
MICRO NUMBER:: 10421810
Result:: NO GROWTH
SPECIMEN QUALITY:: ADEQUATE

## 2019-12-31 DIAGNOSIS — M7121 Synovial cyst of popliteal space [Baker], right knee: Secondary | ICD-10-CM | POA: Diagnosis not present

## 2019-12-31 DIAGNOSIS — R2241 Localized swelling, mass and lump, right lower limb: Secondary | ICD-10-CM | POA: Diagnosis not present

## 2019-12-31 DIAGNOSIS — I739 Peripheral vascular disease, unspecified: Secondary | ICD-10-CM | POA: Diagnosis not present

## 2020-01-02 DIAGNOSIS — C44629 Squamous cell carcinoma of skin of left upper limb, including shoulder: Secondary | ICD-10-CM | POA: Diagnosis not present

## 2020-01-02 DIAGNOSIS — L578 Other skin changes due to chronic exposure to nonionizing radiation: Secondary | ICD-10-CM | POA: Diagnosis not present

## 2020-01-06 DIAGNOSIS — D225 Melanocytic nevi of trunk: Secondary | ICD-10-CM | POA: Diagnosis not present

## 2020-01-06 DIAGNOSIS — L578 Other skin changes due to chronic exposure to nonionizing radiation: Secondary | ICD-10-CM | POA: Diagnosis not present

## 2020-01-06 DIAGNOSIS — L820 Inflamed seborrheic keratosis: Secondary | ICD-10-CM | POA: Diagnosis not present

## 2020-01-06 DIAGNOSIS — D485 Neoplasm of uncertain behavior of skin: Secondary | ICD-10-CM | POA: Diagnosis not present

## 2020-01-06 DIAGNOSIS — L814 Other melanin hyperpigmentation: Secondary | ICD-10-CM | POA: Diagnosis not present

## 2020-01-06 DIAGNOSIS — D1801 Hemangioma of skin and subcutaneous tissue: Secondary | ICD-10-CM | POA: Diagnosis not present

## 2020-01-20 DIAGNOSIS — M4807 Spinal stenosis, lumbosacral region: Secondary | ICD-10-CM | POA: Diagnosis not present

## 2020-01-20 DIAGNOSIS — M5136 Other intervertebral disc degeneration, lumbar region: Secondary | ICD-10-CM | POA: Diagnosis not present

## 2020-01-20 DIAGNOSIS — M5416 Radiculopathy, lumbar region: Secondary | ICD-10-CM | POA: Diagnosis not present

## 2020-01-23 DIAGNOSIS — D485 Neoplasm of uncertain behavior of skin: Secondary | ICD-10-CM | POA: Diagnosis not present

## 2020-02-26 DIAGNOSIS — Z5112 Encounter for antineoplastic immunotherapy: Secondary | ICD-10-CM | POA: Diagnosis not present

## 2020-02-26 DIAGNOSIS — I10 Essential (primary) hypertension: Secondary | ICD-10-CM | POA: Diagnosis not present

## 2020-02-26 DIAGNOSIS — K219 Gastro-esophageal reflux disease without esophagitis: Secondary | ICD-10-CM | POA: Diagnosis not present

## 2020-02-26 DIAGNOSIS — M818 Other osteoporosis without current pathological fracture: Secondary | ICD-10-CM | POA: Diagnosis not present

## 2020-02-26 DIAGNOSIS — Z9081 Acquired absence of spleen: Secondary | ICD-10-CM | POA: Diagnosis not present

## 2020-03-30 ENCOUNTER — Ambulatory Visit (INDEPENDENT_AMBULATORY_CARE_PROVIDER_SITE_OTHER): Payer: Medicare Other | Admitting: Medical-Surgical

## 2020-03-30 ENCOUNTER — Encounter: Payer: Self-pay | Admitting: Medical-Surgical

## 2020-03-30 ENCOUNTER — Other Ambulatory Visit: Payer: Self-pay

## 2020-03-30 VITALS — BP 154/53 | HR 66 | Temp 98.0°F | Wt 108.0 lb

## 2020-03-30 DIAGNOSIS — J069 Acute upper respiratory infection, unspecified: Secondary | ICD-10-CM | POA: Diagnosis not present

## 2020-03-30 MED ORDER — BENZONATATE 100 MG PO CAPS: 100.0000 mg | ORAL_CAPSULE | Freq: Three times a day (TID) | ORAL | 0 refills | Status: DC | PRN

## 2020-03-30 MED ORDER — IPRATROPIUM BROMIDE 0.03 % NA SOLN: 2.0000 | Freq: Two times a day (BID) | NASAL | 0 refills | Status: DC

## 2020-03-30 MED ORDER — BENZONATATE 100 MG PO CAPS: 100.0000 mg | ORAL_CAPSULE | Freq: Three times a day (TID) | ORAL | 0 refills | Status: AC | PRN

## 2020-03-30 NOTE — Patient Instructions (Addendum)
Medications & Home Remedies for Upper Respiratory Illness   Note: the following list assumes no pregnancy, normal liver & kidney function and no other drug interactions. I have included medications which are safe for you to use, but these may not be appropriate for everyone. Always ask a pharmacist or qualified medical provider if you have any questions!    Aches/Pains, Fever, Headache OTC Acetaminophen (Tylenol) 500 mg tablets - take max 2 tablets (1000 mg) every 6 hours (4 times per day)  OTC Ibuprofen (Motrin) 200 mg tablets - take max 4 tablets (800 mg) every 6 hours*   Sinus Congestion Prescription Atrovent as directed OTC Nasal Saline if desired to rinse OTC Oxymetolazone (Afrin, others) sparing use due to rebound congestion, NEVER use in kids OTC Diphenhydramine (Benadryl) 25 mg tablets - take max 2 tablets every 4 hours   Cough & Sore Throat Prescription cough pills or syrups as directed OTC Dextromethorphan (Robitussin, others) - cough suppressant OTC Guaifenesin (Robitussin, Mucinex, others) - expectorant (helps cough up mucus) (Dextromethorphan and Guaifenesin also come in a combination tablet/syrup) OTC Lozenges w/ Benzocaine + Menthol (Cepacol) Honey - as much as you want! Teas which "coat the throat" - look for ingredients Elm Bark, Licorice Root, Marshmallow Root   Other Prescription Oral Steroids to decrease inflammation and improve energy  OTC Zinc Lozenges within 24 hours of symptoms onset - mixed evidence this shortens the duration of the common cold Don't waste your money on Vitamin C or Echinacea in acute illness - it's already too late!    *Caution in patients with high blood pressure

## 2020-03-30 NOTE — Progress Notes (Signed)
Subjective:    CC: Fatigue, cough, chills  HPI: Pleasant 79 year old female presenting today with reports of 4 days of fatigue with intermittent chills and a dry cough.  Has had mild to moderate sore throat and feels like she is "losing her voice".  Also notes that her eyes have been very watery and she has experienced a runny nose.  She does have some body aches and mild nausea.  Her appetite has been poor.  Her ears feel "clogged" but no ear pain or hearing loss.  She has had no sinus congestion.  She had both Covid vaccines and has not been around any known sick contacts.  Notes that her sister did have a cough on Sunday when she was leaving the home but no other symptoms.  Has been taking TheraFlu which helps her sleep at night but has not helped resolved her symptoms.  T-max 98.8 a couple of days ago, no other fevers.  No diarrhea or vomiting.  I reviewed the past medical history, family history, social history, surgical history, and allergies today and no changes were needed.  Please see the problem list section below in epic for further details.  Past Medical History: Past Medical History:  Diagnosis Date  . Arthritis   . Hypertension   . Migraine   . Osteoporosis   . Prediabetes 08/28/2018   Past Surgical History: Past Surgical History:  Procedure Laterality Date  . ABDOMINAL HYSTERECTOMY    . BACK SURGERY    . BREAST SURGERY    . BUNIONECTOMY    . INCISIONAL HERNIA REPAIR    . KNEE ARTHROSCOPY     Social History: Social History   Socioeconomic History  . Marital status: Married    Spouse name: Purcell Nails  . Number of children: 1  . Years of education: 53  . Highest education level: 12th grade  Occupational History  . Occupation: Firefighter    Comment: retired  Tobacco Use  . Smoking status: Never Smoker  . Smokeless tobacco: Never Used  Vaping Use  . Vaping Use: Never used  Substance and Sexual Activity  . Alcohol use: No  . Drug use: No  . Sexual  activity: Yes    Birth control/protection: Post-menopausal  Other Topics Concern  . Not on file  Social History Narrative   Retired. Works around [employer]. Walks daily. No caffeine   Social Determinants of Radio broadcast assistant Strain:   . Difficulty of Paying Living Expenses:   Food Insecurity:   . Worried About Charity fundraiser in the Last Year:   . Arboriculturist in the Last Year:   Transportation Needs:   . Film/video editor (Medical):   Marland Kitchen Lack of Transportation (Non-Medical):   Physical Activity:   . Days of Exercise per Week:   . Minutes of Exercise per Session:   Stress:   . Feeling of Stress :   Social Connections:   . Frequency of Communication with Friends and Family:   . Frequency of Social Gatherings with Friends and Family:   . Attends Religious Services:   . Active Member of Clubs or Organizations:   . Attends Archivist Meetings:   Marland Kitchen Marital Status:    Family History: Family History  Problem Relation Age of Onset  . Alzheimer's disease Mother   . Heart disease Father   . Cancer Sister   . Diabetes Sister   . Heart disease Brother   . Diabetes Brother   .  Cancer Brother    Allergies: Allergies  Allergen Reactions  . Tape Other (See Comments)    BANDAIDS AND ADHESIVE TAPES TEAR SKIN (VERY THIN SKIN)  REQUESTING HYPOALLERGENIC TAPE.  "OUCHLESS TAPE." IS OK   . Cortisone Other (See Comments)    Flushing and worsening joint pain   . Meloxicam Other (See Comments)    Too sleepy   . Ace Inhibitors Cough  . Atorvastatin Other (See Comments)    Possible allergy  . Gabapentin Other (See Comments)    Caused leg cramping  . Losartan Other (See Comments)    Sweats, cramps  . Vicodin [Hydrocodone-Acetaminophen] Other (See Comments)    Pt states that she stops breathing when she takes  Pain meds   Medications: See med rec.  Review of Systems: See HPI for pertinent positives and negatives.   Objective:    General: Well  Developed, well nourished, and in no acute distress.  Neuro: Alert and oriented x3.  HEENT: Normocephalic, atraumatic.  Bilateral external ear canals patent, TMs normal.  Nasal turbinates mildly erythematous.  Posterior oropharynx mildly erythematous without exudate.  Mild tenderness to submandibular lymph nodes but no other lymphadenopathy Skin: Warm and dry. Cardiac: Regular rate and rhythm, no murmurs rubs or gallops, no lower extremity edema.  Respiratory: Clear to auscultation bilaterally. Not using accessory muscles, speaking in full sentences.   Impression and Recommendations:    1. Acute URI Symptoms likely related to a viral upper respiratory infection.  May continue over-the-counter treatment with TheraFlu or other agent as desired.  Sending Atrovent nasal spray twice daily to help with nasal drainage.  Sending Tessalon Perles 3 times daily as needed to help with cough.  Upper respiratory OTC shopping list provided with AVS.  Patient counseled on appropriate transmission prevention and quarantine recommendations.  Covid test performed in office. - ipratropium (ATROVENT) 0.03 % nasal spray; Place 2 sprays into both nostrils every 12 (twelve) hours.  Dispense: 30 mL; Refill: 0 - benzonatate (TESSALON) 100 MG capsule; Take 1 capsule (100 mg total) by mouth 3 (three) times daily as needed for up to 7 days for cough.  Dispense: 21 capsule; Refill: 0 - Novel Coronavirus, NAA ([facility])  Return if symptoms worsen or fail to improve. ___________________________________________ Clearnce Sorrel, DNP, APRN, FNP-BC Primary Care and [place]

## 2020-04-01 LAB — NOVEL CORONAVIRUS, NAA: SARS-CoV-2, NAA: NOT DETECTED

## 2020-04-01 LAB — SARS-COV-2, NAA 2 DAY TAT

## 2020-04-28 ENCOUNTER — Other Ambulatory Visit: Payer: Self-pay | Admitting: Sports Medicine

## 2020-04-28 DIAGNOSIS — I10 Essential (primary) hypertension: Secondary | ICD-10-CM

## 2020-05-14 DIAGNOSIS — L821 Other seborrheic keratosis: Secondary | ICD-10-CM | POA: Diagnosis not present

## 2020-05-14 DIAGNOSIS — D485 Neoplasm of uncertain behavior of skin: Secondary | ICD-10-CM | POA: Diagnosis not present

## 2020-05-14 DIAGNOSIS — D225 Melanocytic nevi of trunk: Secondary | ICD-10-CM | POA: Diagnosis not present

## 2020-05-25 ENCOUNTER — Encounter: Payer: Self-pay | Admitting: Sports Medicine

## 2020-05-25 ENCOUNTER — Ambulatory Visit (INDEPENDENT_AMBULATORY_CARE_PROVIDER_SITE_OTHER): Payer: Medicare Other | Admitting: Sports Medicine

## 2020-05-25 ENCOUNTER — Other Ambulatory Visit: Payer: Self-pay

## 2020-05-25 ENCOUNTER — Ambulatory Visit (INDEPENDENT_AMBULATORY_CARE_PROVIDER_SITE_OTHER): Payer: Medicare Other

## 2020-05-25 DIAGNOSIS — W1789XA Other fall from one level to another, initial encounter: Secondary | ICD-10-CM

## 2020-05-25 DIAGNOSIS — R0781 Pleurodynia: Secondary | ICD-10-CM | POA: Diagnosis not present

## 2020-05-25 DIAGNOSIS — S20229A Contusion of unspecified back wall of thorax, initial encounter: Secondary | ICD-10-CM | POA: Insufficient documentation

## 2020-05-25 DIAGNOSIS — W2209XA Striking against other stationary object, initial encounter: Secondary | ICD-10-CM | POA: Diagnosis not present

## 2020-05-25 DIAGNOSIS — S20222A Contusion of left back wall of thorax, initial encounter: Secondary | ICD-10-CM | POA: Diagnosis not present

## 2020-05-25 DIAGNOSIS — M545 Low back pain: Secondary | ICD-10-CM | POA: Diagnosis not present

## 2020-05-25 MED ORDER — TRAMADOL HCL 50 MG PO TABS: 50.0000 mg | ORAL_TABLET | Freq: Three times a day (TID) | ORAL | 0 refills | Status: DC | PRN

## 2020-05-25 NOTE — Progress Notes (Signed)
    Procedures performed today:    None.  Independent interpretation of notes and tests performed by another provider:   None.  Brief History, Exam, Impression, and Recommendations:    Mercedes Decker is a pleasant 79yo female who presents today with left lower back pain after a fall in which she hit her lateral lower back on two car jacks after she fell off her seat while helping her husband fix his tractor. She had a lot of pain acutely that has improved over time with tylenol and rest. She is still reporting pain in the quadratus and along the posterior left 11th and 12th ribs. She has a small scar along the lateral aspect of her quadratus from where the care jack impacted. She also has some nodules around the scar. We are going to get Xrays of her ribs and pelvis to r/o fracture. Since the pain has been improving with NSAIDs and rest we are going to start conservatively with meloxicam, tramadol, and rest. We will reevaluate her in 4-6 weeks. If she is no better, further imaging may be needed.   Marcelino Duster, MS3   ___________________________________________ Gwen Her. Dianah Field, M.D., ABFM., CAQSM. Primary [facility] and [place] Instructor of [place] [employer] [facility]

## 2020-05-25 NOTE — Assessment & Plan Note (Signed)
This is a pleasant 79 year old female, she recently had a fall onto a jack stand. She immediate pain, she does have a scratch in her back, minimal tenderness over her lower left floating ribs as well as her left iliac crest. She has some Tylenol and meloxicam, which she will continue this and use some tramadol for breakthrough pain. Getting x-rays of her left ribs as well as her iliac crest, she can massage the area daily, return to see me as needed.

## 2020-06-02 DIAGNOSIS — Z23 Encounter for immunization: Secondary | ICD-10-CM | POA: Diagnosis not present

## 2020-06-11 NOTE — Telephone Encounter (Signed)
This encounter was created in error - please disregard.

## 2020-06-17 DIAGNOSIS — H04561 Stenosis of right lacrimal punctum: Secondary | ICD-10-CM | POA: Diagnosis not present

## 2020-06-17 DIAGNOSIS — H04562 Stenosis of left lacrimal punctum: Secondary | ICD-10-CM | POA: Diagnosis not present

## 2020-06-17 DIAGNOSIS — H16223 Keratoconjunctivitis sicca, not specified as Sjogren's, bilateral: Secondary | ICD-10-CM | POA: Diagnosis not present

## 2020-07-03 DIAGNOSIS — H04561 Stenosis of right lacrimal punctum: Secondary | ICD-10-CM | POA: Diagnosis not present

## 2020-07-03 DIAGNOSIS — H04562 Stenosis of left lacrimal punctum: Secondary | ICD-10-CM | POA: Diagnosis not present

## 2020-07-06 ENCOUNTER — Ambulatory Visit (INDEPENDENT_AMBULATORY_CARE_PROVIDER_SITE_OTHER): Payer: Medicare Other | Admitting: Osteopathic Medicine

## 2020-07-06 ENCOUNTER — Other Ambulatory Visit: Payer: Self-pay

## 2020-07-06 VITALS — Temp 98.0°F | Ht <= 58 in

## 2020-07-06 DIAGNOSIS — Z23 Encounter for immunization: Secondary | ICD-10-CM

## 2020-07-06 NOTE — Addendum Note (Signed)
Addended by: Franchot Gallo on: 07/06/2020 09:38 AM   Modules accepted: Level of Service

## 2020-07-06 NOTE — Progress Notes (Signed)
Nurse visit flu shot only. Temp 98. Patient declined high dose vac for age group.

## 2020-07-20 DIAGNOSIS — H524 Presbyopia: Secondary | ICD-10-CM | POA: Diagnosis not present

## 2020-07-20 DIAGNOSIS — H02885 Meibomian gland dysfunction left lower eyelid: Secondary | ICD-10-CM | POA: Diagnosis not present

## 2020-08-17 ENCOUNTER — Other Ambulatory Visit: Payer: Self-pay

## 2020-08-17 DIAGNOSIS — I10 Essential (primary) hypertension: Secondary | ICD-10-CM

## 2020-08-17 MED ORDER — METOPROLOL SUCCINATE ER 50 MG PO TB24: 50.0000 mg | ORAL_TABLET | Freq: Every day | ORAL | 1 refills | Status: DC

## 2020-10-30 ENCOUNTER — Other Ambulatory Visit: Payer: Self-pay | Admitting: Osteopathic Medicine

## 2020-10-30 DIAGNOSIS — I10 Essential (primary) hypertension: Secondary | ICD-10-CM

## 2020-12-01 ENCOUNTER — Ambulatory Visit (INDEPENDENT_AMBULATORY_CARE_PROVIDER_SITE_OTHER): Payer: Medicare Other | Admitting: Osteopathic Medicine

## 2020-12-01 ENCOUNTER — Other Ambulatory Visit: Payer: Self-pay

## 2020-12-01 ENCOUNTER — Encounter: Payer: Self-pay | Admitting: Osteopathic Medicine

## 2020-12-01 VITALS — BP 127/76 | HR 59 | Temp 98.0°F | Wt 109.0 lb

## 2020-12-01 DIAGNOSIS — R6881 Early satiety: Secondary | ICD-10-CM

## 2020-12-01 DIAGNOSIS — Z9081 Acquired absence of spleen: Secondary | ICD-10-CM | POA: Diagnosis not present

## 2020-12-01 DIAGNOSIS — R0789 Other chest pain: Secondary | ICD-10-CM | POA: Diagnosis not present

## 2020-12-01 DIAGNOSIS — R110 Nausea: Secondary | ICD-10-CM | POA: Diagnosis not present

## 2020-12-01 DIAGNOSIS — R1012 Left upper quadrant pain: Secondary | ICD-10-CM | POA: Diagnosis not present

## 2020-12-01 NOTE — Patient Instructions (Signed)
Will get labs and CT scan today to evaluate left upper abdomen. This may be scar tissue from previous surgery, which we may or may not see on CT. Will also refer to GI to evaluate the stomach given the early satiety (feeling full) and chest discomfort - this may require an endoscopy to further evaluate.   EKG today looks okay, other than heart rate a bit on the slow side but not concerning. Given chest pains and risk factors, I'd like cardiologist opinion regarding need for stress test or other evaluation.

## 2020-12-01 NOTE — Progress Notes (Signed)
Mercedes Decker is a 80 y.o. female who presents to  [place] at [facility]  today, 12/01/20, seeking care for the following:   L upper quadrant abdominal pain x3 mos, feels like "something is there." S/p splenectomy. Assoc w/ nausea and early satiety. No diarrhea/ no constipation, no blood in stool. On exam, (+)tenderness bu tno rebound/guarding in LUQ, BS WNL, no hepatomegaly, no LUQ mass. (+)epigastric tenderness as well. Hx GERDbut this feels different.   Chest pain: feels tightness in chest, not reproduced on exertion but notes especially after working out in garden and feels tight when she comes inside to take a break. No allergies/asthma. No dizziness, no SOB, no LOC/presyncope. On exam, RRR, S1S2 WNL. Lungs CTABL. On EKG:   Occasional numbness in feet, esp 1st toes bilaterally, No falls. Seen in our office by sports med 05/25/20 for L low back pain following a fall, on XR (+)DD L2-3 Seen by orthopedics at [facility] 01/20/20 for lumbar DDD, MRI L spine 10/26/19 (+) "Previous left-sided laminectomy at L5-S1. There are multilevel mild degenerative changes, but no disc herniation, spinal stenosis or other specific cause for a radiculopathy. Mild degenerative disc disease at L2-L3 has progressed since the prior study from 2009. Otherwise no significant interval change."     ASSESSMENT & PLAN with other pertinent findings:  The primary encounter diagnosis was Left upper quadrant abdominal pain. Diagnoses of History of splenectomy, Early satiety, Nausea, and Other chest pain were also pertinent to this visit.    Patient Instructions  Will get labs and CT scan today to evaluate left upper abdomen. This may be scar tissue from previous surgery, which we may or may not see on CT. Will also refer to GI to evaluate the stomach given the early satiety (feeling full) and chest discomfort - this may require an endoscopy to further evaluate.   EKG today  looks okay, other than heart rate a bit on the slow side but not concerning. Given chest pains and risk factors, I'd like cardiologist opinion regarding need for stress test or other evaluation.      Orders Placed This Encounter  Procedures  . CT Abdomen Pelvis W Contrast  . CBC with Differential/Platelet  . COMPLETE METABOLIC PANEL WITH GFR  . Lipase  . Ambulatory referral to Gastroenterology  . Ambulatory referral to Cardiology  . EKG 12-Lead    No orders of the defined types were placed in this encounter.    See below for relevant physical exam findings  See below for recent lab and imaging results reviewed  Medications, allergies, PMH, PSH, SocH, FamH reviewed below    Follow-up instructions: Return for RECHECK PENDING RESULTS / IF WORSE OR CHANGE.                                        Exam:  BP 127/76 (BP Location: Left Arm, Patient Position: Sitting, Cuff Size: Normal)   Pulse (!) 59   Temp 98 F (36.7 C) (Oral)   Wt 109 lb (49.4 kg)   BMI 22.78 kg/m   Constitutional: VS see above. General Appearance: alert, well-developed, well-nourished, NAD  Neck: No masses, trachea midline.   Respiratory: Normal respiratory effort. no wheeze, no rhonchi, no rales  Cardiovascular: S1/S2 normal, no murmur, no rub/gallop auscultated. RRR.   Musculoskeletal: Gait normal. Symmetric and independent movement of all extremities  Abdominal: (+)  epigastric and LUQ tenderness, non-distended, no appreciable organomegaly, neg Murphy's, BS WNLx4  Neurological: Normal balance/coordination. No tremor.  Skin: warm, dry, intact.   Psychiatric: Normal judgment/insight. Normal mood and affect. Oriented x3.   No outpatient medications have been marked as taking for the 12/01/20 encounter (Office Visit) with Emeterio Reeve, DO.    Allergies  Allergen Reactions  . Tape Other (See Comments)    BANDAIDS AND ADHESIVE TAPES TEAR SKIN (VERY THIN SKIN)   REQUESTING HYPOALLERGENIC TAPE.  "OUCHLESS TAPE." IS OK   . Cortisone Other (See Comments)    Flushing and worsening joint pain   . Meloxicam Other (See Comments)    Too sleepy   . Ace Inhibitors Cough  . Atorvastatin Other (See Comments)    Possible allergy  . Gabapentin Other (See Comments)    Caused leg cramping  . Losartan Other (See Comments)    Sweats, cramps  . Vicodin [Hydrocodone-Acetaminophen] Other (See Comments)    Pt states that she stops breathing when she takes  Pain meds    Patient Active Problem List   Diagnosis Date Noted  . Contusion, back 05/25/2020  . Neurogenic claudication due to lumbar spinal stenosis 03/04/2019  . Prediabetes 08/28/2018  . Fall 02/16/2018  . Primary osteoarthritis of both knees 11/21/2017  . Greater trochanteric bursitis of right hip 10/23/2017  . Pancreatic tumor 05/08/2017  . Hoarseness 02/10/2017  . Right shoulder injury 08/18/2016  . TIA (transient ischemic attack) 03/08/2016  . Right hand pain 03/07/2016  . Patient has active power of attorney for health care 11/30/2015  . Essential hypertension 12/25/2014  . Murmur, cardiac 09/11/2012  . DJD (degenerative joint disease) 09/03/2012  . S/P hysterectomy 09/03/2012  . Osteoporosis  L fem neck  Dexa 07/2014  -3.1 09/03/2012  . Migraine 09/03/2012  . History of anemia 09/03/2012  . Mixed hyperlipidemia 09/03/2012  . GERD (gastroesophageal reflux disease) 09/03/2012    Family History  Problem Relation Age of Onset  . Alzheimer's disease Mother   . Heart disease Father   . Cancer Sister   . Diabetes Sister   . Heart disease Brother   . Diabetes Brother   . Cancer Brother     Social History   Tobacco Use  Smoking Status Never Smoker  Smokeless Tobacco Never Used    Past Surgical History:  Procedure Laterality Date  . ABDOMINAL HYSTERECTOMY    . BACK SURGERY    . BREAST SURGERY    . BUNIONECTOMY    . INCISIONAL HERNIA REPAIR    . KNEE ARTHROSCOPY       Immunization History  Administered Date(s) Administered  . Fluad Quad(high Dose 65+) 04/29/2019  . HiB (PRP-OMP) 07/04/2017  . Influenza Split 05/29/2010, 05/18/2011, 05/15/2012  . Influenza, High Dose Seasonal PF 04/26/2016, 05/06/2017, 07/04/2017, 05/03/2018  . Influenza,inj,Quad PF,6+ Mos 07/06/2020  . Influenza-Unspecified 05/29/2010, 05/18/2011, 05/15/2012, 05/29/2013, 05/28/2014, 05/01/2015, 06/29/2016  . Meningococcal Conjugate 11/07/2017, 01/02/2018  . PFIZER(Purple Top)SARS-COV-2 Vaccination 09/19/2019, 10/10/2019, 06/02/2020  . Pneumococcal Conjugate-13 04/26/2016, 07/04/2017  . Pneumococcal Polysaccharide-23 03/30/2007, 11/07/2017  . Pneumococcal-Unspecified 03/30/2007  . Tdap 04/19/2008  . Zoster 08/29/2008    No results found for this or any previous visit (from the past 2160 hour(s)).  No results found.     All questions at time of visit were answered - patient instructed to contact office with any additional concerns or updates. ER/RTC precautions were reviewed with the patient as applicable.   Please note: manual typing as well as voice recognition  software may have been used to produce this document - typos may escape review. Please contact Dr. Sheppard Coil for any needed clarifications.   Total encounter time on date of service, 12/01/20, was 40 minutes spent addressing problems/issues as noted above in [place], including time spent in discussion with patient regarding the HPI, ROS, confirming history, reviewing Assessment & Plan, as well as time spent on coordination of care, record review.

## 2020-12-02 LAB — CBC WITH DIFFERENTIAL/PLATELET
Absolute Monocytes: 792 cells/uL (ref 200–950)
Basophils Absolute: 104 cells/uL (ref 0–200)
Basophils Relative: 1.3 %
Eosinophils Absolute: 264 cells/uL (ref 15–500)
Eosinophils Relative: 3.3 %
HCT: 35 % (ref 35.0–45.0)
Hemoglobin: 11.8 g/dL (ref 11.7–15.5)
Lymphs Abs: 3768 cells/uL (ref 850–3900)
MCH: 30.3 pg (ref 27.0–33.0)
MCHC: 33.7 g/dL (ref 32.0–36.0)
MCV: 90 fL (ref 80.0–100.0)
MPV: 10.7 fL (ref 7.5–12.5)
Monocytes Relative: 9.9 %
Neutro Abs: 3072 cells/uL (ref 1500–7800)
Neutrophils Relative %: 38.4 %
Platelets: 331 10*3/uL (ref 140–400)
RBC: 3.89 10*6/uL (ref 3.80–5.10)
RDW: 12.8 % (ref 11.0–15.0)
Total Lymphocyte: 47.1 %
WBC: 8 10*3/uL (ref 3.8–10.8)

## 2020-12-02 LAB — COMPLETE METABOLIC PANEL WITH GFR
AG Ratio: 1.3 (calc) (ref 1.0–2.5)
ALT: 18 U/L (ref 6–29)
AST: 21 U/L (ref 10–35)
Albumin: 4.1 g/dL (ref 3.6–5.1)
Alkaline phosphatase (APISO): 69 U/L (ref 37–153)
BUN/Creatinine Ratio: 16 (calc) (ref 6–22)
BUN: 15 mg/dL (ref 7–25)
CO2: 27 mmol/L (ref 20–32)
Calcium: 9.9 mg/dL (ref 8.6–10.4)
Chloride: 106 mmol/L (ref 98–110)
Creat: 0.92 mg/dL — ABNORMAL HIGH (ref 0.60–0.88)
GFR, Est African American: 68 mL/min/{1.73_m2} (ref >=60)
GFR, Est Non African American: 58 mL/min/{1.73_m2} — ABNORMAL LOW (ref >=60)
Globulin: 3.1 g/dL (calc) (ref 1.9–3.7)
Glucose, Bld: 130 mg/dL — ABNORMAL HIGH (ref 65–99)
Potassium: 5.2 mmol/L (ref 3.5–5.3)
Sodium: 141 mmol/L (ref 135–146)
Total Bilirubin: 0.3 mg/dL (ref 0.2–1.2)
Total Protein: 7.2 g/dL (ref 6.1–8.1)

## 2020-12-02 LAB — LIPASE: Lipase: 29 U/L (ref 7–60)

## 2020-12-03 ENCOUNTER — Encounter (HOSPITAL_BASED_OUTPATIENT_CLINIC_OR_DEPARTMENT_OTHER): Payer: Self-pay

## 2020-12-03 ENCOUNTER — Ambulatory Visit (HOSPITAL_BASED_OUTPATIENT_CLINIC_OR_DEPARTMENT_OTHER)
Admission: RE | Admit: 2020-12-03 | Discharge: 2020-12-03 | Disposition: A | Discharge status: Home / Self Care | Payer: Medicare Other | Source: Ambulatory Visit | Attending: Osteopathic Medicine | Admitting: Osteopathic Medicine

## 2020-12-03 DIAGNOSIS — R0789 Other chest pain: Secondary | ICD-10-CM | POA: Insufficient documentation

## 2020-12-03 DIAGNOSIS — R1012 Left upper quadrant pain: Secondary | ICD-10-CM | POA: Insufficient documentation

## 2020-12-03 DIAGNOSIS — Z9081 Acquired absence of spleen: Secondary | ICD-10-CM | POA: Insufficient documentation

## 2020-12-03 DIAGNOSIS — R109 Unspecified abdominal pain: Secondary | ICD-10-CM | POA: Diagnosis not present

## 2020-12-03 MED ORDER — IOHEXOL 300 MG/ML  SOLN
100.0000 mL | Freq: Once | INTRAMUSCULAR | Status: AC | PRN
  Administered 2020-12-03: 100 mL via INTRAVENOUS

## 2020-12-14 ENCOUNTER — Ambulatory Visit (INDEPENDENT_AMBULATORY_CARE_PROVIDER_SITE_OTHER): Payer: Medicare Other | Admitting: Cardiology

## 2020-12-14 ENCOUNTER — Encounter: Payer: Self-pay | Admitting: Cardiology

## 2020-12-14 ENCOUNTER — Other Ambulatory Visit: Payer: Self-pay

## 2020-12-14 VITALS — BP 140/70 | HR 61 | Ht <= 58 in | Wt 109.0 lb

## 2020-12-14 DIAGNOSIS — I358 Other nonrheumatic aortic valve disorders: Secondary | ICD-10-CM | POA: Diagnosis not present

## 2020-12-14 DIAGNOSIS — R011 Cardiac murmur, unspecified: Secondary | ICD-10-CM | POA: Diagnosis not present

## 2020-12-14 DIAGNOSIS — E782 Mixed hyperlipidemia: Secondary | ICD-10-CM

## 2020-12-14 DIAGNOSIS — R072 Precordial pain: Secondary | ICD-10-CM | POA: Diagnosis not present

## 2020-12-14 DIAGNOSIS — I10 Essential (primary) hypertension: Secondary | ICD-10-CM | POA: Diagnosis not present

## 2020-12-14 MED ORDER — NITROGLYCERIN 0.4 MG SL SUBL: 0.4000 mg | SUBLINGUAL_TABLET | SUBLINGUAL | 3 refills | Status: DC | PRN

## 2020-12-14 NOTE — Addendum Note (Signed)
Addended by: Orvan July on: 12/14/2020 02:05 PM   Modules accepted: Orders

## 2020-12-14 NOTE — Patient Instructions (Signed)
Medication Instructions:  Your physician has recommended you make the following change in your medication:  Nitroglycerin 0.4 mg take one tablet by mouth every 5 minutes up to three times as needed for chest pain.  *If you need a refill on your cardiac medications before your next appointment, please call your pharmacy*   Lab Work: None If you have labs (blood work) drawn today and your tests are completely normal, you will receive your results only by: Marland Kitchen MyChart Message (if you have MyChart) OR . A paper copy in the mail If you have any lab test that is abnormal or we need to change your treatment, we will call you to review the results.   Testing/Procedures: Your physician has requested that you have an echocardiogram. Echocardiography is a painless test that uses sound waves to create images of your heart. It provides your doctor with information about the size and shape of your heart and how well your heart's chambers and valves are working. This procedure takes approximately one hour. There are no restrictions for this procedure.   Follow-Up: At [facility], you and your health needs are our priority.  As part of our continuing mission to provide you with exceptional heart care, we have created designated Provider Care Teams.  These Care Teams include your primary Cardiologist (physician) and Advanced Practice Providers (APPs -  Physician Assistants and Nurse Practitioners) who all work together to provide you with the care you need, when you need it.  We recommend signing up for the patient portal called "MyChart".  Sign up information is provided on this After Visit Summary.  MyChart is used to connect with patients for Virtual Visits (Telemedicine).  Patients are able to view lab/test results, encounter notes, upcoming appointments, etc.  Non-urgent messages can be sent to your provider as well.   To learn more about what you can do with MyChart, go to [email or web address].     Your next appointment:   8 week(s)  The format for your next appointment:   In Person  Provider:   Berniece Salines, DO   Other Instructions  Echocardiogram An echocardiogram is a test that uses sound waves (ultrasound) to produce images of the heart. Images from an echocardiogram can provide important information about:  Heart size and shape.  The size and thickness and movement of your heart's walls.  Heart muscle function and strength.  Heart valve function or if you have stenosis. Stenosis is when the heart valves are too narrow.  If blood is flowing backward through the heart valves (regurgitation).  A tumor or infectious growth around the heart valves.  Areas of heart muscle that are not working well because of poor blood flow or injury from a heart attack.  Aneurysm detection. An aneurysm is a weak or damaged part of an artery wall. The wall bulges out from the normal force of blood pumping through the body. Tell a health care provider about:  Any allergies you have.  All medicines you are taking, including vitamins, herbs, eye drops, creams, and over-the-counter medicines.  Any blood disorders you have.  Any surgeries you have had.  Any medical conditions you have.  Whether you are pregnant or may be pregnant. What are the risks? Generally, this is a safe test. However, problems may occur, including an allergic reaction to dye (contrast) that may be used during the test. What happens before the test? No specific preparation is needed. You may eat and drink normally. What happens during the  test?  You will take off your clothes from the waist up and put on a hospital gown.  Electrodes or electrocardiogram (ECG)patches may be placed on your chest. The electrodes or patches are then connected to a device that monitors your heart rate and rhythm.  You will lie down on a table for an ultrasound exam. A gel will be applied to your chest to help sound waves pass  through your skin.  A handheld device, called a transducer, will be pressed against your chest and moved over your heart. The transducer produces sound waves that travel to your heart and bounce back (or "echo" back) to the transducer. These sound waves will be captured in real-time and changed into images of your heart that can be viewed on a video monitor. The images will be recorded on a computer and reviewed by your health care provider.  You may be asked to change positions or hold your breath for a short time. This makes it easier to get different views or better views of your heart.  In some cases, you may receive contrast through an IV in one of your veins. This can improve the quality of the pictures from your heart. The procedure may vary among health care providers and hospitals.   What can I expect after the test? You may return to your normal, everyday life, including diet, activities, and medicines, unless your health care provider tells you not to do that. Follow these instructions at home:  It is up to you to get the results of your test. Ask your health care provider, or the department that is doing the test, when your results will be ready.  Keep all follow-up visits. This is important. Summary  An echocardiogram is a test that uses sound waves (ultrasound) to produce images of the heart.  Images from an echocardiogram can provide important information about the size and shape of your heart, heart muscle function, heart valve function, and other possible heart problems.  You do not need to do anything to prepare before this test. You may eat and drink normally.  After the echocardiogram is completed, you may return to your normal, everyday life, unless your health care provider tells you not to do that. This information is not intended to replace advice given to you by your health care provider. Make sure you discuss any questions you have with your health care  provider. Document Revised: 04/07/2020 Document Reviewed: 04/07/2020 Elsevier Patient Education  2021 [employer].

## 2020-12-14 NOTE — Progress Notes (Signed)
Cardiology Office Note:    Date:  12/14/2020   ID:  Mercedes Decker, DOB [birth year], MRN [id number]  PCP:  Emeterio Reeve, DO  Cardiologist:  Berniece Salines, DO  Electrophysiologist:  None   Referring MD: Emeterio Reeve, DO   Chief Complaint  Patient presents with  . Chest Pain    History of Present Illness:    Mercedes Decker is a 80 y.o. female with a hx of hypertension, history of TIA, prediabetes, hyperlipidemia statin intolerant, here today to be evaluated for intermittent chest pain.  The patient tells me that she has had some left-sided squeezing and tightness sensation.  Notes that it is intermittent and happens once a week.  She denies any associated shortness of breath.  Nothing makes it better or worse.  She has not had any palpitations, no lightheadedness no dizziness.  Past Medical History:  Diagnosis Date  . Arthritis   . Hypertension   . Migraine   . Osteoporosis   . Prediabetes 08/28/2018    Past Surgical History:  Procedure Laterality Date  . ABDOMINAL HYSTERECTOMY    . BACK SURGERY    . BREAST SURGERY    . BUNIONECTOMY    . INCISIONAL HERNIA REPAIR    . KNEE ARTHROSCOPY      Current Medications: No outpatient medications have been marked as taking for the 12/14/20 encounter (Office Visit) with Berniece Salines, DO.     Allergies:   Tape, Cortisone, Meloxicam, Ace inhibitors, Atorvastatin, Gabapentin, Losartan, and Vicodin [hydrocodone-acetaminophen]   Social History   Socioeconomic History  . Marital status: Married    Spouse name: Purcell Nails  . Number of children: 1  . Years of education: 52  . Highest education level: 12th grade  Occupational History  . Occupation: Firefighter    Comment: retired  Tobacco Use  . Smoking status: Never Smoker  . Smokeless tobacco: Never Used  Vaping Use  . Vaping Use: Never used  Substance and Sexual Activity  . Alcohol use: No  . Drug use: No  . Sexual activity: Yes    Birth control/protection:  Post-menopausal  Other Topics Concern  . Not on file  Social History Narrative   Retired. Works around [employer]. Walks daily. No caffeine   Social Determinants of Radio broadcast assistant Strain: Not on file  Food Insecurity: Not on file  Transportation Needs: Not on file  Physical Activity: Not on file  Stress: Not on file  Social Connections: Not on file     Family History: The patient's family history includes Alzheimer's disease in her mother; Cancer in her brother and sister; Diabetes in her brother and sister; Heart disease in her brother and father.  ROS:   Review of Systems  Constitution: Negative for decreased appetite, fever and weight gain.  HENT: Negative for congestion, ear discharge, hoarse voice and sore throat.   Eyes: Negative for discharge, redness, vision loss in right eye and visual halos.  Cardiovascular: Negative for chest pain, dyspnea on exertion, leg swelling, orthopnea and palpitations.  Respiratory: Negative for cough, hemoptysis, shortness of breath and snoring.   Endocrine: Negative for heat intolerance and polyphagia.  Hematologic/Lymphatic: Negative for bleeding problem. Does not bruise/bleed easily.  Skin: Negative for flushing, nail changes, rash and suspicious lesions.  Musculoskeletal: Negative for arthritis, joint pain, muscle cramps, myalgias, neck pain and stiffness.  Gastrointestinal: Negative for abdominal pain, bowel incontinence, diarrhea and excessive appetite.  Genitourinary: Negative for decreased libido, genital sores and incomplete emptying.  Neurological: Negative for brief paralysis, focal weakness, headaches and loss of balance.  Psychiatric/Behavioral: Negative for altered mental status, depression and suicidal ideas.  Allergic/Immunologic: Negative for HIV exposure and persistent infections.    EKGs/Labs/Other Studies Reviewed:    The following studies were reviewed today:   EKG:  The ekg ordered today demonstrates  sinus rhythm, heart rate 61 bpm.   Transthoracic echocardiogram December 02, 2015 Study Conclusions   - Left ventricle: The cavity size was normal. Systolic function was  normal. The estimated ejection fraction was in the range of 60%  to 65%. Wall motion was normal; there were no regional wall  motion abnormalities. There was an increased relative  contribution of atrial contraction to ventricular filling.  Doppler parameters are consistent with abnormal left ventricular  relaxation (grade 1 diastolic dysfunction).  - Aortic valve: Moderately calcified annulus. Trileaflet. Moderate  diffuse thickening and calcification, consistent with sclerosis.  There was trivial regurgitation.  - Mitral valve: Calcified annulus. There was mild regurgitation.  - Atrial septum: There was increased thickness of the septum,  consistent with lipomatous hypertrophy.  - Tricuspid valve: There was mild regurgitation.  - Pulmonic valve: There was trivial regurgitation.    Recent Labs: 12/01/2020: ALT 18; BUN 15; Creat 0.92; Hemoglobin 11.8; Platelets 331; Potassium 5.2; Sodium 141  Recent Lipid Panel    Component Value Date/Time   CHOL 243 (H) 01/25/2019 0836   TRIG 126 01/25/2019 0836   HDL 68 01/25/2019 0836   CHOLHDL 3.6 01/25/2019 0836   VLDL 26 01/24/2017 0908   LDLCALC 150 (H) 01/25/2019 0836    Physical Exam:    VS:  BP 140/70 (BP Location: Left Arm, Patient Position: Sitting, Cuff Size: Normal)   Pulse 61   Ht 4\' 10"  (1.473 m)   Wt 109 lb (49.4 kg)   SpO2 97%   BMI 22.78 kg/m     Wt Readings from Last 3 Encounters:  12/14/20 109 lb (49.4 kg)  12/01/20 109 lb (49.4 kg)  03/30/20 108 lb (49 kg)     GEN: Well nourished, well developed in no acute distress HEENT: Normal NECK: No JVD; No carotid bruits LYMPHATICS: No lymphadenopathy CARDIAC: S1S2 noted,RRR, ejection systolic murmurs, rubs, gallops RESPIRATORY:  Clear to auscultation without rales, wheezing or rhonchi   ABDOMEN: Soft, non-tender, non-distended, +bowel sounds, no guarding. EXTREMITIES: No edema, No cyanosis, no clubbing MUSCULOSKELETAL:  No deformity  SKIN: Warm and dry NEUROLOGIC:  Alert and oriented x 3, non-focal PSYCHIATRIC:  Normal affect, good insight  ASSESSMENT:    1. Aortic valve sclerosis   2. Precordial pain   3. Hypertension, unspecified type   4. Mixed hyperlipidemia    PLAN:     I reviewed the patient record her echocardiogram in 2017 showing EF normal with aortic valve atherosclerosis without stenosis.  She does have an ejection murmur therefore I like to repeat an echocardiogram worsening aortic valve disease.  In meantime in terms of her chest pain we will continue to monitor closely and pursue ischemic evaluation once we have followed up on her aortic valve disease.  Sublingual nitroglycerin prescription was sent, its protocol and 911 protocol explained and the patient vocalized understanding questions were answered to the patient's satisfaction.  She does have hyperlipidemia the patient had intolerance to statin and would prefer not to take any further statins at this time.  We will continue to monitor with blood work.  The patient is in agreement with the above plan. The patient left the office  in stable condition.  The patient will follow up in 8 weeks or sooner if needed.   Medication Adjustments/Labs and Tests Ordered: Current medicines are reviewed at length with the patient today.  Concerns regarding medicines are outlined above.  No orders of the defined types were placed in this encounter.  No orders of the defined types were placed in this encounter.   There are no Patient Instructions on file for this visit.   Adopting a Healthy Lifestyle.  Know what a healthy weight is for you (roughly BMI <25) and aim to maintain this   Aim for 7+ servings of fruits and vegetables daily   65-80+ fluid ounces of water or unsweet tea for healthy kidneys    Limit to max 1 drink of alcohol per day; avoid smoking/tobacco   Limit animal fats in diet for cholesterol and heart health - choose grass fed whenever available   Avoid highly processed foods, and foods high in saturated/trans fats   Aim for low stress - take time to unwind and care for your mental health   Aim for 150 min of moderate intensity exercise weekly for heart health, and weights twice weekly for bone health   Aim for 7-9 hours of sleep daily   When it comes to diets, agreement about the perfect plan isnt easy to find, even among the experts. Experts at the [place] developed an idea known as the Healthy Eating Plate. Just imagine a plate divided into logical, healthy portions.   The emphasis is on diet quality:   Load up on vegetables and fruits - one-half of your plate: Aim for color and variety, and remember that potatoes dont count.   Go for whole grains - one-quarter of your plate: Whole wheat, barley, wheat berries, quinoa, oats, brown rice, and foods made with them. If you want pasta, go with whole wheat pasta.   Protein power - one-quarter of your plate: Fish, chicken, beans, and nuts are all healthy, versatile protein sources. Limit red meat.   The diet, however, does go beyond the plate, offering a few other suggestions.   Use healthy plant oils, such as olive, canola, soy, corn, sunflower and peanut. Check the labels, and avoid partially hydrogenated oil, which have unhealthy trans fats.   If youre thirsty, drink water. Coffee and tea are good in moderation, but skip sugary drinks and limit milk and dairy products to one or two daily servings.   The type of carbohydrate in the diet is more important than the amount. Some sources of carbohydrates, such as vegetables, fruits, whole grains, and beans-are healthier than others.   Finally, stay active  Signed, Berniece Salines, DO  12/14/2020 10:24 AM    [place]

## 2020-12-15 ENCOUNTER — Ambulatory Visit: Payer: Medicare Other | Admitting: Cardiology

## 2020-12-31 DIAGNOSIS — L209 Atopic dermatitis, unspecified: Secondary | ICD-10-CM | POA: Diagnosis not present

## 2021-01-04 DIAGNOSIS — Z23 Encounter for immunization: Secondary | ICD-10-CM | POA: Diagnosis not present

## 2021-01-13 ENCOUNTER — Other Ambulatory Visit: Payer: Self-pay

## 2021-01-13 ENCOUNTER — Ambulatory Visit (HOSPITAL_COMMUNITY)
Admission: RE | Admit: 2021-01-13 | Discharge: 2021-01-13 | Disposition: A | Discharge status: Home / Self Care | Payer: Medicare Other | Source: Ambulatory Visit | Attending: Cardiology | Admitting: Cardiology

## 2021-01-13 DIAGNOSIS — I351 Nonrheumatic aortic (valve) insufficiency: Secondary | ICD-10-CM | POA: Insufficient documentation

## 2021-01-13 DIAGNOSIS — R011 Cardiac murmur, unspecified: Secondary | ICD-10-CM | POA: Insufficient documentation

## 2021-01-13 DIAGNOSIS — I119 Hypertensive heart disease without heart failure: Secondary | ICD-10-CM | POA: Insufficient documentation

## 2021-01-13 LAB — ECHOCARDIOGRAM COMPLETE
Area-P 1/2: 3.77 cm2
P 1/2 time: 645 msec
S' Lateral: 2.3 cm

## 2021-01-13 NOTE — Progress Notes (Signed)
  Echocardiogram 2D Echocardiogram has been performed.  Mercedes Decker 01/13/2021, 8:59 AM

## 2021-01-28 ENCOUNTER — Other Ambulatory Visit: Payer: Self-pay | Admitting: Osteopathic Medicine

## 2021-01-28 DIAGNOSIS — L219 Seborrheic dermatitis, unspecified: Secondary | ICD-10-CM | POA: Diagnosis not present

## 2021-01-28 DIAGNOSIS — L209 Atopic dermatitis, unspecified: Secondary | ICD-10-CM | POA: Diagnosis not present

## 2021-01-28 DIAGNOSIS — I10 Essential (primary) hypertension: Secondary | ICD-10-CM

## 2021-01-28 NOTE — Telephone Encounter (Signed)
[facility] pharmacy is requesting med refill for metoprolol succ. Rx not listed in active med list.

## 2021-01-29 ENCOUNTER — Other Ambulatory Visit: Payer: Self-pay

## 2021-01-29 DIAGNOSIS — I10 Essential (primary) hypertension: Secondary | ICD-10-CM

## 2021-01-29 MED ORDER — AMLODIPINE BESYLATE 5 MG PO TABS: ORAL_TABLET | ORAL | 1 refills | Status: DC

## 2021-01-29 NOTE — Telephone Encounter (Signed)
Looks like another office marked this as discontinued Please confirm w/ patient Ok to refill if she's still taking it

## 2021-01-29 NOTE — Telephone Encounter (Signed)
Task completed. Per pt, no longer taking metoprolol succ. Refill for amlodipine sent to [place].

## 2021-02-09 ENCOUNTER — Other Ambulatory Visit: Payer: Self-pay

## 2021-02-09 DIAGNOSIS — IMO0002 Reserved for concepts with insufficient information to code with codable children: Secondary | ICD-10-CM | POA: Insufficient documentation

## 2021-02-09 DIAGNOSIS — G2581 Restless legs syndrome: Secondary | ICD-10-CM | POA: Insufficient documentation

## 2021-02-09 DIAGNOSIS — N3289 Other specified disorders of bladder: Secondary | ICD-10-CM | POA: Insufficient documentation

## 2021-02-09 DIAGNOSIS — K649 Unspecified hemorrhoids: Secondary | ICD-10-CM | POA: Insufficient documentation

## 2021-02-11 ENCOUNTER — Other Ambulatory Visit: Payer: Self-pay

## 2021-02-11 ENCOUNTER — Ambulatory Visit (INDEPENDENT_AMBULATORY_CARE_PROVIDER_SITE_OTHER): Payer: Medicare Other | Admitting: Cardiology

## 2021-02-11 ENCOUNTER — Encounter: Payer: Self-pay | Admitting: Cardiology

## 2021-02-11 VITALS — BP 140/66 | HR 67 | Ht <= 58 in | Wt 106.0 lb

## 2021-02-11 DIAGNOSIS — I10 Essential (primary) hypertension: Secondary | ICD-10-CM

## 2021-02-11 DIAGNOSIS — R079 Chest pain, unspecified: Secondary | ICD-10-CM

## 2021-02-11 DIAGNOSIS — G459 Transient cerebral ischemic attack, unspecified: Secondary | ICD-10-CM | POA: Diagnosis not present

## 2021-02-11 DIAGNOSIS — E782 Mixed hyperlipidemia: Secondary | ICD-10-CM | POA: Diagnosis not present

## 2021-02-11 NOTE — Progress Notes (Signed)
Cardiology Office Note:    Date:  02/11/2021   ID:  Mercedes Decker, DOB 1941/05/20, MRN [id number]  PCP:  Emeterio Reeve, DO  Cardiologist:  Berniece Salines, DO  Electrophysiologist:  None   Referring MD: Emeterio Reeve, DO   No chief complaint on file. " I am still having some chest discomfort"  History of Present Illness:    Mercedes Decker is a 80 y.o. female with a hx of hypertension, history of TIA, prediabetes, hyperlipidemia statin intolerant, here today to be evaluated for intermittent chest pain. I initially saw the patient on 12/14/2020 at that time she was experiencing chest discomfort. Her physical exam did reveal a mid ejection systolic murmur.  We therefore did an echocardiogram first to assess the murmur prior to planning her ischemic evaluation.  She had her murmur we did not show any significant valvular abnormalities.  She still is experiencing intermittent left-sided chest discomfort.  She describes this as a squeezing pain.  Sometimes this tightness.  Happens sometimes once a week.  No radiation.  Last about few minutes and it resolved.  Past Medical History:  Diagnosis Date   Arthritis    Hypertension    Migraine    Osteoporosis    Prediabetes 08/28/2018    Past Surgical History:  Procedure Laterality Date   ABDOMINAL HYSTERECTOMY     BACK SURGERY     BREAST SURGERY     BUNIONECTOMY     INCISIONAL HERNIA REPAIR     KNEE ARTHROSCOPY      Current Medications: Current Meds  Medication Sig   acetaminophen (TYLENOL) 500 MG tablet Take 500 mg by mouth every 8 (eight) hours as needed (for pain).   amLODipine (NORVASC) 5 MG tablet TAKE 1 TABLET(5 MG) BY MOUTH DAILY. FOLLOW UP APPOINTMENT WITH PCP FOR FURTHER REFILLS   Ascorbic Acid (VITAMIN C) 125 MG CHEW Chew 125 mg by mouth daily.   aspirin EC 81 MG tablet Take 1 tablet (81 mg total) by mouth daily. (Patient taking differently: Take 81 mg by mouth daily. Takes 4 weekly)   Calcium Carbonate (CALCIUM  600 PO) Take 600 mg by mouth daily.   Cholecalciferol (VITAMIN D3) 25 MCG (1000 UT) CAPS Take 1,000 Units by mouth daily.   Cyanocobalamin (VITAMIN B 12 PO) Take 500 mg by mouth daily.   nitroGLYCERIN (NITROSTAT) 0.4 MG SL tablet Place 1 tablet (0.4 mg total) under the tongue every 5 (five) minutes as needed for chest pain.   OVER THE COUNTER MEDICATION Take 500 mg by mouth daily.   Turmeric Curcumin 500 MG CAPS Take 1,500 mg by mouth daily.   vitamin E 180 MG (400 UNITS) capsule Take 400 Units by mouth daily.     Allergies:   Tape, Cortisone, Meloxicam, Ace inhibitors, Atorvastatin, Gabapentin, Losartan, and Vicodin [hydrocodone-acetaminophen]   Social History   Socioeconomic History   Marital status: Married    Spouse name: Lawrence   Number of children: 1   Years of education: 12   Highest education level: 12th grade  Occupational History   Occupation: Firefighter    Comment: retired  Tobacco Use   Smoking status: Never   Smokeless tobacco: Never  Vaping Use   Vaping Use: Never used  Substance and Sexual Activity   Alcohol use: No   Drug use: No   Sexual activity: Yes    Birth control/protection: Post-menopausal  Other Topics Concern   Not on file  Social History Narrative   Retired. Works around  the house. Walks daily. No caffeine   Social Determinants of Radio broadcast assistant Strain: Not on file  Food Insecurity: Not on file  Transportation Needs: Not on file  Physical Activity: Not on file  Stress: Not on file  Social Connections: Not on file     Family History: The patient's family history includes Alzheimer's disease in her mother; Cancer in her brother and sister; Diabetes in her brother and sister; Heart disease in her brother and father.  ROS:   Review of Systems  Constitution: Negative for decreased appetite, fever and weight gain.  HENT: Negative for congestion, ear discharge, hoarse voice and sore throat.   Eyes: Negative for discharge,  redness, vision loss in right eye and visual halos.  Cardiovascular: Reports chest pain.  Negative for dyspnea on exertion, leg swelling, orthopnea and palpitations.  Respiratory: Negative for cough, hemoptysis, shortness of breath and snoring.   Endocrine: Negative for heat intolerance and polyphagia.  Hematologic/Lymphatic: Negative for bleeding problem. Does not bruise/bleed easily.  Skin: Negative for flushing, nail changes, rash and suspicious lesions.  Musculoskeletal: Negative for arthritis, joint pain, muscle cramps, myalgias, neck pain and stiffness.  Gastrointestinal: Negative for abdominal pain, bowel incontinence, diarrhea and excessive appetite.  Genitourinary: Negative for decreased libido, genital sores and incomplete emptying.  Neurological: Negative for brief paralysis, focal weakness, headaches and loss of balance.  Psychiatric/Behavioral: Negative for altered mental status, depression and suicidal ideas.  Allergic/Immunologic: Negative for HIV exposure and persistent infections.    EKGs/Labs/Other Studies Reviewed:    The following studies were reviewed today:   EKG: None today  TTE 2022 IMPRESSIONS   1. Left ventricular ejection fraction, by estimation, is 60 to 65%. The left ventricle has normal function. The left ventricle has no regional wall motion abnormalities. There is mild asymmetric left ventricular hypertrophy of the basal and septal  segments. Left ventricular diastolic parameters were normal.   2. Right ventricular systolic function is normal. The right ventricular size is normal. There is normal pulmonary artery systolic pressure.   3. The mitral valve is abnormal. Mild mitral valve regurgitation. No evidence of mitral stenosis.   4. The aortic valve is tricuspid. There is moderate calcification of the aortic valve. There is moderate thickening of the aortic valve. Aortic valve regurgitation is mild. Sclerosis without stenosis.   5. The inferior vena cava  is normal in size with greater than 50% respiratory variability, suggesting right atrial pressure of 3 mmHg.  FINDINGS   Left Ventricle: Left ventricular ejection fraction, by estimation, is 60 to 65%. The left ventricle has normal function. The left ventricle has no regional wall motion abnormalities. The left ventricular internal cavity size was normal in size. There is  mild asymmetric left ventricular hypertrophy of the basal and septal segments. Left ventricular diastolic parameters were normal.   Right Ventricle: The right ventricular size is normal. No increase in right ventricular wall thickness. Right ventricular systolic function is normal. There is normal pulmonary artery systolic pressure. The tricuspid regurgitant velocity is 2.35 m/s, and   with an assumed right atrial pressure of 3 mmHg, the estimated right ventricular systolic pressure is 22.4 mmHg.   Left Atrium: Left atrial size was normal in size.   Right Atrium: Right atrial size was normal in size.   Pericardium: There is no evidence of pericardial effusion.   Mitral Valve: The mitral valve is abnormal. There is mild calcification of the mitral valve leaflet(s). Mild mitral valve regurgitation. No evidence of  mitral valve stenosis.   Tricuspid Valve: The tricuspid valve is normal in structure. Tricuspid valve regurgitation is mild . No evidence of tricuspid stenosis.   Aortic Valve: The aortic valve is tricuspid. There is moderate calcification of the aortic valve. There is moderate thickening of the  aortic valve. Aortic valve regurgitation is mild. Aortic regurgitation PHT measures 645 msec. Sclerosis without  stenosis.   Pulmonic Valve: The pulmonic valve was normal in structure. Pulmonic valve regurgitation is not visualized. No evidence of pulmonic stenosis.   Aorta: The aortic root is normal in size and structure.   Venous: The inferior vena cava is normal in size with greater than 50% respiratory variability,  suggesting right atrial pressure of 3 mmHg.   IAS/Shunts: No atrial level shunt detected by color flow   Recent Labs: 12/01/2020: ALT 18; BUN 15; Creat 0.92; Hemoglobin 11.8; Platelets 331; Potassium 5.2; Sodium 141  Recent Lipid Panel    Component Value Date/Time   CHOL 243 (H) 01/25/2019 0836   TRIG 126 01/25/2019 0836   HDL 68 01/25/2019 0836   CHOLHDL 3.6 01/25/2019 0836   VLDL 26 01/24/2017 0908   LDLCALC 150 (H) 01/25/2019 0836    Physical Exam:    VS:  BP 140/66   Pulse 67   Ht 4\' 10"  (1.473 m)   Wt 106 lb (48.1 kg)   SpO2 97%   BMI 22.15 kg/m     Wt Readings from Last 3 Encounters:  02/11/21 106 lb (48.1 kg)  12/14/20 109 lb (49.4 kg)  12/01/20 109 lb (49.4 kg)     GEN: Well nourished, well developed in no acute distress HEENT: Normal NECK: No JVD; No carotid bruits LYMPHATICS: No lymphadenopathy CARDIAC: S1S2 noted,RRR, no murmurs, rubs, gallops RESPIRATORY:  Clear to auscultation without rales, wheezing or rhonchi  ABDOMEN: Soft, non-tender, non-distended, +bowel sounds, no guarding. EXTREMITIES: No edema, No cyanosis, no clubbing MUSCULOSKELETAL:  No deformity  SKIN: Warm and dry NEUROLOGIC:  Alert and oriented x 3, non-focal PSYCHIATRIC:  Normal affect, good insight  ASSESSMENT:    1. Chest pain, unspecified type   2. TIA (transient ischemic attack)   3. Hypertension, unspecified type   4. Mixed hyperlipidemia    PLAN:     1.  I reviewed her echocardiogram with her today which did show evidence of sclerosis without stenosis.  She still experiences chest discomfort with care from proceed with an ischemic evaluation in this patient.  We talked about nuclear stress test.  Educated patient about this test and she is agreeable to proceed.  I again reminded patient about the nitroglycerin protocol she expresses understanding.  2.  In terms of her hyperlipidemia and she has statin intolerance and prefer not to try any further statin and is not trusted at  this time to discuss any further lipid-lowering agents.  We will revisit this at another visit again.  3.  Continue current antihypertensive medication. Going up to [place] to visit her family and she is looking forward to this. The patient is in agreement with the above plan. The patient left the office in stable condition.  The patient will follow up in 6 months or sooner if needed.   Medication Adjustments/Labs and Tests Ordered: Current medicines are reviewed at length with the patient today.  Concerns regarding medicines are outlined above.  Orders Placed This Encounter  Procedures   MYOCARDIAL PERFUSION IMAGING   No orders of the defined types were placed in this encounter.  Patient Instructions  Medication Instructions:  Your physician recommends that you continue on your current medications as directed. Please refer to the Current Medication list given to you today.  *If you need a refill on your cardiac medications before your next appointment, please call your pharmacy*   Lab Work: None If you have labs (blood work) drawn today and your tests are completely normal, you will receive your results only by: [place] (if you have MyChart) OR A paper copy in the mail If you have any lab test that is abnormal or we need to change your treatment, we will call you to review the results.   Testing/Procedures:   [facility] Cardiovascular Imaging at [facility] [address], [place], [place] [address] Phone: [phone]    Please arrive 15 minutes prior to your appointment time for registration and insurance purposes.  The test will take approximately 3 to 4 hours to complete; you may bring reading material.  If someone comes with you to your appointment, they will need to remain in the main lobby due to limited space in the testing area. **If you are pregnant or breastfeeding, please notify the nuclear lab prior to your appointment**  How  to prepare for your Myocardial Perfusion Test: Do not eat or drink 3 hours prior to your test, except you may have water. Do not consume products containing caffeine (regular or decaffeinated) 12 hours prior to your test. (ex: coffee, chocolate, sodas, tea). Do bring a list of your current medications with you.  If not listed below, you may take your medications as normal. Do wear comfortable clothes (no dresses or overalls) and walking shoes, tennis shoes preferred (No heels or open toe shoes are allowed). Do NOT wear cologne, perfume, aftershave, or lotions (deodorant is allowed). If these instructions are not followed, your test will have to be rescheduled.  Please report to [address] for your test.  If you have questions or concerns about your appointment, you can call the Nuclear Lab at [phone].  If you cannot keep your appointment, please provide 24 hours notification to the Nuclear Lab, to avoid a possible $50 charge to your account.    Follow-Up: At [facility], you and your health needs are our priority.  As part of our continuing mission to provide you with exceptional heart care, we have created designated Provider Care Teams.  These Care Teams include your primary Cardiologist (physician) and Advanced Practice Providers (APPs -  Physician Assistants and Nurse Practitioners) who all work together to provide you with the care you need, when you need it.  We recommend signing up for the patient portal called "MyChart".  Sign up information is provided on this After Visit Summary.  MyChart is used to connect with patients for Virtual Visits (Telemedicine).  Patients are able to view lab/test results, encounter notes, upcoming appointments, etc.  Non-urgent messages can be sent to your provider as well.   To learn more about what you can do with MyChart, go to [email or web address].    Your next appointment:   6 month(s)  The format for your next  appointment:   In Person  Provider:   Berniece Salines, DO   Other Instructions    Adopting a Healthy Lifestyle.  Know what a healthy weight is for you (roughly BMI <25) and aim to maintain this   Aim for 7+ servings of fruits and vegetables daily   65-80+ fluid ounces of water or  unsweet tea for healthy kidneys   Limit to max 1 drink of alcohol per day; avoid smoking/tobacco   Limit animal fats in diet for cholesterol and heart health - choose grass fed whenever available   Avoid highly processed foods, and foods high in saturated/trans fats   Aim for low stress - take time to unwind and care for your mental health   Aim for 150 min of moderate intensity exercise weekly for heart health, and weights twice weekly for bone health   Aim for 7-9 hours of sleep daily   When it comes to diets, agreement about the perfect plan isnt easy to find, even among the experts. Experts at the [employer] developed an idea known as the Healthy Eating Plate. Just imagine a plate divided into logical, healthy portions.   The emphasis is on diet quality:   Load up on vegetables and fruits - one-half of your plate: Aim for color and variety, and remember that potatoes dont count.   Go for whole grains - one-quarter of your plate: Whole wheat, barley, wheat berries, quinoa, oats, brown rice, and foods made with them. If you want pasta, go with whole wheat pasta.   Protein power - one-quarter of your plate: Fish, chicken, beans, and nuts are all healthy, versatile protein sources. Limit red meat.   The diet, however, does go beyond the plate, offering a few other suggestions.   Use healthy plant oils, such as olive, canola, soy, corn, sunflower and peanut. Check the labels, and avoid partially hydrogenated oil, which have unhealthy trans fats.   If youre thirsty, drink water. Coffee and tea are good in moderation, but skip sugary drinks and limit milk and dairy products to  one or two daily servings.   The type of carbohydrate in the diet is more important than the amount. Some sources of carbohydrates, such as vegetables, fruits, whole grains, and beans-are healthier than others.   Finally, stay active  Signed, Berniece Salines, DO  02/11/2021 11:02 AM    [place]

## 2021-02-11 NOTE — Patient Instructions (Signed)
Medication Instructions:  Your physician recommends that you continue on your current medications as directed. Please refer to the Current Medication list given to you today.  *If you need a refill on your cardiac medications before your next appointment, please call your pharmacy*   Lab Work: None If you have labs (blood work) drawn today and your tests are completely normal, you will receive your results only by: [place] (if you have MyChart) OR A paper copy in the mail If you have any lab test that is abnormal or we need to change your treatment, we will call you to review the results.   Testing/Procedures:   [facility] Cardiovascular Imaging at [facility] [address], [place], [place] [address] Phone: [phone]    Please arrive 15 minutes prior to your appointment time for registration and insurance purposes.  The test will take approximately 3 to 4 hours to complete; you may bring reading material.  If someone comes with you to your appointment, they will need to remain in the main lobby due to limited space in the testing area. **If you are pregnant or breastfeeding, please notify the nuclear lab prior to your appointment**  How to prepare for your Myocardial Perfusion Test: Do not eat or drink 3 hours prior to your test, except you may have water. Do not consume products containing caffeine (regular or decaffeinated) 12 hours prior to your test. (ex: coffee, chocolate, sodas, tea). Do bring a list of your current medications with you.  If not listed below, you may take your medications as normal. Do wear comfortable clothes (no dresses or overalls) and walking shoes, tennis shoes preferred (No heels or open toe shoes are allowed). Do NOT wear cologne, perfume, aftershave, or lotions (deodorant is allowed). If these instructions are not followed, your test will have to be rescheduled.  Please report to [address] for your  test.  If you have questions or concerns about your appointment, you can call the Nuclear Lab at [phone].  If you cannot keep your appointment, please provide 24 hours notification to the Nuclear Lab, to avoid a possible $50 charge to your account.    Follow-Up: At [facility], you and your health needs are our priority.  As part of our continuing mission to provide you with exceptional heart care, we have created designated Provider Care Teams.  These Care Teams include your primary Cardiologist (physician) and Advanced Practice Providers (APPs -  Physician Assistants and Nurse Practitioners) who all work together to provide you with the care you need, when you need it.  We recommend signing up for the patient portal called "MyChart".  Sign up information is provided on this After Visit Summary.  MyChart is used to connect with patients for Virtual Visits (Telemedicine).  Patients are able to view lab/test results, encounter notes, upcoming appointments, etc.  Non-urgent messages can be sent to your provider as well.   To learn more about what you can do with MyChart, go to [email or web address].    Your next appointment:   6 month(s)  The format for your next appointment:   In Person  Provider:   Berniece Salines, DO   Other Instructions

## 2021-02-12 ENCOUNTER — Other Ambulatory Visit: Payer: Self-pay | Admitting: Osteopathic Medicine

## 2021-02-12 ENCOUNTER — Telehealth: Payer: Self-pay

## 2021-02-12 DIAGNOSIS — I10 Essential (primary) hypertension: Secondary | ICD-10-CM

## 2021-02-12 NOTE — Telephone Encounter (Signed)
[facility] pharmacy requesting med refills for metoprololol er succ 50mg . Rx not listed in active med list.

## 2021-02-15 NOTE — Telephone Encounter (Signed)
Do not refill, defer this Rx to cardiology

## 2021-02-16 NOTE — Telephone Encounter (Signed)
Task completed. Pharmacy updated regarding provider's note via fax. Confirmation rec'd.

## 2021-02-18 ENCOUNTER — Encounter (HOSPITAL_COMMUNITY): Payer: Self-pay | Admitting: *Deleted

## 2021-02-18 ENCOUNTER — Telehealth (HOSPITAL_COMMUNITY): Payer: Self-pay | Admitting: *Deleted

## 2021-02-18 NOTE — Addendum Note (Signed)
Addended by: Berniece Salines on: 02/18/2021 10:29 AM   Modules accepted: Orders

## 2021-02-18 NOTE — Telephone Encounter (Signed)
Left message on voicemail in reference to upcoming appointment scheduled for 02/25/21. Phone number given for a call back so details instructions can be given. Letter sent via my chart.  [name]

## 2021-02-23 ENCOUNTER — Telehealth (HOSPITAL_COMMUNITY): Payer: Self-pay

## 2021-02-23 NOTE — Telephone Encounter (Signed)
Spoke with the patient, detailed instructions left with the patient. She stated that she would be here for here test. S.Edelmira Gallogly EMTP

## 2021-02-25 ENCOUNTER — Ambulatory Visit (HOSPITAL_COMMUNITY): Payer: Medicare Other | Attending: Cardiology

## 2021-02-25 ENCOUNTER — Other Ambulatory Visit: Payer: Self-pay

## 2021-02-25 DIAGNOSIS — R079 Chest pain, unspecified: Secondary | ICD-10-CM | POA: Diagnosis not present

## 2021-02-25 LAB — MYOCARDIAL PERFUSION IMAGING
LV dias vol: 39 mL (ref 46–106)
LV sys vol: 8 mL
Peak HR: 110 {beats}/min
Rest HR: 80 {beats}/min
SDS: 1
SRS: 1
SSS: 2
TID: 0.99

## 2021-02-25 MED ORDER — TECHNETIUM TC 99M TETROFOSMIN IV KIT
10.5000 | PACK | Freq: Once | INTRAVENOUS | Status: AC | PRN
Start: 2021-02-25 — End: 2021-02-25
  Administered 2021-02-25: 10.5 via INTRAVENOUS
  Filled 2021-02-25: qty 11

## 2021-02-25 MED ORDER — REGADENOSON 0.4 MG/5ML IV SOLN
0.4000 mg | Freq: Once | INTRAVENOUS | Status: AC
  Administered 2021-02-25: 0.4 mg via INTRAVENOUS

## 2021-02-25 MED ORDER — TECHNETIUM TC 99M TETROFOSMIN IV KIT
29.7000 | PACK | Freq: Once | INTRAVENOUS | Status: AC | PRN
  Administered 2021-02-25: 29.7 via INTRAVENOUS
  Filled 2021-02-25: qty 30

## 2021-03-05 ENCOUNTER — Ambulatory Visit (INDEPENDENT_AMBULATORY_CARE_PROVIDER_SITE_OTHER): Payer: Medicare Other | Admitting: Osteopathic Medicine

## 2021-03-05 ENCOUNTER — Ambulatory Visit (INDEPENDENT_AMBULATORY_CARE_PROVIDER_SITE_OTHER): Payer: Medicare Other

## 2021-03-05 ENCOUNTER — Other Ambulatory Visit: Payer: Self-pay

## 2021-03-05 ENCOUNTER — Encounter: Payer: Self-pay | Admitting: Osteopathic Medicine

## 2021-03-05 VITALS — BP 144/67 | HR 78 | Wt 104.7 lb

## 2021-03-05 DIAGNOSIS — G2581 Restless legs syndrome: Secondary | ICD-10-CM | POA: Diagnosis not present

## 2021-03-05 DIAGNOSIS — R252 Cramp and spasm: Secondary | ICD-10-CM

## 2021-03-05 DIAGNOSIS — R0781 Pleurodynia: Secondary | ICD-10-CM

## 2021-03-05 DIAGNOSIS — R011 Cardiac murmur, unspecified: Secondary | ICD-10-CM | POA: Diagnosis not present

## 2021-03-05 DIAGNOSIS — I10 Essential (primary) hypertension: Secondary | ICD-10-CM

## 2021-03-05 DIAGNOSIS — M810 Age-related osteoporosis without current pathological fracture: Secondary | ICD-10-CM | POA: Diagnosis not present

## 2021-03-05 DIAGNOSIS — R7989 Other specified abnormal findings of blood chemistry: Secondary | ICD-10-CM | POA: Diagnosis not present

## 2021-03-05 DIAGNOSIS — R0789 Other chest pain: Secondary | ICD-10-CM | POA: Diagnosis not present

## 2021-03-05 MED ORDER — METOPROLOL SUCCINATE ER 50 MG PO TB24: 50.0000 mg | ORAL_TABLET | Freq: Every day | ORAL | 1 refills | Status: DC

## 2021-03-05 MED ORDER — AMLODIPINE BESYLATE 5 MG PO TABS: ORAL_TABLET | ORAL | 1 refills | Status: DC

## 2021-03-05 NOTE — Progress Notes (Signed)
Mercedes Decker is a 80 y.o. female who presents to  [place] [facility]  today, 03/05/21, seeking care for the following:  Bruising on R shin and cramping on R toes/calf, worse over the past 3 weeks. Was advised by Dr Lyla Son to f/u w/ PCP. Cramping at night ,most nights  HTN: BP up a bit at home, not taking the metoprolol. In cart this was D/C 12/14/20 which is same day she was seen by cardiology  Rib pain: reaching over to gab something in the grocery store and felt a rib "crack" and has pain now taking a deep breath      ASSESSMENT & PLAN with other pertinent findings:  The primary encounter diagnosis was Rib pain on right side. Diagnoses of Essential hypertension, Murmur, cardiac, RLS (restless legs syndrome), Muscle cramp, and Osteoporosis without current pathological fracture, unspecified osteoporosis type were also pertinent to this visit.    Patient Instructions  Metoprolol: I think ok to restart this - will route note to cardiology to make sure they're ok with this plan.  Cramping: will get labs today and if all normal will add Rx called Gabapentin to take in evenings to help with this.  Ribs:will get Xray today    Orders Placed This Encounter  Procedures   DG Ribs Unilateral Right   CBC   COMPLETE METABOLIC PANEL WITH GFR   Lipid panel   TSH   VITAMIN D 25 Hydroxy (Vit-D Deficiency, Fractures)   Magnesium   Fe+TIBC+Fer    Meds ordered this encounter  Medications   metoprolol succinate (TOPROL-XL) 50 MG 24 hr tablet    Sig: Take 1 tablet (50 mg total) by mouth daily. Take with or immediately following a meal.    Dispense:  90 tablet    Refill:  1   amLODipine (NORVASC) 5 MG tablet    Sig: TAKE 1 TABLET(5 MG) BY MOUTH DAILY.    Dispense:  90 tablet    Refill:  1     See below for relevant physical exam findings  See below for recent lab and imaging results reviewed  Medications, allergies, PMH, PSH, SocH, FamH  reviewed below    Follow-up instructions: Return in about 2 weeks (around 03/19/2021) for NURSE VISIT RECHECK BP .                                        Exam:  BP (!) 144/67   Pulse 78   Wt 104 lb 11.2 oz (47.5 kg)   SpO2 98%   BMI 21.88 kg/m  Constitutional: VS see above. General Appearance: alert, well-developed, well-nourished, NAD Neck: No masses, trachea midline.  Respiratory: Normal respiratory effort. no wheeze, no rhonchi, no rales Cardiovascular: S1/S2 normal, no murmur, no rub/gallop auscultated. RRR.  Musculoskeletal: Gait normal. Symmetric and independent movement of all extremities Neurological: Normal balance/coordination. No tremor. Skin: warm, dry, intact.  Psychiatric: Normal judgment/insight. Normal mood and affect. Oriented x3.   Current Meds  Medication Sig   acetaminophen (TYLENOL) 500 MG tablet Take 500 mg by mouth every 8 (eight) hours as needed (for pain).   Ascorbic Acid (VITAMIN C) 125 MG CHEW Chew 125 mg by mouth daily.   aspirin EC 81 MG tablet Take 1 tablet (81 mg total) by mouth daily. (Patient taking differently: Take 81 mg by mouth daily. Takes 4 weekly)  Calcium Carbonate (CALCIUM 600 PO) Take 600 mg by mouth daily.   Cholecalciferol (VITAMIN D3) 25 MCG (1000 UT) CAPS Take 1,000 Units by mouth daily.   Cyanocobalamin (VITAMIN B 12 PO) Take 500 mg by mouth daily.   nitroGLYCERIN (NITROSTAT) 0.4 MG SL tablet Place 1 tablet (0.4 mg total) under the tongue every 5 (five) minutes as needed for chest pain.   OVER THE COUNTER MEDICATION Take 500 mg by mouth daily.   Turmeric Curcumin 500 MG CAPS Take 1,500 mg by mouth daily.   vitamin E 180 MG (400 UNITS) capsule Take 400 Units by mouth daily.   [DISCONTINUED] amLODipine (NORVASC) 5 MG tablet TAKE 1 TABLET(5 MG) BY MOUTH DAILY. FOLLOW UP APPOINTMENT WITH PCP FOR FURTHER REFILLS    Allergies  Allergen Reactions   Tape Other (See Comments)    BANDAIDS AND ADHESIVE  TAPES TEAR SKIN (VERY THIN SKIN)  REQUESTING HYPOALLERGENIC TAPE.  "OUCHLESS TAPE." IS OK    Cortisone Other (See Comments)    Flushing and worsening joint pain    Meloxicam Other (See Comments)    Too sleepy    Ace Inhibitors Cough   Atorvastatin Other (See Comments)    Possible allergy   Gabapentin Other (See Comments)    Caused leg cramping   Losartan Other (See Comments)    Sweats, cramps   Vicodin [Hydrocodone-Acetaminophen] Other (See Comments)    Pt states that she stops breathing when she takes  Pain meds    Patient Active Problem List   Diagnosis Date Noted   DDD (degenerative disc disease) 02/09/2021   Hemorrhoids 02/09/2021   Irritable bladder 02/09/2021   RLS (restless legs syndrome) 02/09/2021   Contusion, back 05/25/2020   Cataract, nuclear sclerotic, left eye 04/12/2019   Neurogenic claudication due to lumbar spinal stenosis 03/04/2019   Prediabetes 08/28/2018   Fall 02/16/2018   Primary osteoarthritis of both knees 11/21/2017   H/O splenectomy 10/24/2017   Greater trochanteric bursitis of right hip 10/23/2017   Pancreatic tumor 05/08/2017   Dry eye syndrome of both lacrimal glands 04/24/2017   Pseudophakia of right eye 04/24/2017   Hoarseness 02/10/2017   Right shoulder injury 08/18/2016   Vitamin D deficiency 07/08/2016   Primary osteoarthritis of first carpometacarpal joint of right hand 05/01/2016   Right carpal tunnel syndrome 05/01/2016   TIA (transient ischemic attack) 03/08/2016   Right hand pain 03/07/2016   Patient has active power of attorney for health care 11/30/2015   Hypertension 12/25/2014   Murmur, cardiac 09/11/2012   Arthritis 09/03/2012   S/P hysterectomy 09/03/2012   Osteoporosis  L fem neck  Dexa 07/2014  -3.1 09/03/2012   Migraine 09/03/2012   History of anemia 09/03/2012   Mixed hyperlipidemia 09/03/2012   GERD (gastroesophageal reflux disease) 09/03/2012   Benign essential hypertension 08/17/2011    Family History   Problem Relation Age of Onset   Alzheimer's disease Mother    Heart disease Father    Cancer Sister    Diabetes Sister    Heart disease Brother    Diabetes Brother    Cancer Brother     Social History   Tobacco Use  Smoking Status Never  Smokeless Tobacco Never    Past Surgical History:  Procedure Laterality Date   ABDOMINAL HYSTERECTOMY     BACK SURGERY     BREAST SURGERY     BUNIONECTOMY     INCISIONAL HERNIA REPAIR     KNEE ARTHROSCOPY      Immunization History  Administered Date(s) Administered   Fluad Quad(high Dose 65+) 04/29/2019   HiB (PRP-OMP) 07/04/2017   Influenza Split 05/29/2010, 05/18/2011, 05/15/2012   Influenza, High Dose Seasonal PF 04/26/2016, 05/06/2017, 07/04/2017, 05/03/2018   Influenza,inj,Quad PF,6+ Mos 07/06/2020   Influenza-Unspecified 05/29/2010, 05/18/2011, 05/15/2012, 05/29/2013, 05/28/2014, 05/01/2015, 06/29/2016   Meningococcal Conjugate 11/07/2017, 01/02/2018   PFIZER Comirnaty(Gray Top)Covid-19 Tri-Sucrose Vaccine 01/04/2021   PFIZER(Purple Top)SARS-COV-2 Vaccination 09/19/2019, 10/10/2019, 06/02/2020   Pneumococcal Conjugate-13 04/26/2016, 07/04/2017   Pneumococcal Polysaccharide-23 03/30/2007, 11/07/2017   Pneumococcal-Unspecified 03/30/2007   Tdap 04/19/2008   Zoster, Live 08/29/2008    Recent Results (from the past 2160 hour(s))  ECHOCARDIOGRAM COMPLETE     Status: None   Collection Time: 01/13/21  8:59 AM  Result Value Ref Range   S' Lateral 2.30 cm   Area-P 1/2 3.77 cm2   P 1/2 time 645 msec  MYOCARDIAL PERFUSION IMAGING     Status: None   Collection Time: 02/25/21 10:04 AM  Result Value Ref Range   Rest HR 80 bpm   Rest BP 171/72 mmHg   Peak HR 110 bpm   Peak BP 155/65 mmHg   SSS 2    SRS 1    SDS 1    TID 0.99    LV sys vol 8 mL   LV dias vol 39 46 - 106 mL    No results found.     All questions at time of visit were answered - patient instructed to contact office with any additional concerns or updates.  ER/RTC precautions were reviewed with the patient as applicable.   Please note: manual typing as well as voice recognition software may have been used to produce this document - typos may escape review. Please contact Dr. Sheppard Coil for any needed clarifications.

## 2021-03-05 NOTE — Patient Instructions (Addendum)
Metoprolol: I think ok to restart this - will route note to cardiology to make sure they're ok with this plan.  Cramping: will get labs today and if all normal will add Rx called Gabapentin to take in evenings to help with this.  Ribs:will get Xray today

## 2021-03-08 LAB — COMPLETE METABOLIC PANEL WITH GFR
AG Ratio: 1.3 (calc) (ref 1.0–2.5)
ALT: 20 U/L (ref 6–29)
AST: 18 U/L (ref 10–35)
Albumin: 4.2 g/dL (ref 3.6–5.1)
Alkaline phosphatase (APISO): 80 U/L (ref 37–153)
BUN/Creatinine Ratio: 16 (calc) (ref 6–22)
BUN: 15 mg/dL (ref 7–25)
CO2: 27 mmol/L (ref 20–32)
Calcium: 9.7 mg/dL (ref 8.6–10.4)
Chloride: 101 mmol/L (ref 98–110)
Creat: 0.96 mg/dL — ABNORMAL HIGH (ref 0.60–0.88)
GFR, Est African American: 64 mL/min/{1.73_m2} (ref >=60)
GFR, Est Non African American: 55 mL/min/{1.73_m2} — ABNORMAL LOW (ref >=60)
Globulin: 3.3 g/dL (calc) (ref 1.9–3.7)
Glucose, Bld: 93 mg/dL (ref 65–99)
Potassium: 4.9 mmol/L (ref 3.5–5.3)
Sodium: 137 mmol/L (ref 135–146)
Total Bilirubin: 0.2 mg/dL (ref 0.2–1.2)
Total Protein: 7.5 g/dL (ref 6.1–8.1)

## 2021-03-08 LAB — TSH: TSH: 6.92 mIU/L — ABNORMAL HIGH (ref 0.40–4.50)

## 2021-03-08 LAB — T4, FREE: Free T4: 1.1 ng/dL (ref 0.8–1.8)

## 2021-03-08 LAB — CBC
HCT: 37.6 % (ref 35.0–45.0)
Hemoglobin: 12.8 g/dL (ref 11.7–15.5)
MCH: 31.4 pg (ref 27.0–33.0)
MCHC: 34 g/dL (ref 32.0–36.0)
MCV: 92.2 fL (ref 80.0–100.0)
MPV: 10.9 fL (ref 7.5–12.5)
Platelets: 365 10*3/uL (ref 140–400)
RBC: 4.08 10*6/uL (ref 3.80–5.10)
RDW: 13.9 % (ref 11.0–15.0)
WBC: 7.9 10*3/uL (ref 3.8–10.8)

## 2021-03-08 LAB — MAGNESIUM: Magnesium: 2.5 mg/dL (ref 1.5–2.5)

## 2021-03-08 LAB — TEST AUTHORIZATION

## 2021-03-08 LAB — LIPID PANEL
Cholesterol: 272 mg/dL — ABNORMAL HIGH (ref <200)
HDL: 80 mg/dL (ref >=50)
LDL Cholesterol (Calc): 162 mg/dL (calc) — ABNORMAL HIGH
Non-HDL Cholesterol (Calc): 192 mg/dL (calc) — ABNORMAL HIGH (ref <130)
Total CHOL/HDL Ratio: 3.4 (calc) (ref <5.0)
Triglycerides: 152 mg/dL — ABNORMAL HIGH (ref <150)

## 2021-03-08 LAB — IRON,TIBC AND FERRITIN PANEL
%SAT: 20 % (calc) (ref 16–45)
Ferritin: 11 ng/mL — ABNORMAL LOW (ref 16–288)
Iron: 92 ug/dL (ref 45–160)
TIBC: 450 mcg/dL (calc) (ref 250–450)

## 2021-03-08 LAB — VITAMIN D 25 HYDROXY (VIT D DEFICIENCY, FRACTURES): Vit D, 25-Hydroxy: 77 ng/mL (ref 30–100)

## 2021-03-19 ENCOUNTER — Ambulatory Visit (INDEPENDENT_AMBULATORY_CARE_PROVIDER_SITE_OTHER): Payer: Medicare Other | Admitting: Medical-Surgical

## 2021-03-19 ENCOUNTER — Ambulatory Visit: Payer: Medicare Other

## 2021-03-19 ENCOUNTER — Other Ambulatory Visit: Payer: Self-pay

## 2021-03-19 VITALS — BP 108/56 | HR 65 | Resp 20 | Ht <= 58 in | Wt 104.0 lb

## 2021-03-19 DIAGNOSIS — I10 Essential (primary) hypertension: Secondary | ICD-10-CM | POA: Diagnosis not present

## 2021-03-19 NOTE — Progress Notes (Signed)
Established Patient Office Visit  Subjective:  Patient ID: Mercedes Decker, female    DOB: [birth year]  Age: 80 y.o. MRN: [id number]  CC:  Chief Complaint  Patient presents with   Blood Pressure Check    HPI Mercedes Decker presents for a BP check. Pt taking medications as prescribed. First BP 114/53, after sitting for 10 minutes second BP is 108/56.  Past Medical History:  Diagnosis Date   Arthritis    Hypertension    Migraine    Osteoporosis    Prediabetes 08/28/2018    Past Surgical History:  Procedure Laterality Date   ABDOMINAL HYSTERECTOMY     BACK SURGERY     BREAST SURGERY     BUNIONECTOMY     INCISIONAL HERNIA REPAIR     KNEE ARTHROSCOPY      Family History  Problem Relation Age of Onset   Alzheimer's disease Mother    Heart disease Father    Cancer Sister    Diabetes Sister    Heart disease Brother    Diabetes Brother    Cancer Brother     Social History   Socioeconomic History   Marital status: Married    Spouse name: Purcell Nails   Number of children: 1   Years of education: 12   Highest education level: 12th grade  Occupational History   Occupation: Firefighter    Comment: retired  Tobacco Use   Smoking status: Never   Smokeless tobacco: Never  Scientific laboratory technician Use: Never used  Substance and Sexual Activity   Alcohol use: No   Drug use: No   Sexual activity: Yes    Birth control/protection: Post-menopausal  Other Topics Concern   Not on file  Social History Narrative   Retired. Works around [employer]. Walks daily. No caffeine   Social Determinants of Radio broadcast assistant Strain: Not on file  Food Insecurity: Not on file  Transportation Needs: Not on file  Physical Activity: Not on file  Stress: Not on file  Social Connections: Not on file  Intimate Partner Violence: Not on file    Outpatient Medications Prior to Visit  Medication Sig Dispense Refill   acetaminophen (TYLENOL) 500 MG tablet Take 500 mg by  mouth every 8 (eight) hours as needed (for pain).     amLODipine (NORVASC) 5 MG tablet TAKE 1 TABLET(5 MG) BY MOUTH DAILY. 90 tablet 1   Ascorbic Acid (VITAMIN C) 125 MG CHEW Chew 125 mg by mouth daily.     aspirin EC 81 MG tablet Take 1 tablet (81 mg total) by mouth daily. (Patient taking differently: Take 81 mg by mouth daily. Takes 4 weekly)     Calcium Carbonate (CALCIUM 600 PO) Take 600 mg by mouth daily.     Cholecalciferol (VITAMIN D3) 25 MCG (1000 UT) CAPS Take 1,000 Units by mouth daily.     Cyanocobalamin (VITAMIN B 12 PO) Take 500 mg by mouth daily.     metoprolol succinate (TOPROL-XL) 50 MG 24 hr tablet Take 1 tablet (50 mg total) by mouth daily. Take with or immediately following a meal. 90 tablet 1   OVER THE COUNTER MEDICATION Take 500 mg by mouth daily.     Turmeric Curcumin 500 MG CAPS Take 1,500 mg by mouth daily.     vitamin E 180 MG (400 UNITS) capsule Take 400 Units by mouth daily.     nitroGLYCERIN (NITROSTAT) 0.4 MG SL tablet Place 1 tablet (0.4 mg  total) under the tongue every 5 (five) minutes as needed for chest pain. 90 tablet 3   No facility-administered medications prior to visit.    Allergies  Allergen Reactions   Tape Other (See Comments)    BANDAIDS AND ADHESIVE TAPES TEAR SKIN (VERY THIN SKIN)  REQUESTING HYPOALLERGENIC TAPE.  "OUCHLESS TAPE." IS OK    Cortisone Other (See Comments)    Flushing and worsening joint pain    Meloxicam Other (See Comments)    Too sleepy    Ace Inhibitors Cough   Atorvastatin Other (See Comments)    Possible allergy   Gabapentin Other (See Comments)    Caused leg cramping   Losartan Other (See Comments)    Sweats, cramps   Vicodin [Hydrocodone-Acetaminophen] Other (See Comments)    Pt states that she stops breathing when she takes  Pain meds    ROS Review of Systems    Objective:    Physical Exam  BP (!) 114/53 (BP Location: Left Arm, Patient Position: Sitting, Cuff Size: Normal)   Pulse 68   Resp 20   Ht 4'  10" (1.473 m)   Wt 104 lb (47.2 kg)   SpO2 97%   BMI 21.74 kg/m  Wt Readings from Last 3 Encounters:  03/19/21 104 lb (47.2 kg)  03/05/21 104 lb 11.2 oz (47.5 kg)  02/11/21 106 lb (48.1 kg)     There are no preventive care reminders to display for this patient.  There are no preventive care reminders to display for this patient.  Lab Results  Component Value Date   TSH 6.92 (H) 03/05/2021   Lab Results  Component Value Date   WBC 7.9 03/05/2021   HGB 12.8 03/05/2021   HCT 37.6 03/05/2021   MCV 92.2 03/05/2021   PLT 365 03/05/2021   Lab Results  Component Value Date   NA 137 03/05/2021   K 4.9 03/05/2021   CO2 27 03/05/2021   GLUCOSE 93 03/05/2021   BUN 15 03/05/2021   CREATININE 0.96 (H) 03/05/2021   BILITOT 0.2 03/05/2021   ALKPHOS 66 01/24/2017   AST 18 03/05/2021   ALT 20 03/05/2021   PROT 7.5 03/05/2021   ALBUMIN 3.7 01/24/2017   CALCIUM 9.7 03/05/2021   Lab Results  Component Value Date   CHOL 272 (H) 03/05/2021   Lab Results  Component Value Date   HDL 80 03/05/2021   Lab Results  Component Value Date   LDLCALC 162 (H) 03/05/2021   Lab Results  Component Value Date   TRIG 152 (H) 03/05/2021   Lab Results  Component Value Date   CHOLHDL 3.4 03/05/2021   Lab Results  Component Value Date   HGBA1C 5.6 01/25/2019      Assessment & Plan:  First BP 114/53, after sitting for 10 minutes second BP is 108/56. Pt agreed to monitor BP at home and return if feeling symptomatic.  Problem List Items Addressed This Visit   None Visit Diagnoses     Essential hypertension    -  Primary       No orders of the defined types were placed in this encounter.   Follow-up: Return if feeling symptomatic.    Ninfa Meeker, CMA

## 2021-03-29 ENCOUNTER — Ambulatory Visit (INDEPENDENT_AMBULATORY_CARE_PROVIDER_SITE_OTHER): Payer: Medicare Other | Admitting: Osteopathic Medicine

## 2021-03-29 DIAGNOSIS — Z0000 Encounter for general adult medical examination without abnormal findings: Secondary | ICD-10-CM

## 2021-03-29 DIAGNOSIS — Z780 Asymptomatic menopausal state: Secondary | ICD-10-CM | POA: Diagnosis not present

## 2021-03-29 NOTE — Patient Instructions (Addendum)
  [place] Maintenance Summary and Written Plan of Care  [name] ,  Thank you for allowing me to perform your Medicare Annual Wellness Visit and for your ongoing commitment to your health.   Health Maintenance & Immunization History Health Maintenance  Topic Date Due   INFLUENZA VACCINE  05/15/2021 (Originally 03/29/2021)   TETANUS/TDAP  03/05/2022 (Originally 08/28/2020)   Zoster Vaccines- Shingrix (1 of 2) 06/05/2022 (Originally 09/07/1959)   COVID-19 Vaccine (5 - Booster for Pfizer series) 05/07/2021   DEXA SCAN  Completed   PNA vac Low Risk Adult  Completed   HPV VACCINES  Aged Out   Immunization History  Administered Date(s) Administered   Fluad Quad(high Dose 65+) 04/29/2019   HiB (PRP-OMP) 07/04/2017   Influenza Split 05/29/2010, 05/18/2011, 05/15/2012   Influenza, High Dose Seasonal PF 04/26/2016, 05/06/2017, 07/04/2017, 05/03/2018   Influenza,inj,Quad PF,6+ Mos 07/06/2020   Influenza-Unspecified 05/29/2010, 05/18/2011, 05/15/2012, 05/29/2013, 05/28/2014, 05/01/2015, 06/29/2016   Meningococcal Conjugate 11/07/2017, 01/02/2018   PFIZER Comirnaty(Gray Top)Covid-19 Tri-Sucrose Vaccine 01/04/2021   PFIZER(Purple Top)SARS-COV-2 Vaccination 09/19/2019, 10/10/2019, 06/02/2020   Pneumococcal Conjugate-13 04/26/2016, 07/04/2017   Pneumococcal Polysaccharide-23 03/30/2007, 11/07/2017   Pneumococcal-Unspecified 03/30/2007   Tdap 04/19/2008   Zoster, Live 08/29/2008    These are the patient goals that we discussed:  Goals Addressed               This Visit's Progress     Patient Stated (pt-stated)        03/29/2021 AWV Goal: Improved Nutrition/Diet  Patient will verbalize understanding that diet plays an important role in overall health and that a poor diet is a risk factor for many chronic medical conditions.  Over the next year, patient will improve self management of their diet by incorporating more water. Patient will utilize available  community resources to help with food acquisition if needed (ex: food pantries, [address], etc) Patient will work with nutrition specialist if a referral was made          This is a list of Health Maintenance Items that are overdue or due now: Influenza vaccine Td vaccine Bone densitometry screening Shingles vaccine  Orders/Referrals Placed Today: Orders Placed This Encounter  Procedures   DEXAScan    Standing Status:   Future    Standing Expiration Date:   03/29/2022    Scheduling Instructions:     Please call patient to schedule.    Order Specific Question:   Reason for exam:    Answer:   Osteoporosis screening    Order Specific Question:   Preferred imaging location?    Answer:   [facility]   (Contact our referral department at [phone] if you have not spoken with someone about your referral appointment within the next 5 days)    Follow-up Plan Follow-up with Emeterio Reeve, DO as planned Schedule your shingles vaccine and your tetanus vaccine at your pharmacy.  Bone Density scan referral has been sent and they will call you reschedule.  Medicare wellness visit in one year. Patient stated she will access AVS on mychart.

## 2021-03-29 NOTE — Progress Notes (Signed)
[place] VISIT  03/29/2021  Telephone Visit Disclaimer This Medicare AWV was conducted by telephone due to national recommendations for restrictions regarding the COVID-19 Pandemic (e.g. social distancing).  I verified, using two identifiers, that I am speaking with Mercedes Decker or their authorized healthcare agent. I discussed the limitations, risks, security, and privacy concerns of performing an evaluation and management service by telephone and the potential availability of an in-person appointment in the future. The patient expressed understanding and agreed to proceed.  Location of Patient: Home Location of Provider (nurse):  In the office.  Subjective:    Mercedes Decker is a 80 y.o. female patient of Mercedes Reeve, DO who had a Medicare Annual Wellness Visit today via telephone. Mercedes Decker is Retired and lives with their spouse. she has 1 children. she reports that she is socially active and does interact with friends/family regularly. she is moderately physically active and enjoys gardening and swimming.  Patient Care Team: Mercedes Reeve, DO as PCP - General (Osteopathic Medicine) Mercedes Salines, DO as PCP - Cardiology (Cardiology)  Advanced Directives 03/29/2021 12/03/2018 10/24/2018 07/10/2018  Does Patient Have a Medical Advance Directive? Yes Yes Yes Yes  Type of Advance Directive Living will;Healthcare Power of [place];Living will [place];Living will [place];Living will  Does patient want to make changes to medical advance directive? No - Guardian declined No - Patient declined - -  Copy of [place] in Chart? Yes - validated most recent copy scanned in chart (See row information) Yes - validated most recent copy scanned in chart (See row information) No - copy requested No - copy requested    Hospital Utilization Over the Past 12 Months: # of hospitalizations or  ER visits: 0 # of surgeries: 0  Review of Systems    Patient reports that her overall health is better compared to last year.  History obtained from chart review and the patient  Patient Reported Readings (BP, Pulse, CBG, Weight, etc) none  Pain Assessment Pain : No/denies pain     Current Medications & Allergies (verified) Allergies as of 03/29/2021       Reactions   Tape Other (See Comments)   BANDAIDS AND ADHESIVE TAPES TEAR SKIN (VERY THIN SKIN)  REQUESTING HYPOALLERGENIC TAPE.  "OUCHLESS TAPE." IS OK   Cortisone Other (See Comments)   Flushing and worsening joint pain   Meloxicam Other (See Comments)   Too sleepy   Ace Inhibitors Cough   Atorvastatin Other (See Comments)   Possible allergy   Gabapentin Other (See Comments)   Caused leg cramping   Losartan Other (See Comments)   Sweats, cramps   Vicodin [hydrocodone-acetaminophen] Other (See Comments)   Pt states that she stops breathing when she takes  Pain meds        Medication List        Accurate as of March 29, 2021  9:36 AM. If you have any questions, ask your nurse or doctor.          acetaminophen 500 MG tablet Commonly known as: TYLENOL Take 500 mg by mouth every 8 (eight) hours as needed (for pain).   amLODipine 5 MG tablet Commonly known as: NORVASC TAKE 1 TABLET(5 MG) BY MOUTH DAILY.   aspirin EC 81 MG tablet Take 1 tablet (81 mg total) by mouth daily. What changed: additional instructions   CALCIUM 600 PO Take 600 mg by mouth daily.   metoprolol  succinate 50 MG 24 hr tablet Commonly known as: TOPROL-XL Take 1 tablet (50 mg total) by mouth daily. Take with or immediately following a meal.   nitroGLYCERIN 0.4 MG SL tablet Commonly known as: Nitrostat Place 1 tablet (0.4 mg total) under the tongue every 5 (five) minutes as needed for chest pain.   OVER THE COUNTER MEDICATION Take 500 mg by mouth daily. [place] - Mushroom type vitamin for the brain.   Turmeric Curcumin 500 MG  Caps Take 1,500 mg by mouth daily.   VITAMIN B 12 PO Take 500 mg by mouth daily.   Vitamin C 125 MG Chew Chew 125 mg by mouth daily.   Vitamin D3 25 MCG (1000 UT) Caps Take 1,000 Units by mouth daily.   vitamin E 180 MG (400 UNITS) capsule Take 400 Units by mouth daily.        History (reviewed): Past Medical History:  Diagnosis Date   Arthritis    Cataract Both removed   Clotting disorder ([place])    Heart murmur    Hypertension    Migraine    Osteoporosis    Prediabetes 08/28/2018   Past Surgical History:  Procedure Laterality Date   ABDOMINAL HYSTERECTOMY     BACK SURGERY     BREAST SURGERY     BUNIONECTOMY     HERNIA REPAIR     INCISIONAL HERNIA REPAIR     KNEE ARTHROSCOPY     SPLENECTOMY, TOTAL  2019   Family History  Problem Relation Age of Onset   Alzheimer's disease Mother    Arthritis Mother    Heart disease Father    Hearing loss Father    Cancer Sister    Diabetes Sister    Heart disease Brother    Diabetes Brother    Cancer Brother    Arthritis Sister    Arthritis Daughter    Hearing loss Brother    Hearing loss Sister    Heart disease Brother    Stroke Sister    Social History   Socioeconomic History   Marital status: Married    Spouse name: Purcell Nails   Number of children: 1   Years of education: 12   Highest education level: 12th grade  Occupational History   Occupation: Firefighter    Comment: retired  Tobacco Use   Smoking status: Never   Smokeless tobacco: Never   Tobacco comments:    none  Vaping Use   Vaping Use: Never used  Substance and Sexual Activity   Alcohol use: Yes    Comment: rarely   Drug use: No   Sexual activity: Not Currently    Birth control/protection: Post-menopausal  Other Topics Concern   Not on file  Social History Narrative   Retired. Lives with her husband and her dog. 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 Works around [employer]. Walks daily. No caffeine   Social Determinants of Adult nurse Strain: Low Risk    Difficulty of Paying Living Expenses: Not hard at all  Food Insecurity: No Food Insecurity   Worried About Charity fundraiser in the Last Year: Never true   Arboriculturist in the Last Year: Never true  Transportation Needs: No Transportation Needs   Lack of Transportation (Medical): No   Lack of Transportation (Non-Medical): No  Physical Activity: Sufficiently Active   Days of Exercise per Week: 6 days   Minutes of Exercise per Session: 60 min  Stress: No Stress Concern  Present   Feeling of Stress : Only a little  Social Connections: Moderately Integrated   Frequency of Communication with Friends and Family: More than three times a week   Frequency of Social Gatherings with Friends and Family: Three times a week   Attends Religious Services: More than 4 times per year   Active Member of Clubs or Organizations: No   Attends Archivist Meetings: Never   Marital Status: Married    Activities of Daily Living In your present state of health, do you have any difficulty performing the following activities: 03/29/2021  Hearing? N  Comment has hearing aids but doesn't use them  Vision? N  Difficulty concentrating or making decisions? N  Walking or climbing stairs? N  Dressing or bathing? N  Doing errands, shopping? N  Preparing Food and eating ? N  Using the Toilet? N  In the past six months, have you accidently leaked urine? N  Do you have problems with loss of bowel control? N  Managing your Medications? N  Managing your Finances? N  Housekeeping or managing your Housekeeping? N  Some recent data might be hidden    Patient Education/ Literacy How often do you need to have someone help you when you read instructions, pamphlets, or other written materials from your doctor or pharmacy?: 1 - Never What is the last grade level you completed in school?: High School Graduate  Exercise Current Exercise Habits: Home exercise routine,  Type of exercise: walking;Other - see comments (swimming), Time (Minutes): 60, Frequency (Times/Week): 6, Weekly Exercise (Minutes/Week): 360, Intensity: Moderate, Exercise limited by: None identified  Diet Patient reports consuming 3 meals a day and 0.5 snack(s) a day Patient reports that her primary diet is: Regular Patient reports that she does have regular access to food.   Depression Screen PHQ 2/9 Scores 03/29/2021 03/05/2021 02/04/2019 12/03/2018 08/24/2018 04/23/2018 05/08/2017  PHQ - 2 Score 0 0 0 0 0 0 0  PHQ- 9 Score - - - - 2 0 -     Fall Risk Fall Risk  03/29/2021 03/05/2021 12/03/2018 05/08/2017 03/20/2017  Falls in the past year? 0 0 1 No No  Comment - - - - Emmi Telephone Survey: data to providers prior to load  Number falls in past yr: 0 0 1 - -  Injury with Fall? 0 0 1 - -  Comment - - broke wrist - -  Risk for fall due to : - - History of fall(s) - -  Follow up Falls evaluation completed Falls prevention discussed;Falls evaluation completed Falls prevention discussed - -     Objective:  KADI SATTERTHWAITE seemed alert and oriented and she participated appropriately during our telephone visit.  Blood Pressure Weight BMI  BP Readings from Last 3 Encounters:  03/19/21 (!) 108/56  03/05/21 (!) 144/67  02/11/21 140/66   Wt Readings from Last 3 Encounters:  03/19/21 104 lb (47.2 kg)  03/05/21 104 lb 11.2 oz (47.5 kg)  02/11/21 106 lb (48.1 kg)   BMI Readings from Last 1 Encounters:  03/19/21 21.74 kg/m    *Unable to obtain current vital signs, weight, and BMI due to telephone visit type  Hearing/Vision  A[place] did not seem to have difficulty with hearing/understanding during the telephone conversation Reports that she has had a formal eye exam by an eye care professional within the past year Reports that she has not had a formal hearing evaluation within the past year *Unable to fully assess hearing and vision  during telephone visit type  Cognitive Function: 6CIT Screen  03/29/2021 12/03/2018  What Year? 0 points 0 points  What month? 0 points 0 points  What time? 0 points 0 points  Count back from 20 0 points 0 points  Months in reverse 0 points 0 points  Repeat phrase 0 points 2 points  Total Score 0 2   (Normal:0-7, Significant for Dysfunction: >8)  Normal Cognitive Function Screening: Yes   Immunization & Health Maintenance Record Immunization History  Administered Date(s) Administered   Fluad Quad(high Dose 65+) 04/29/2019   HiB (PRP-OMP) 07/04/2017   Influenza Split 05/29/2010, 05/18/2011, 05/15/2012   Influenza, High Dose Seasonal PF 04/26/2016, 05/06/2017, 07/04/2017, 05/03/2018   Influenza,inj,Quad PF,6+ Mos 07/06/2020   Influenza-Unspecified 05/29/2010, 05/18/2011, 05/15/2012, 05/29/2013, 05/28/2014, 05/01/2015, 06/29/2016   Meningococcal Conjugate 11/07/2017, 01/02/2018   PFIZER Comirnaty(Gray Top)Covid-19 Tri-Sucrose Vaccine 01/04/2021   PFIZER(Purple Top)SARS-COV-2 Vaccination 09/19/2019, 10/10/2019, 06/02/2020   Pneumococcal Conjugate-13 04/26/2016, 07/04/2017   Pneumococcal Polysaccharide-23 03/30/2007, 11/07/2017   Pneumococcal-Unspecified 03/30/2007   Tdap 04/19/2008   Zoster, Live 08/29/2008    Health Maintenance  Topic Date Due   INFLUENZA VACCINE  05/15/2021 (Originally 03/29/2021)   TETANUS/TDAP  03/05/2022 (Originally 08/28/2020)   Zoster Vaccines- Shingrix (1 of 2) 06/05/2022 (Originally 09/07/1959)   COVID-19 Vaccine (5 - Booster for Pfizer series) 05/07/2021   DEXA SCAN  Completed   PNA vac Low Risk Adult  Completed   HPV VACCINES  Aged Out       Assessment  This is a routine wellness examination for [employer].  Health Maintenance: Due or Overdue There are no preventive care reminders to display for this patient.   Mercedes Decker does not need a referral for Community Assistance: Care Management:   no Social Work:    no Prescription Assistance:  no Nutrition/Diabetes Education:  no   Plan:   Personalized Goals  Goals Addressed               This Visit's Progress     Patient Stated (pt-stated)        03/29/2021 AWV Goal: Improved Nutrition/Diet  Patient will verbalize understanding that diet plays an important role in overall health and that a poor diet is a risk factor for many chronic medical conditions.  Over the next year, patient will improve self management of their diet by incorporating more water. Patient will utilize available community resources to help with food acquisition if needed (ex: food pantries, [address], etc) Patient will work with nutrition specialist if a referral was made        Personalized Health Maintenance & Screening Recommendations  Influenza vaccine Td vaccine Bone densitometry screening Shingles vaccine  Lung Cancer Screening Recommended: no (Low Dose CT Chest recommended if Age 42-80 years, 30 pack-year currently smoking OR have quit w/in past 15 years) Hepatitis C Screening recommended: no HIV Screening recommended: no  Advanced Directives: Written information was not prepared per patient's request.  Referrals & Orders Orders Placed This Encounter  Procedures   [place]     Follow-up Plan Follow-up with Mercedes Reeve, DO as planned Schedule your shingles vaccine and your tetanus vaccine at your pharmacy.  Bone Density scan referral has been sent and they will call you reschedule.  Medicare wellness visit in one year. Patient stated she will access AVS on mychart.   I have personally reviewed and noted the following in the patient's chart:   Medical and social history Use of alcohol, tobacco or illicit drugs  Current medications and supplements Functional ability and status Nutritional status Physical activity Advanced directives List of other physicians Hospitalizations, surgeries, and ER visits in previous 12 months Vitals Screenings to include cognitive, depression, and falls Referrals and  appointments  In addition, I have reviewed and discussed with Mercedes Decker certain preventive protocols, quality metrics, and best practice recommendations. A written personalized care plan for preventive services as well as general preventive health recommendations is available and can be mailed to the patient at her request.      Tinnie Gens, RN  03/29/2021

## 2021-04-15 DIAGNOSIS — J0301 Acute recurrent streptococcal tonsillitis: Secondary | ICD-10-CM | POA: Diagnosis not present

## 2021-04-15 DIAGNOSIS — D8481 Immunodeficiency due to conditions classified elsewhere: Secondary | ICD-10-CM | POA: Diagnosis not present

## 2021-04-15 DIAGNOSIS — Z831 Family history of other infectious and parasitic diseases: Secondary | ICD-10-CM | POA: Diagnosis not present

## 2021-04-15 DIAGNOSIS — Z8261 Family history of arthritis: Secondary | ICD-10-CM | POA: Diagnosis not present

## 2021-04-15 DIAGNOSIS — Z87440 Personal history of urinary (tract) infections: Secondary | ICD-10-CM | POA: Diagnosis not present

## 2021-04-20 ENCOUNTER — Other Ambulatory Visit: Payer: Self-pay

## 2021-04-20 ENCOUNTER — Telehealth: Payer: Self-pay | Admitting: Osteopathic Medicine

## 2021-04-20 DIAGNOSIS — E0590 Thyrotoxicosis, unspecified without thyrotoxic crisis or storm: Secondary | ICD-10-CM

## 2021-04-20 DIAGNOSIS — E611 Iron deficiency: Secondary | ICD-10-CM

## 2021-04-20 NOTE — Telephone Encounter (Signed)
-----   Message from Emeterio Reeve, DO sent at 03/09/2021  2:14 PM EDT ----- Please call patient: due to recheck labs, Please place orders for TSH, Free T4, Iron/TIBC/Ferritin for diagnoses subclinical hypothyroidism and iron deficiency. Please call patient to see if she'd liek to go to lab downstairs or schedule something here. THanks!

## 2021-04-20 NOTE — Telephone Encounter (Signed)
Attempted to contact patient regarding provider's note. Patient was not available at time of call, per husband. Informed patient's husband that she is due to repeat labs. Patient has an appt tomorrow to complete a Dexa scan. Husband stated that he will inform her to stop by the lab after her Dexa scan appointment. As requested by provider, lab order placed.

## 2021-04-21 ENCOUNTER — Other Ambulatory Visit: Payer: Self-pay

## 2021-04-21 ENCOUNTER — Ambulatory Visit (INDEPENDENT_AMBULATORY_CARE_PROVIDER_SITE_OTHER): Payer: Medicare Other

## 2021-04-21 DIAGNOSIS — M810 Age-related osteoporosis without current pathological fracture: Secondary | ICD-10-CM | POA: Diagnosis not present

## 2021-04-21 DIAGNOSIS — M8588 Other specified disorders of bone density and structure, other site: Secondary | ICD-10-CM | POA: Diagnosis not present

## 2021-04-21 DIAGNOSIS — Z780 Asymptomatic menopausal state: Secondary | ICD-10-CM | POA: Diagnosis not present

## 2021-04-21 DIAGNOSIS — Z0000 Encounter for general adult medical examination without abnormal findings: Secondary | ICD-10-CM

## 2021-04-21 DIAGNOSIS — E0590 Thyrotoxicosis, unspecified without thyrotoxic crisis or storm: Secondary | ICD-10-CM | POA: Diagnosis not present

## 2021-04-21 DIAGNOSIS — E611 Iron deficiency: Secondary | ICD-10-CM | POA: Diagnosis not present

## 2021-04-22 LAB — TSH+FREE T4: TSH W/REFLEX TO FT4: 3.62 mIU/L (ref 0.40–4.50)

## 2021-04-22 LAB — IRON,TIBC AND FERRITIN PANEL
%SAT: 23 % (calc) (ref 16–45)
Ferritin: 17 ng/mL (ref 16–288)
Iron: 89 ug/dL (ref 45–160)
TIBC: 392 mcg/dL (calc) (ref 250–450)

## 2021-05-19 DIAGNOSIS — Z23 Encounter for immunization: Secondary | ICD-10-CM | POA: Diagnosis not present

## 2021-05-31 DIAGNOSIS — L821 Other seborrheic keratosis: Secondary | ICD-10-CM | POA: Diagnosis not present

## 2021-05-31 DIAGNOSIS — D225 Melanocytic nevi of trunk: Secondary | ICD-10-CM | POA: Diagnosis not present

## 2021-05-31 DIAGNOSIS — L570 Actinic keratosis: Secondary | ICD-10-CM | POA: Diagnosis not present

## 2021-05-31 DIAGNOSIS — L814 Other melanin hyperpigmentation: Secondary | ICD-10-CM | POA: Diagnosis not present

## 2021-05-31 DIAGNOSIS — L578 Other skin changes due to chronic exposure to nonionizing radiation: Secondary | ICD-10-CM | POA: Diagnosis not present

## 2021-07-28 DIAGNOSIS — D689 Coagulation defect, unspecified: Secondary | ICD-10-CM | POA: Insufficient documentation

## 2021-07-28 DIAGNOSIS — H16223 Keratoconjunctivitis sicca, not specified as Sjogren's, bilateral: Secondary | ICD-10-CM | POA: Diagnosis not present

## 2021-07-28 DIAGNOSIS — H01009 Unspecified blepharitis unspecified eye, unspecified eyelid: Secondary | ICD-10-CM | POA: Diagnosis not present

## 2021-07-28 DIAGNOSIS — H269 Unspecified cataract: Secondary | ICD-10-CM | POA: Insufficient documentation

## 2021-07-28 DIAGNOSIS — H00012 Hordeolum externum right lower eyelid: Secondary | ICD-10-CM | POA: Diagnosis not present

## 2021-08-04 NOTE — Progress Notes (Signed)
Cardiology Office Note:    Date:  08/05/2021   ID:  Mercedes Decker, DOB 04/19/1941, MRN [id number]  PCP:  Emeterio Reeve, DO  Cardiologist:  Shirlee More, MD    Referring MD: Emeterio Reeve, DO    ASSESSMENT:    1. Chest pain, unspecified type   2. Primary hypertension   3. Mixed hyperlipidemia    PLAN:    In order of problems listed above:  Myocardial perfusion study is reassuring normal places her at low risk over the next 2 years in the absence of recurrent symptoms at this time I would not do any further ischemia evaluation.  If problematic symptoms recur cardiac CTA would be appropriate with a normal perfusion test. Continue current antihypertensive She agrees to try nonstatin Zetia usually has an effect about 25% LDL reduction and I will have her report to her PCP office in 2 months for follow-up lipid profile   Next appointment: She will sign up for my chart she can send message to me and I will see back in the office as needed   Medication Adjustments/Labs and Tests Ordered: Current medicines are reviewed at length with the patient today.  Concerns regarding medicines are outlined above.  No orders of the defined types were placed in this encounter.  Meds ordered this encounter  Medications   ezetimibe (ZETIA) 10 MG tablet    Sig: Take 1 tablet (10 mg total) by mouth daily.    Dispense:  90 tablet    Refill:  3   Chief complaint: Follow-up after testing History of Present Illness:    Mercedes Decker is a 80 y.o. female with a hx of hypertension hyperlipidemia with statin intolerance prediabetes chest pain and previous TIA last seen 02/11/2021.  Compliance with diet, lifestyle and medications: Yes  She is seen today in follow-up after undergoing myocardial perfusion study that was normal she has had no recurrent chest pain.  In retrospect the problem was the intense stress of her husband's Parkinson's disease and progressive deterioration. Blood  pressure is at target on current treatment she follows at home systolics less than 270. She is not on lipid-lowering therapy because of statin intolerance and we will give her a trial of Zetia.   She had a myocardial perfusion study performed 02/25/2021 showing normal left ventricular function EF 79% no ischemic ST changes and normal myocardial perfusion, a low risk test.  Echocardiogram performed 01/13/2021 showed normal left ventricular size function EF 65% mild LVH normal right ventricular size function and  function and pulmonary artery pressure and no valvular abnormality.  Past Medical History:  Diagnosis Date   Arthritis    Cataract Both removed   Clotting disorder ([place])    Heart murmur    Hypertension    Migraine    Osteoporosis    Prediabetes 08/28/2018    Past Surgical History:  Procedure Laterality Date   ABDOMINAL HYSTERECTOMY     BACK SURGERY     BREAST SURGERY     BUNIONECTOMY     HERNIA REPAIR     INCISIONAL HERNIA REPAIR     KNEE ARTHROSCOPY     SPLENECTOMY, TOTAL  2019    Current Medications: Current Meds  Medication Sig   acetaminophen (TYLENOL) 500 MG tablet Take 500 mg by mouth every 8 (eight) hours as needed (for pain).   amLODipine (NORVASC) 5 MG tablet TAKE 1 TABLET(5 MG) BY MOUTH DAILY.   Ascorbic Acid (VITAMIN C) 125 MG CHEW Chew 125 mg  by mouth daily.   aspirin EC 81 MG tablet Take 1 tablet (81 mg total) by mouth daily. (Patient taking differently: Take 81 mg by mouth daily. Takes 4 weekly)   Calcium Carbonate (CALCIUM 600 PO) Take 600 mg by mouth daily.   Cholecalciferol (VITAMIN D3) 25 MCG (1000 UT) CAPS Take 1,000 Units by mouth daily.   Cyanocobalamin (VITAMIN B 12 PO) Take 500 mg by mouth daily.   ezetimibe (ZETIA) 10 MG tablet Take 1 tablet (10 mg total) by mouth daily.   metoprolol succinate (TOPROL-XL) 50 MG 24 hr tablet Take 1 tablet (50 mg total) by mouth daily. Take with or immediately following a meal.   nitroGLYCERIN (NITROSTAT) 0.4  MG SL tablet Place 1 tablet (0.4 mg total) under the tongue every 5 (five) minutes as needed for chest pain.   OVER THE COUNTER MEDICATION Take 500 mg by mouth daily. [place] - Mushroom type vitamin for the brain.   Turmeric Curcumin 500 MG CAPS Take 1,500 mg by mouth daily.   vitamin E 180 MG (400 UNITS) capsule Take 400 Units by mouth daily.     Allergies:   Tape, Cortisone, Meloxicam, Ace inhibitors, Atorvastatin, Gabapentin, Losartan, and Vicodin [hydrocodone-acetaminophen]   Social History   Socioeconomic History   Marital status: Married    Spouse name: Lawrence   Number of children: 1   Years of education: 12   Highest education level: 12th grade  Occupational History   Occupation: Firefighter    Comment: retired  Tobacco Use   Smoking status: Never   Smokeless tobacco: Never   Tobacco comments:    none  Vaping Use   Vaping Use: Never used  Substance and Sexual Activity   Alcohol use: Yes    Comment: rarely   Drug use: No   Sexual activity: Not Currently    Birth control/protection: Post-menopausal  Other Topics Concern   Not on file  Social History Narrative   Retired. Lives with her husband and her dog. 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 Works around [employer]. Walks daily. No caffeine   Social Determinants of Radio broadcast assistant Strain: Low Risk    Difficulty of Paying Living Expenses: Not hard at all  Food Insecurity: No Food Insecurity   Worried About Charity fundraiser in the Last Year: Never true   Arboriculturist in the Last Year: Never true  Transportation Needs: No Transportation Needs   Lack of Transportation (Medical): No   Lack of Transportation (Non-Medical): No  Physical Activity: Sufficiently Active   Days of Exercise per Week: 6 days   Minutes of Exercise per Session: 60 min  Stress: No Stress Concern Present   Feeling of Stress : Only a little  Social Connections: Moderately Integrated   Frequency of Communication with  Friends and Family: More than three times a week   Frequency of Social Gatherings with Friends and Family: Three times a week   Attends Religious Services: More than 4 times per year   Active Member of Clubs or Organizations: No   Attends Archivist Meetings: Never   Marital Status: Married     Family History: The patient's family history includes Alzheimer's disease in her mother; Arthritis in her daughter, mother, and sister; Cancer in her brother and sister; Diabetes in her brother and sister; Hearing loss in her brother, father, and sister; Heart disease in her brother, brother, and father; Stroke in her sister. ROS:   Please  see the history of present illness.    All other systems reviewed and are negative.  EKGs/Labs/Other Studies Reviewed:    The following studies were reviewed today  Recent Labs: 03/05/2021: ALT 20; BUN 15; Creat 0.96; Hemoglobin 12.8; Magnesium 2.5; Platelets 365; Potassium 4.9; Sodium 137; TSH 6.92  Recent Lipid Panel    Component Value Date/Time   CHOL 272 (H) 03/05/2021 0000   TRIG 152 (H) 03/05/2021 0000   HDL 80 03/05/2021 0000   CHOLHDL 3.4 03/05/2021 0000   VLDL 26 01/24/2017 0908   LDLCALC 162 (H) 03/05/2021 0000    Physical Exam:    VS:  BP (!) 154/70   Pulse 74   Ht 4\' 10"  (1.473 m)   Wt 109 lb (49.4 kg)   SpO2 96%   BMI 22.78 kg/m     Wt Readings from Last 3 Encounters:  08/05/21 109 lb (49.4 kg)  03/19/21 104 lb (47.2 kg)  03/05/21 104 lb 11.2 oz (47.5 kg)     GEN:  Well nourished, well developed in no acute distress HEENT: Normal NECK: No JVD; No carotid bruits LYMPHATICS: No lymphadenopathy CARDIAC: RRR, no murmurs, rubs, gallops RESPIRATORY:  Clear to auscultation without rales, wheezing or rhonchi  ABDOMEN: Soft, non-tender, non-distended MUSCULOSKELETAL:  No edema; No deformity  SKIN: Warm and dry NEUROLOGIC:  Alert and oriented x 3 PSYCHIATRIC:  Normal affect    Signed, Shirlee More, MD  08/05/2021 8:49  AM    [facility]

## 2021-08-05 ENCOUNTER — Other Ambulatory Visit: Payer: Self-pay

## 2021-08-05 ENCOUNTER — Ambulatory Visit (INDEPENDENT_AMBULATORY_CARE_PROVIDER_SITE_OTHER): Payer: Medicare Other | Admitting: Cardiology

## 2021-08-05 ENCOUNTER — Encounter: Payer: Self-pay | Admitting: Cardiology

## 2021-08-05 VITALS — BP 154/70 | HR 74 | Ht <= 58 in | Wt 109.0 lb

## 2021-08-05 DIAGNOSIS — I10 Essential (primary) hypertension: Secondary | ICD-10-CM | POA: Diagnosis not present

## 2021-08-05 DIAGNOSIS — E782 Mixed hyperlipidemia: Secondary | ICD-10-CM

## 2021-08-05 DIAGNOSIS — R079 Chest pain, unspecified: Secondary | ICD-10-CM | POA: Diagnosis not present

## 2021-08-05 MED ORDER — EZETIMIBE 10 MG PO TABS: 10.0000 mg | ORAL_TABLET | Freq: Every day | ORAL | 3 refills | Status: AC

## 2021-08-05 NOTE — Patient Instructions (Signed)
Medication Instructions:  Your physician has recommended you make the following change in your medication:  START: Zetia 10 mg take one tablet by mouth daily.  *If you need a refill on your cardiac medications before your next appointment, please call your pharmacy*   Lab Work: Your physician recommends that you return for lab work in: 2 months CMP, Lipids If you have labs (blood work) drawn today and your tests are completely normal, you will receive your results only by: [place] (if you have [place]) OR A paper copy in the mail If you have any lab test that is abnormal or we need to change your treatment, we will call you to review the results.   Testing/Procedures: nONE   Follow-Up: At [facility], you and your health needs are our priority.  As part of our continuing mission to provide you with exceptional heart care, we have created designated Provider Care Teams.  These Care Teams include your primary Cardiologist (physician) and Advanced Practice Providers (APPs -  Physician Assistants and Nurse Practitioners) who all work together to provide you with the care you need, when you need it.  We recommend signing up for the patient portal called "MyChart".  Sign up information is provided on this After Visit Summary.  MyChart is used to connect with patients for Virtual Visits (Telemedicine).  Patients are able to view lab/test results, encounter notes, upcoming appointments, etc.  Non-urgent messages can be sent to your provider as well.   To learn more about what you can do with MyChart, go to [email or web address].    Your next appointment:   As needed  The format for your next appointment:   In Person  Provider:   Shirlee More, MD    Other Instructions

## 2021-08-09 ENCOUNTER — Other Ambulatory Visit: Payer: Self-pay

## 2021-08-09 ENCOUNTER — Encounter: Payer: Self-pay | Admitting: Medical-Surgical

## 2021-08-09 ENCOUNTER — Ambulatory Visit (INDEPENDENT_AMBULATORY_CARE_PROVIDER_SITE_OTHER): Payer: Medicare Other | Admitting: Medical-Surgical

## 2021-08-09 VITALS — BP 149/71 | HR 76 | Resp 20 | Ht <= 58 in | Wt 108.0 lb

## 2021-08-09 DIAGNOSIS — G2581 Restless legs syndrome: Secondary | ICD-10-CM | POA: Diagnosis not present

## 2021-08-09 DIAGNOSIS — Z7689 Persons encountering health services in other specified circumstances: Secondary | ICD-10-CM | POA: Diagnosis not present

## 2021-08-09 DIAGNOSIS — K219 Gastro-esophageal reflux disease without esophagitis: Secondary | ICD-10-CM

## 2021-08-09 DIAGNOSIS — G43909 Migraine, unspecified, not intractable, without status migrainosus: Secondary | ICD-10-CM

## 2021-08-09 DIAGNOSIS — M810 Age-related osteoporosis without current pathological fracture: Secondary | ICD-10-CM

## 2021-08-09 DIAGNOSIS — E038 Other specified hypothyroidism: Secondary | ICD-10-CM | POA: Diagnosis not present

## 2021-08-09 DIAGNOSIS — I10 Essential (primary) hypertension: Secondary | ICD-10-CM | POA: Diagnosis not present

## 2021-08-09 DIAGNOSIS — R7303 Prediabetes: Secondary | ICD-10-CM | POA: Diagnosis not present

## 2021-08-09 NOTE — Progress Notes (Signed)
  HPI with pertinent ROS:   CC: transfer of care, 6 month follow up  HPI: Pleasant 80 year old female presenting today to transfer care to a new PCP and for the following:  HTN-taking amlodipine 5 mg and Toprol XL 50 mg daily, tolerating both medications well without side effects.  Check her at home with readings similar to an office readings today followed by cardiology.  GERD-not currently taking any medications.  Denies any issues with reflux at this time.  RLS-has significant difficulties with restless leg syndrome.  Notes that lying on her stomach at night does tend to help.  She has gabapentin that she picked up however she has not started this yet.  She read the side effects and noted that it can cause diarrhea in some people so she wanted to wait.  Migraine-doing well on Toprol XL for migraine prophylaxis.  Not having any issues with headaches at this point.  Osteoporosis-had a DEXA scan completed in October showing she does still have osteoporosis.  She was previously treated with Prolia every 6 months through [facility] however her last appointment and injection was in June 2021.  At that time, the note requested her to return in 6 months for follow-up and repeat injection however she never got an appointment and has not been back to see them.  She is interested in restarting Prolia if this is appropriate for osteoporosis.  I reviewed the past medical history, family history, social history, surgical history, and allergies today and no changes were needed.  Please see the problem list section below in epic for further details.   Physical exam:   General: Well Developed, well nourished, and in no acute distress.  Neuro: Alert and oriented x3.  HEENT: Normocephalic, atraumatic.  Skin: Warm and dry. Cardiac: Regular rate and rhythm, no murmurs rubs or gallops, no lower extremity edema.  Respiratory: Clear to auscultation bilaterally. Not using accessory muscles, speaking  in full sentences.  Impression and Recommendations:    1. Encounter to establish care Reviewed available information and discussed care concerns with patient.   2. Benign essential hypertension Checking CBC, CMP, and lipid panel today.  Continue amlodipine and Toprol-XL.  Further management by cardiology. - CBC with Differential - COMPLETE METABOLIC PANEL WITH GFR - Lipid panel  3. Gastroesophageal reflux disease without esophagitis Stable without medications.  4. Prediabetes Checking CMP. - COMPLETE METABOLIC PANEL WITH GFR  5. RLS (restless legs syndrome) Checking CMP.  Encouraged to start gabapentin with dosing nightly 30 to 60 minutes prior to bedtime. - COMPLETE METABOLIC PANEL WITH GFR  6. Migraine without status migrainosus, not intractable, unspecified migraine type Managed with Toprol XL for prophylaxis.  7. Subclinical hypothyroidism Checking TSH - TSH  8. Age-related osteoporosis without current pathological fracture Checking CMP.  If results are good, okay to start every 61-month Prolia injections here in our office.  Return in about 6 months (around 02/07/2022) for chronic disease follow up. ___________________________________________ Clearnce Sorrel, DNP, APRN, FNP-BC [facility] [place]

## 2021-08-16 LAB — CBC WITH DIFFERENTIAL/PLATELET
Absolute Monocytes: 795 cells/uL (ref 200–950)
Basophils Absolute: 107 cells/uL (ref 0–200)
Basophils Relative: 1.3 %
Eosinophils Absolute: 328 cells/uL (ref 15–500)
Eosinophils Relative: 4 %
HCT: 36.1 % (ref 35.0–45.0)
Hemoglobin: 12.5 g/dL (ref 11.7–15.5)
Lymphs Abs: 3665 cells/uL (ref 850–3900)
MCH: 32.4 pg (ref 27.0–33.0)
MCHC: 34.6 g/dL (ref 32.0–36.0)
MCV: 93.5 fL (ref 80.0–100.0)
MPV: 10.9 fL (ref 7.5–12.5)
Monocytes Relative: 9.7 %
Neutro Abs: 3305 cells/uL (ref 1500–7800)
Neutrophils Relative %: 40.3 %
Platelets: 325 10*3/uL (ref 140–400)
RBC: 3.86 10*6/uL (ref 3.80–5.10)
RDW: 12 % (ref 11.0–15.0)
Total Lymphocyte: 44.7 %
WBC: 8.2 10*3/uL (ref 3.8–10.8)

## 2021-08-16 LAB — T3, FREE: T3, Free: 3 pg/mL (ref 2.3–4.2)

## 2021-08-16 LAB — T4 FREE, DIRECT DIALYSIS AND T4 TOTAL
T4, Free Direct Dialysis: 1.6 ng/dL (ref 0.9–2.2)
T4, TOTAL: 8.2 ug/dL (ref 4.8–10.4)

## 2021-08-16 LAB — LIPID PANEL
Cholesterol: 250 mg/dL — ABNORMAL HIGH (ref <200)
HDL: 69 mg/dL (ref >=50)
LDL Cholesterol (Calc): 157 mg/dL (calc) — ABNORMAL HIGH
Non-HDL Cholesterol (Calc): 181 mg/dL (calc) — ABNORMAL HIGH (ref <130)
Total CHOL/HDL Ratio: 3.6 (calc) (ref <5.0)
Triglycerides: 121 mg/dL (ref <150)

## 2021-08-16 LAB — COMPLETE METABOLIC PANEL WITH GFR
AG Ratio: 1.3 (calc) (ref 1.0–2.5)
ALT: 19 U/L (ref 6–29)
AST: 23 U/L (ref 10–35)
Albumin: 4 g/dL (ref 3.6–5.1)
Alkaline phosphatase (APISO): 68 U/L (ref 37–153)
BUN: 20 mg/dL (ref 7–25)
CO2: 29 mmol/L (ref 20–32)
Calcium: 9.8 mg/dL (ref 8.6–10.4)
Chloride: 104 mmol/L (ref 98–110)
Creat: 0.83 mg/dL (ref 0.60–0.95)
Globulin: 3.1 g/dL (calc) (ref 1.9–3.7)
Glucose, Bld: 96 mg/dL (ref 65–99)
Potassium: 5.2 mmol/L (ref 3.5–5.3)
Sodium: 140 mmol/L (ref 135–146)
Total Bilirubin: 0.5 mg/dL (ref 0.2–1.2)
Total Protein: 7.1 g/dL (ref 6.1–8.1)
eGFR: 71 mL/min/{1.73_m2} (ref >=60)

## 2021-08-16 LAB — TSH: TSH: 5.62 mIU/L — ABNORMAL HIGH (ref 0.40–4.50)

## 2021-09-07 ENCOUNTER — Other Ambulatory Visit: Payer: Self-pay

## 2021-09-07 DIAGNOSIS — I10 Essential (primary) hypertension: Secondary | ICD-10-CM

## 2021-09-07 MED ORDER — METOPROLOL SUCCINATE ER 50 MG PO TB24: 50.0000 mg | ORAL_TABLET | Freq: Every day | ORAL | 1 refills | Status: DC

## 2021-11-02 ENCOUNTER — Other Ambulatory Visit: Payer: Self-pay

## 2021-11-02 DIAGNOSIS — I10 Essential (primary) hypertension: Secondary | ICD-10-CM

## 2021-11-02 MED ORDER — AMLODIPINE BESYLATE 5 MG PO TABS: ORAL_TABLET | ORAL | 0 refills | Status: DC

## 2021-12-28 ENCOUNTER — Ambulatory Visit (INDEPENDENT_AMBULATORY_CARE_PROVIDER_SITE_OTHER): Payer: Medicare Other | Admitting: Medical-Surgical

## 2021-12-28 ENCOUNTER — Encounter: Payer: Self-pay | Admitting: Medical-Surgical

## 2021-12-28 VITALS — BP 166/77 | HR 72 | Resp 20 | Ht <= 58 in | Wt 107.1 lb

## 2021-12-28 DIAGNOSIS — R457 State of emotional shock and stress, unspecified: Secondary | ICD-10-CM

## 2021-12-28 DIAGNOSIS — R52 Pain, unspecified: Secondary | ICD-10-CM | POA: Diagnosis not present

## 2021-12-28 DIAGNOSIS — J302 Other seasonal allergic rhinitis: Secondary | ICD-10-CM | POA: Diagnosis not present

## 2021-12-28 DIAGNOSIS — R450 Nervousness: Secondary | ICD-10-CM

## 2021-12-28 DIAGNOSIS — E038 Other specified hypothyroidism: Secondary | ICD-10-CM

## 2021-12-28 NOTE — Progress Notes (Signed)
?  HPI with pertinent ROS:  ? ?CC: malaise, body aches ? ?HPI: ?Pleasant 81 year old female presenting today with various complaints including generalized malaise, a feeling of jitteriness, body aches, and a feeling of something being wrong that has been going on for the last couple of months.  Also notes that in the recent days she has developed postnasal drip, sore throat on the left side when swallowing, and a small dry cough.  She is unsure what is causing her symptoms and is worried that there may be something going on.  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  She is responsible for all of the upkeep around the house as well as monitoring and care for him.  Does not have a good support system in the area and admits to being very stressed out.  She does not have a lot of time for herself for self-care and is aware that she is likely slacking on this.  She is not currently taking any medications for mood management and is not currently doing any counseling.  She is not against counseling however does feel that it would be 1 more responsibility added on that she does not have time for. ? ?I reviewed the past medical history, family history, social history, surgical history, and allergies today and no changes were needed.  Please see the problem list section below in epic for further details. ? ? ?Physical exam:  ? ?General: Well Developed, well nourished, and in no acute distress.  ?Neuro: Alert and oriented x3.  ?HEENT: Normocephalic, atraumatic.  ?Skin: Warm and dry. ?Cardiac: Regular rate and rhythm, no murmurs rubs or gallops, no lower extremity edema.  ?Respiratory: Clear to auscultation bilaterally. Not using accessory muscles, speaking in full sentences. ? ?Impression and Recommendations:   ? ?1. Generalized body aches ?2. Caregiver stress syndrome ?3. Jittery ?Checking labs as below.  Feel that a lot of her symptoms are related to caregiver stress as  well as poor self-care habits.  Discussed the importance of self-care when in a caregiving role.  Discussed options for mood management to help with stress overall.  She would like to look into starting a medication.  Recommend sertraline since I feel this would be a good fit.  Information provided with AVS regarding sertraline for her to investigate.  She will let me know if she decides she wants to start this.  Declined offer for referral for counseling due to time constraints right now but if she changes her mind, she will let me know. ?- CBC with Differential/Platelet ?- COMPLETE METABOLIC PANEL WITH GFR ?- TSH ?- T4, free ? ?4. Subclinical hypothyroidism ?Checking thyroid function. ?- TSH ?- T4, free ? ?5. Seasonal allergies ?The postnasal drip, cough, and sore throat are likely related to seasonal allergies.  Recommend starting a daily oral antihistamine such as Zyrtec, Xyzal, Allegra, or Claritin.  Also recommend starting Flonase or a comparable over-the-counter intranasal steroid to help with symptoms.  Okay to use Tylenol/ibuprofen for sore throat.  If other symptoms arise, advised her to reach out for reevaluation.  Patient verbalized understanding is agreeable to the plan. ? ?Return if symptoms worsen or fail to improve. ?___________________________________________ ?Clearnce Sorrel, DNP, APRN, FNP-BC ?Primary Care and Sports Medicine ?[place] ?

## 2021-12-28 NOTE — Patient Instructions (Signed)
Sertraline Tablets ?What is this medication? ?SERTRALINE (SER tra leen) treats depression, anxiety, obsessive-compulsive disorder (OCD), post-traumatic stress disorder (PTSD), and premenstrual dysphoric disorder (PMDD). It increases the amount of serotonin in the brain, a hormone that helps regulate mood. It belongs to a group of medications called SSRIs. ?This medicine may be used for other purposes; ask your health care provider or pharmacist if you have questions. ?COMMON BRAND NAME(S): Zoloft ?What should I tell my care team before I take this medication? ?They need to know if you have any of these conditions: ?Bleeding disorders ?Bipolar disorder or a family history of bipolar disorder ?Frequently drink alcohol ?Glaucoma ?Heart disease ?High blood pressure ?History of irregular heartbeat ?History of low levels of calcium, magnesium, or potassium in the blood ?Liver disease ?Receiving electroconvulsive therapy ?Seizures ?Suicidal thoughts, plans, or attempt; a previous suicide attempt by you or a family member ?Take medications that prevent or treat blood clots ?Thyroid disease ?An unusual or allergic reaction to sertraline, other medications, foods, dyes, or preservatives ?Pregnant or trying to get pregnant ?Breast-feeding ?How should I use this medication? ?Take this medication by mouth with a glass of water. Follow the directions on the prescription label. You can take it with or without food. Take your medication at regular intervals. Do not take your medication more often than directed. Do not stop taking this medication suddenly except upon the advice of your care team. Stopping this medication too quickly may cause serious side effects or your condition may worsen. ?A special MedGuide will be given to you by the pharmacist with each prescription and refill. Be sure to read this information carefully each time. ?Talk to your care team about the use of this medication in children. While this medication may  be prescribed for children as young as 7 years for selected conditions, precautions do apply. ?Overdosage: If you think you have taken too much of this medicine contact a poison control center or emergency room at once. ?NOTE: This medicine is only for you. Do not share this medicine with others. ?What if I miss a dose? ?If you miss a dose, take it as soon as you can. If it is almost time for your next dose, take only that dose. Do not take double or extra doses. ?What may interact with this medication? ?Do not take this medication with any of the following: ?Cisapride ?Dronedarone ?Linezolid ?MAOIs like Carbex, Eldepryl, Marplan, Nardil, and Parnate ?Methylene blue (injected into a vein) ?Pimozide ?Thioridazine ?This medication may also interact with the following: ?Alcohol ?Amphetamines ?Aspirin and aspirin-like medications ?Certain medications for depression, anxiety, or other mental health conditions ?Certain medications for fungal infections like ketoconazole, fluconazole, posaconazole, and itraconazole ?Certain medications for irregular heart beat like flecainide, quinidine, propafenone ?Certain medications for migraine headaches like almotriptan, eletriptan, frovatriptan, naratriptan, rizatriptan, sumatriptan, zolmitriptan ?Certain medications for sleep ?Certain medications for seizures like carbamazepine, valproic acid, phenytoin ?Certain medications that treat or prevent blood clots like warfarin, enoxaparin, dalteparin ?Cimetidine ?Digoxin ?Diuretics ?Fentanyl ?Isoniazid ?Lithium ?NSAIDs, medications for pain and inflammation, like ibuprofen or naproxen ?Other medications that prolong the QT interval (cause an abnormal heart rhythm) like dofetilide ?Rasagiline ?Safinamide ?Supplements like St. John's wort, kava kava, valerian ?Tolbutamide ?Tramadol ?Tryptophan ?This list may not describe all possible interactions. Give your health care provider a list of all the medicines, herbs, non-prescription drugs, or  dietary supplements you use. Also tell them if you smoke, drink alcohol, or use illegal drugs. Some items may interact with your medicine. ?What should  I watch for while using this medication? ?Tell your care team if your symptoms do not get better or if they get worse. Visit your care team for regular checks on your progress. Because it may take several weeks to see the full effects of this medication, it is important to continue your treatment as prescribed by your care team. ?Patients and their families should watch out for new or worsening thoughts of suicide or depression. Also watch out for sudden changes in feelings such as feeling anxious, agitated, panicky, irritable, hostile, aggressive, impulsive, severely restless, overly excited and hyperactive, or not being able to sleep. If this happens, especially at the beginning of treatment or after a change in dose, call your care team. ?This medication may affect your coordination, reaction time, or judgment. Do not drive or operate machinery until you know how this medication affects you. Sit or stand up slowly to reduce the risk of dizzy or fainting spells. Drinking alcohol with this medication can increase the risk of these side effects. ?Your mouth may get dry. Chewing sugarless gum or sucking hard candy, and drinking plenty of water may help. Contact your care team if the problem does not go away or is severe. ?What side effects may I notice from receiving this medication? ?Side effects that you should report to your care team as soon as possible: ?Allergic reactions--skin rash, itching, hives, swelling of the face, lips, tongue, or throat ?Bleeding--bloody or black, tar-like stools, red or dark brown urine, vomiting blood or brown material that looks like coffee grounds, small red or purple spots on skin, unusual bleeding or bruising ?Heart rhythm changes--fast or irregular heartbeat, dizziness, feeling faint or lightheaded, chest pain, trouble  breathing ?Low sodium level--muscle weakness, fatigue, dizziness, headache, confusion ?Serotonin syndrome--irritability, confusion, fast or irregular heartbeat, muscle stiffness, twitching muscles, sweating, high fever, seizure, chills, vomiting, diarrhea ?Sudden eye pain or change in vision such as blurred vision, seeing halos around lights, vision loss ?Thoughts of suicide or self-harm, worsening mood ?Side effects that usually do not require medical attention (report these to your care team if they continue or are bothersome): ?Change in sex drive or performance ?Diarrhea ?Excessive sweating ?Nausea ?Tremors or shaking ?Upset stomach ?This list may not describe all possible side effects. Call your doctor for medical advice about side effects. You may report side effects to FDA at 1-800-FDA-1088. ?Where should I keep my medication? ?Keep out of the reach of children and pets. ?Store at room temperature between 15 and 30 degrees C (59 and 86 degrees F). Get rid of any unused medication after the expiration date. ?To get rid of medications that are no longer needed or expired: ?Take the medication to a medication take-back program. Check with your pharmacy or law enforcement to find a location. ?If you cannot return the medication, check the label or package insert to see if the medication should be thrown out in the garbage or flushed down the toilet. If you are not sure, ask your care team. If it is safe to put in the trash, empty the medication out of the container. Mix the medication with cat litter, dirt, coffee grounds, or other unwanted substance. Seal the mixture in a bag or container. Put it in the trash. ?NOTE: This sheet is a summary. It may not cover all possible information. If you have questions about this medicine, talk to your doctor, pharmacist, or health care provider. ?? 2023 Elsevier/Gold Standard (2021-02-15 00:00:00) ? ?

## 2021-12-29 DIAGNOSIS — R450 Nervousness: Secondary | ICD-10-CM | POA: Diagnosis not present

## 2021-12-29 DIAGNOSIS — R52 Pain, unspecified: Secondary | ICD-10-CM | POA: Diagnosis not present

## 2021-12-29 DIAGNOSIS — E038 Other specified hypothyroidism: Secondary | ICD-10-CM | POA: Diagnosis not present

## 2021-12-30 LAB — CBC WITH DIFFERENTIAL/PLATELET
Absolute Monocytes: 1104 cells/uL — ABNORMAL HIGH (ref 200–950)
Basophils Absolute: 140 cells/uL (ref 0–200)
Basophils Relative: 2.3 %
Eosinophils Absolute: 250 cells/uL (ref 15–500)
Eosinophils Relative: 4.1 %
HCT: 37.9 % (ref 35.0–45.0)
Hemoglobin: 13.2 g/dL (ref 11.7–15.5)
Lymphs Abs: 3806 cells/uL (ref 850–3900)
MCH: 32.3 pg (ref 27.0–33.0)
MCHC: 34.8 g/dL (ref 32.0–36.0)
MCV: 92.7 fL (ref 80.0–100.0)
MPV: 10.9 fL (ref 7.5–12.5)
Monocytes Relative: 18.1 %
Neutro Abs: 799 cells/uL — ABNORMAL LOW (ref 1500–7800)
Neutrophils Relative %: 13.1 %
Platelets: 359 10*3/uL (ref 140–400)
RBC: 4.09 10*6/uL (ref 3.80–5.10)
RDW: 11.9 % (ref 11.0–15.0)
Total Lymphocyte: 62.4 %
WBC: 6.1 10*3/uL (ref 3.8–10.8)

## 2021-12-30 LAB — COMPLETE METABOLIC PANEL WITH GFR
AG Ratio: 1.1 (calc) (ref 1.0–2.5)
ALT: 49 U/L — ABNORMAL HIGH (ref 6–29)
AST: 51 U/L — ABNORMAL HIGH (ref 10–35)
Albumin: 3.9 g/dL (ref 3.6–5.1)
Alkaline phosphatase (APISO): 101 U/L (ref 37–153)
BUN: 12 mg/dL (ref 7–25)
CO2: 29 mmol/L (ref 20–32)
Calcium: 9.6 mg/dL (ref 8.6–10.4)
Chloride: 105 mmol/L (ref 98–110)
Creat: 0.84 mg/dL (ref 0.60–0.95)
Globulin: 3.5 g/dL (calc) (ref 1.9–3.7)
Glucose, Bld: 103 mg/dL — ABNORMAL HIGH (ref 65–99)
Potassium: 5.3 mmol/L (ref 3.5–5.3)
Sodium: 142 mmol/L (ref 135–146)
Total Bilirubin: 0.4 mg/dL (ref 0.2–1.2)
Total Protein: 7.4 g/dL (ref 6.1–8.1)
eGFR: 69 mL/min/{1.73_m2} (ref >=60)

## 2021-12-30 LAB — TSH: TSH: 7.63 mIU/L — ABNORMAL HIGH (ref 0.40–4.50)

## 2021-12-30 LAB — T4, FREE: Free T4: 0.9 ng/dL (ref 0.8–1.8)

## 2022-01-06 ENCOUNTER — Ambulatory Visit (INDEPENDENT_AMBULATORY_CARE_PROVIDER_SITE_OTHER): Payer: Medicare Other | Admitting: Sports Medicine

## 2022-01-06 ENCOUNTER — Ambulatory Visit (INDEPENDENT_AMBULATORY_CARE_PROVIDER_SITE_OTHER): Payer: Medicare Other

## 2022-01-06 DIAGNOSIS — M170 Bilateral primary osteoarthritis of knee: Secondary | ICD-10-CM | POA: Diagnosis not present

## 2022-01-06 DIAGNOSIS — M25561 Pain in right knee: Secondary | ICD-10-CM

## 2022-01-06 DIAGNOSIS — M25562 Pain in left knee: Secondary | ICD-10-CM | POA: Diagnosis not present

## 2022-01-06 MED ORDER — CELECOXIB 200 MG PO CAPS: ORAL_CAPSULE | ORAL | 2 refills | Status: DC

## 2022-01-06 NOTE — Progress Notes (Signed)
? ? ?  Procedures performed today:   ? ?None. ? ?Independent interpretation of notes and tests performed by another provider:  ? ?None. ? ?Brief History, Exam, Impression, and Recommendations:   ? ?Primary osteoarthritis of both knees ?Pleasant 81 year old female, known knee osteoarthritis, responded well to Celebrex back in 2019, now working in the garden and around the pool and having recurrence of pain medial joint line without swelling, minimal pain with twisting, on exam she is got tenderness at the medial joint line, we will restart Celebrex, home conditioning get updated x-rays. ?Return to see me in 4 weeks, injections if not better. ? ?Chronic process with exacerbation of pharmacologic interval ? ?___________________________________________ ?Gwen Her. Dianah Field, M.D., ABFM., CAQSM. ?Primary Care and Sports Medicine ?[place] ? ?Adjunct Instructor of Family Medicine  ?[employer] ?

## 2022-01-06 NOTE — Assessment & Plan Note (Signed)
Pleasant 81 year old female, known knee osteoarthritis, responded well to Celebrex back in 2019, now working in the garden and around the pool and having recurrence of pain medial joint line without swelling, minimal pain with twisting, on exam she is got tenderness at the medial joint line, we will restart Celebrex, home conditioning get updated x-rays. ?Return to see me in 4 weeks, injections if not better. ?

## 2022-01-15 ENCOUNTER — Emergency Department
Admission: EM | Admit: 2022-01-15 | Discharge: 2022-01-15 | Disposition: A | Discharge status: Home / Self Care | Payer: Medicare Other | Source: Home / Self Care

## 2022-01-15 ENCOUNTER — Other Ambulatory Visit: Payer: Self-pay

## 2022-01-15 DIAGNOSIS — H6123 Impacted cerumen, bilateral: Secondary | ICD-10-CM | POA: Diagnosis not present

## 2022-01-15 DIAGNOSIS — H6691 Otitis media, unspecified, right ear: Secondary | ICD-10-CM | POA: Diagnosis not present

## 2022-01-15 MED ORDER — AMOXICILLIN-POT CLAVULANATE 875-125 MG PO TABS: 1.0000 | ORAL_TABLET | Freq: Two times a day (BID) | ORAL | 0 refills | Status: AC

## 2022-01-15 NOTE — ED Triage Notes (Signed)
Pt here today c/o RT ear pain x 2 weeks. PCP last week, was told she had ear wax and to use debrox. Pain 9/10

## 2022-01-15 NOTE — ED Provider Notes (Signed)
[facility]    CSN: [id number] Arrival date & time: 01/15/22  0803      History   Chief Complaint Chief Complaint  Patient presents with   Otalgia    RT    HPI Mercedes Decker is a 81 y.o. female.   HPI Very pleasant 81 year old female presents with right ear pain for 2 weeks, reports current ear pain is 9 of 10.  Patient reports was evaluated by PCP last week and was advised she has earwax and told to get OTC Debrox to help remove earwax in right ear.  PMH significant for TIA, cardiac murmur, and pancreatic tumor.  Past Medical History:  Diagnosis Date   Arthritis    Cataract Both removed   Clotting disorder ([place])    Heart murmur    Hypertension    Migraine    Osteoporosis    Prediabetes 08/28/2018    Patient Active Problem List   Diagnosis Date Noted   Cataract 07/28/2021   Clotting disorder ([place]) 07/28/2021   DDD (degenerative disc disease) 02/09/2021   Hemorrhoids 02/09/2021   Irritable bladder 02/09/2021   RLS (restless legs syndrome) 02/09/2021   Contusion, back 05/25/2020   Cataract, nuclear sclerotic, left eye 04/12/2019   Neurogenic claudication due to lumbar spinal stenosis 03/04/2019   Prediabetes 08/28/2018   Fall 02/16/2018   Primary osteoarthritis of both knees 11/21/2017   H/O splenectomy 10/24/2017   Greater trochanteric bursitis of right hip 10/23/2017   Pancreatic tumor 05/08/2017   Dry eye syndrome of both lacrimal glands 04/24/2017   Pseudophakia of right eye 04/24/2017   Hoarseness 02/10/2017   Right shoulder injury 08/18/2016   Vitamin D deficiency 07/08/2016   Primary osteoarthritis of first carpometacarpal joint of right hand 05/01/2016   Right carpal tunnel syndrome 05/01/2016   TIA (transient ischemic attack) 03/08/2016   Right hand pain 03/07/2016   Patient has active power of attorney for health care 11/30/2015   Hypertension 12/25/2014   Murmur, cardiac 09/11/2012   Arthritis 09/03/2012   S/P hysterectomy  09/03/2012   Osteoporosis  L fem neck  Dexa 07/2014  -3.1, 03/2021 -3.6 09/03/2012   Migraine 09/03/2012   History of anemia 09/03/2012   Mixed hyperlipidemia 09/03/2012   GERD (gastroesophageal reflux disease) 09/03/2012   Benign essential hypertension 08/17/2011    Past Surgical History:  Procedure Laterality Date   ABDOMINAL HYSTERECTOMY     BACK SURGERY     BREAST SURGERY     BUNIONECTOMY     HERNIA REPAIR     INCISIONAL HERNIA REPAIR     KNEE ARTHROSCOPY     SPLENECTOMY, TOTAL  2019    OB History   No obstetric history on file.      Home Medications    Prior to Admission medications   Medication Sig Start Date End Date Taking? Authorizing Provider  amoxicillin-clavulanate (AUGMENTIN) 875-125 MG tablet Take 1 tablet by mouth 2 (two) times daily for 10 days. 01/15/22 01/25/22 Yes Eliezer Lofts, FNP  acetaminophen (TYLENOL) 500 MG tablet Take 500 mg by mouth every 8 (eight) hours as needed (for pain).    [provider]  amLODipine (NORVASC) 5 MG tablet TAKE 1 TABLET(5 MG) BY MOUTH DAILY. 11/02/21   Samuel Bouche, NP  Ascorbic Acid (VITAMIN C) 125 MG CHEW Chew 125 mg by mouth daily.    [provider]  aspirin EC 81 MG tablet Take 1 tablet (81 mg total) by mouth daily. Patient taking differently: Take 81 mg  by mouth daily. Takes 4 weekly 11/30/15   Marcial Pacas, DO  Calcium Carbonate (CALCIUM 600 PO) Take 600 mg by mouth daily.    [provider]  celecoxib (CELEBREX) 200 MG capsule One to 2 tablets by mouth daily as needed for pain. 01/06/22   Silverio Decamp, MD  Cholecalciferol (VITAMIN D3) 25 MCG (1000 UT) CAPS Take 1,000 Units by mouth daily.    [provider]  Cyanocobalamin (VITAMIN B 12 PO) Take 500 mg by mouth daily.    [provider]  ezetimibe (ZETIA) 10 MG tablet Take 1 tablet (10 mg total) by mouth daily. 08/05/21 11/03/21  Richardo Priest, MD  metoprolol succinate (TOPROL-XL) 50 MG 24 hr tablet Take 1 tablet (50 mg  total) by mouth daily. Take with or immediately following a meal. 09/07/21   Samuel Bouche, NP  OVER THE COUNTER MEDICATION Take 500 mg by mouth daily. [place] - Mushroom type vitamin for the brain.    [provider]  Turmeric Curcumin 500 MG CAPS Take 1,500 mg by mouth daily.    [provider]  vitamin E 180 MG (400 UNITS) capsule Take 400 Units by mouth daily.    [provider]    Family History Family History  Problem Relation Age of Onset   Alzheimer's disease Mother    Arthritis Mother    Heart disease Father    Hearing loss Father    Cancer Sister    Diabetes Sister    Heart disease Brother    Diabetes Brother    Cancer Brother    Arthritis Sister    Arthritis Daughter    Hearing loss Brother    Hearing loss Sister    Heart disease Brother    Stroke Sister     Social History Social History   Tobacco Use   Smoking status: Never   Smokeless tobacco: Never   Tobacco comments:    none  Vaping Use   Vaping Use: Never used  Substance Use Topics   Alcohol use: Yes    Comment: rarely   Drug use: No     Allergies   Tape, Cortisone, Meloxicam, Ace inhibitors, Atorvastatin, Gabapentin, Losartan, and Vicodin [hydrocodone-acetaminophen]   Review of Systems Review of Systems  HENT:  Positive for ear pain.   All other systems reviewed and are negative.   Physical Exam Triage Vital Signs ED Triage Vitals [01/15/22 0816]  Enc Vitals Group     BP (!) 154/75     Pulse Rate 72     Resp 18     Temp 98.1 F (36.7 C)     Temp Source Oral     SpO2 99 %     Weight      Height      Head Circumference      Peak Flow      Pain Score 9     Pain Loc      Pain Edu?      Excl. in [place]?    No data found.  Updated Vital Signs BP (!) 154/75 (BP Location: Right Arm)   Pulse 72   Temp 98.1 F (36.7 C) (Oral)   Resp 18   SpO2 99%       Physical Exam Vitals and nursing note reviewed.  Constitutional:      General: She is not in acute  distress.    Appearance: Normal appearance. She is normal weight.  HENT:     Right  Ear: External ear normal.     Left Ear: External ear normal.     Ears:     Comments: Bilateral EACs impacted with excessive cerumen unable to visualize either TM; post bilateral ear lavage: left EAC-clear, left TM-clear with good light reflex; right EAC-clear, right TM-erythematous, bulging    Mouth/Throat:     Mouth: Mucous membranes are moist.     Pharynx: Oropharynx is clear.  Eyes:     Extraocular Movements: Extraocular movements intact.     Conjunctiva/sclera: Conjunctivae normal.     Pupils: Pupils are equal, round, and reactive to light.  Cardiovascular:     Rate and Rhythm: Normal rate and regular rhythm.     Pulses: Normal pulses.     Heart sounds: Murmur heard.  Pulmonary:     Effort: Pulmonary effort is normal.     Breath sounds: Normal breath sounds. No wheezing, rhonchi or rales.  Musculoskeletal:     Cervical back: Normal range of motion and neck supple. No tenderness.  Lymphadenopathy:     Cervical: No cervical adenopathy.  Skin:    General: Skin is warm and dry.  Neurological:     General: No focal deficit present.     Mental Status: She is alert and oriented to person, place, and time.     UC Treatments / Results  Labs (all labs ordered are listed, but only abnormal results are displayed) Labs Reviewed - No data to display  EKG   Radiology No results found.  Procedures Procedures (including critical care time)  Medications Ordered in UC Medications - No data to display  Initial Impression / Assessment and Plan / UC Course  I have reviewed the triage vital signs and the nursing notes.  Pertinent labs & imaging results that were available during my care of the patient were reviewed by me and considered in my medical decision making (see chart for details).     MDM: 1.  Acute right otitis media-Rx'd Augmentin; 2.  Bilateral cerumen impaction-resolved with bilateral  ear lavage. Instructed patient to take medication as directed with food to completion.  Encouraged patient to increase daily water intake while taking this medication.  Advised patient if symptoms worsen and/or unresolved please follow-up with PCP or here for further evaluation.  Patient discharged home, hemodynamically stable. Final Clinical Impressions(s) / UC Diagnoses   Final diagnoses:  Acute right otitis media  Bilateral impacted cerumen     Discharge Instructions      Instructed patient to take medication as directed with food to completion.  Encouraged patient to increase daily water intake while taking this medication.  Advised patient if symptoms worsen and/or unresolved please follow-up with PCP or here for further evaluation.     ED Prescriptions     Medication Sig Dispense Auth. Provider   amoxicillin-clavulanate (AUGMENTIN) 875-125 MG tablet Take 1 tablet by mouth 2 (two) times daily for 10 days. 20 tablet Eliezer Lofts, FNP      PDMP not reviewed this encounter.   Eliezer Lofts, [place] 01/15/22 [phone]

## 2022-01-15 NOTE — Discharge Instructions (Addendum)
Instructed patient to take medication as directed with food to completion.  Encouraged patient to increase daily water intake while taking this medication.  Advised patient if symptoms worsen and/or unresolved please follow-up with PCP or here for further evaluation. 

## 2022-01-16 ENCOUNTER — Telehealth: Payer: Self-pay | Admitting: Family Medicine

## 2022-01-16 ENCOUNTER — Emergency Department
Admission: EM | Admit: 2022-01-16 | Discharge: 2022-01-16 | Discharge status: Against Medical Advice | Payer: Medicare Other | Source: Home / Self Care

## 2022-01-16 ENCOUNTER — Other Ambulatory Visit: Payer: Self-pay

## 2022-01-16 MED ORDER — FLUCONAZOLE 150 MG PO TABS: ORAL_TABLET | ORAL | 0 refills | Status: DC

## 2022-01-16 MED ORDER — AZITHROMYCIN 250 MG PO TABS: 250.0000 mg | ORAL_TABLET | Freq: Every day | ORAL | 0 refills | Status: DC

## 2022-01-16 NOTE — Telephone Encounter (Signed)
Patient reports Augmentin caused ear ringing and making her feel flush, request different antibiotic and medication for vaginal candidiasis.  Zithromax and Diflucan sent to pharmacy per patient request.

## 2022-01-25 ENCOUNTER — Other Ambulatory Visit: Payer: Self-pay | Admitting: Medical-Surgical

## 2022-01-25 DIAGNOSIS — I10 Essential (primary) hypertension: Secondary | ICD-10-CM

## 2022-02-03 ENCOUNTER — Ambulatory Visit (INDEPENDENT_AMBULATORY_CARE_PROVIDER_SITE_OTHER): Payer: Medicare Other | Admitting: Sports Medicine

## 2022-02-03 DIAGNOSIS — M170 Bilateral primary osteoarthritis of knee: Secondary | ICD-10-CM | POA: Diagnosis not present

## 2022-02-03 NOTE — Progress Notes (Signed)
    Procedures performed today:    None.  Independent interpretation of notes and tests performed by another provider:   None.  Brief History, Exam, Impression, and Recommendations:    Primary osteoarthritis of both knees Mercedes Decker returns, she is a very pleasant 81 year old female, avid gardener. Was having some pain medial joint line both knees when working around the garden around [employer] pool. We treat her conservatively, added Celebrex which she only took a couple of times, she restarted her home osteoarthritis conditioning exercises and tells me her symptoms have for the most part resolved, she can return to see me as needed.    ___________________________________________ Gwen Her. Dianah Field, M.D., ABFM., CAQSM. Primary Care and [place] Instructor of [place] [facility] of Medicine

## 2022-02-03 NOTE — Assessment & Plan Note (Signed)
Mercedes Decker returns, she is a very pleasant 81 year old female, avid gardener. Was having some pain medial joint line both knees when working around the garden around [employer] pool. We treat her conservatively, added Celebrex which she only took a couple of times, she restarted her home osteoarthritis conditioning exercises and tells me her symptoms have for the most part resolved, she can return to see me as needed.

## 2022-02-07 ENCOUNTER — Ambulatory Visit: Payer: Medicare Other | Admitting: Medical-Surgical

## 2022-02-15 DIAGNOSIS — M47816 Spondylosis without myelopathy or radiculopathy, lumbar region: Secondary | ICD-10-CM | POA: Diagnosis not present

## 2022-02-15 DIAGNOSIS — G894 Chronic pain syndrome: Secondary | ICD-10-CM | POA: Diagnosis not present

## 2022-02-15 DIAGNOSIS — M4726 Other spondylosis with radiculopathy, lumbar region: Secondary | ICD-10-CM | POA: Diagnosis not present

## 2022-02-15 DIAGNOSIS — M5136 Other intervertebral disc degeneration, lumbar region: Secondary | ICD-10-CM | POA: Diagnosis not present

## 2022-02-15 DIAGNOSIS — M48061 Spinal stenosis, lumbar region without neurogenic claudication: Secondary | ICD-10-CM | POA: Diagnosis not present

## 2022-02-23 ENCOUNTER — Other Ambulatory Visit: Payer: Self-pay

## 2022-02-23 DIAGNOSIS — I10 Essential (primary) hypertension: Secondary | ICD-10-CM

## 2022-02-23 MED ORDER — METOPROLOL SUCCINATE ER 50 MG PO TB24: 50.0000 mg | ORAL_TABLET | Freq: Every day | ORAL | 0 refills | Status: DC

## 2022-04-08 ENCOUNTER — Ambulatory Visit: Payer: Medicare Other | Admitting: Cardiology

## 2022-04-08 VITALS — BP 136/68 | Ht <= 58 in | Wt 105.0 lb

## 2022-04-08 DIAGNOSIS — I358 Other nonrheumatic aortic valve disorders: Secondary | ICD-10-CM | POA: Diagnosis not present

## 2022-04-08 DIAGNOSIS — R079 Chest pain, unspecified: Secondary | ICD-10-CM

## 2022-04-08 DIAGNOSIS — I10 Essential (primary) hypertension: Secondary | ICD-10-CM | POA: Diagnosis not present

## 2022-04-08 DIAGNOSIS — R011 Cardiac murmur, unspecified: Secondary | ICD-10-CM

## 2022-04-08 DIAGNOSIS — E782 Mixed hyperlipidemia: Secondary | ICD-10-CM | POA: Diagnosis not present

## 2022-04-08 NOTE — Patient Instructions (Signed)
Medication Instructions:  ?Your physician recommends that you continue on your current medications as directed. Please refer to the Current Medication list given to you today. ? ?*If you need a refill on your cardiac medications before your next appointment, please call your pharmacy* ? ? ?Lab Work: ?None ?If you have labs (blood work) drawn today and your tests are completely normal, you will receive your results only by: ?MyChart Message (if you have MyChart) OR ?A paper copy in the mail ?If you have any lab test that is abnormal or we need to change your treatment, we will call you to review the results. ? ? ?Testing/Procedures: ?None ? ? ?Follow-Up: ?At [facility], you and your health needs are our priority.  As part of our continuing mission to provide you with exceptional heart care, we have created designated Provider Care Teams.  These Care Teams include your primary Cardiologist (physician) and Advanced Practice Providers (APPs -  Physician Assistants and Nurse Practitioners) who all work together to provide you with the care you need, when you need it. ? ?We recommend signing up for the patient portal called "MyChart".  Sign up information is provided on this After Visit Summary.  MyChart is used to connect with patients for Virtual Visits (Telemedicine).  Patients are able to view lab/test results, encounter notes, upcoming appointments, etc.  Non-urgent messages can be sent to your provider as well.   ?To learn more about what you can do with MyChart, go to [email or web address].   ? ?Your next appointment:   ?Follow up as needed. ? ?The format for your next appointment:   ?In Person ? ?Provider:   ?Brian Munley, MD{ ? ? ?Other Instructions ?None ? ?Important Information About Sugar ? ? ? ? ? ? ?

## 2022-04-08 NOTE — Progress Notes (Signed)
Cardiology Office Note:    Date:  04/08/2022   ID:  Mercedes Decker, DOB 22-Feb-1941, MRN [id number]  PCP:  Samuel Bouche, NP  Cardiologist:  Shirlee More, MD    Referring MD: Samuel Bouche, NP    ASSESSMENT:    1. Chest pain, unspecified type   2. Primary hypertension   3. Mixed hyperlipidemia   4. Aortic valve sclerosis   5. Murmur, cardiac    PLAN:    In order of problems listed above:  What she is looking for is reassurance myocardial perfusion study was normal she has mild aortic regurgitation not uncommon in her age group with a sclerotic valve.  She does not require endocarditis prophylaxis asymptomatic and I think she is on a good medical regimen for her hypertension hyperlipidemia well controlled. Blood pressure at target she will continue her beta-blocker calcium channel blocker effective in controlling her hypertension She has upcoming labs in her PCP office she is statin intolerant well-tolerated   Next appointment: I will plan to see back in the office as needed.  I will not have office hours at [facility] as I enter slowdown that she said if she needed to she drive to [facility] and see me in the [place] office 30 minutes away.   Medication Adjustments/Labs and Tests Ordered: Current medicines are reviewed at length with the patient today.  Concerns regarding medicines are outlined above.  Orders Placed This Encounter  Procedures   EKG 12-Lead   No orders of the defined types were placed in this encounter.   Chief Complaint  Patient presents with   Follow-up   Chest Pain    History of Present Illness:    Mercedes Decker is a 81 y.o. female with a hx of hypertension hyperlipidemia with statin intolerance prediabetes chest pain with normal myocardial perfusion imaging and previous TIA last seen 2022.  She had a myocardial perfusion study performed 02/25/2021 showing normal left ventricular function EF 79% no ischemic ST changes and normal  myocardial perfusion, a low risk test.   Echocardiogram performed 01/13/2021 showed normal left ventricular size function EF 65% mild LVH normal right ventricular size function and  function and pulmonary artery pressure and no valvular abnormality  Compliance with diet, lifestyle and medications: Yes  She is looking for reassurance today she has a great deal of stress caring for her husband at home She has had no further chest pain she swims daily and is vigorous and active No edema shortness of breath palpitation or syncop her EKG shows sinus bradycardia 59 bpm and is otherwise normal She tolerates Zetia and says she has upcoming labs with her PCP Past Medical History:  Diagnosis Date   Arthritis    Cataract Both removed   Clotting disorder ([place])    Heart murmur    Hypertension    Migraine    Osteoporosis    Prediabetes 08/28/2018    Past Surgical History:  Procedure Laterality Date   ABDOMINAL HYSTERECTOMY     BACK SURGERY     BREAST SURGERY     BUNIONECTOMY     HERNIA REPAIR     INCISIONAL HERNIA REPAIR     KNEE ARTHROSCOPY     SPLENECTOMY, TOTAL  2019    Current Medications: No outpatient medications have been marked as taking for the 04/08/22 encounter (Office Visit) with Richardo Priest, MD.     Allergies:   Tape, Cortisone, Meloxicam, Ace inhibitors, Atorvastatin, Gabapentin, Losartan, and Vicodin [  hydrocodone-acetaminophen]   Social History   Socioeconomic History   Marital status: Married    Spouse name: Purcell Nails   Number of children: 1   Years of education: 12   Highest education level: 12th grade  Occupational History   Occupation: Firefighter    Comment: retired  Tobacco Use   Smoking status: Never   Smokeless tobacco: Never   Tobacco comments:    none  Vaping Use   Vaping Use: Never used  Substance and Sexual Activity   Alcohol use: Yes    Comment: rarely   Drug use: No   Sexual activity: Not Currently    Birth control/protection:  Post-menopausal  Other Topics Concern   Not on file  Social History Narrative   Retired. Lives with her husband and her dog. 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 Works around [employer]. Walks daily. No caffeine   Social Determinants of Health   Financial Resource Strain: Low Risk  (03/29/2021)   Overall Financial Resource Strain (CARDIA)    Difficulty of Paying Living Expenses: Not hard at all  Food Insecurity: No Food Insecurity (03/29/2021)   Hunger Vital Sign    Worried About Running Out of Food in the Last Year: Never true    Ran Out of Food in the Last Year: Never true  Transportation Needs: No Transportation Needs (03/29/2021)   PRAPARE - Hydrologist (Medical): No    Lack of Transportation (Non-Medical): No  Physical Activity: Sufficiently Active (03/29/2021)   Exercise Vital Sign    Days of Exercise per Week: 6 days    Minutes of Exercise per Session: 60 min  Stress: No Stress Concern Present (03/29/2021)   [place]    Feeling of Stress : Only a little  Social Connections: Moderately Integrated (03/29/2021)   Social Connection and Isolation Panel [NHANES]    Frequency of Communication with Friends and Family: More than three times a week    Frequency of Social Gatherings with Friends and Family: Three times a week    Attends Religious Services: More than 4 times per year    Active Member of Clubs or Organizations: No    Attends Archivist Meetings: Never    Marital Status: Married     Family History: The patient's family history includes Alzheimer's disease in her mother; Arthritis in her daughter, mother, and sister; Cancer in her brother and sister; Diabetes in her brother and sister; Hearing loss in her brother, father, and sister; Heart disease in her brother, brother, and father; Stroke in her sister. ROS:   Please see the history of present illness.    All other systems  reviewed and are negative.  EKGs/Labs/Other Studies Reviewed:    The following studies were reviewed today:  EKG:  EKG ordered today and personally reviewed.  The ekg ordered today demonstrates sinus rhythm and is normal  Recent Labs: 12/29/2021: ALT 49; BUN 12; Creat 0.84; Hemoglobin 13.2; Platelets 359; Potassium 5.3; Sodium 142; TSH 7.63  Recent Lipid Panel    Component Value Date/Time   CHOL 250 (H) 08/09/2021 0000   TRIG 121 08/09/2021 0000   HDL 69 08/09/2021 0000   CHOLHDL 3.6 08/09/2021 0000   VLDL 26 01/24/2017 0908   LDLCALC 157 (H) 08/09/2021 0000    Physical Exam:    VS:  BP 136/68 (BP Location: Right Arm, Patient Position: Sitting, Cuff Size: Normal)   Ht '4\' 10"'$  (1.473 m)  Wt 105 lb (47.6 kg)   BMI 21.95 kg/m     Wt Readings from Last 3 Encounters:  04/08/22 105 lb (47.6 kg)  12/28/21 107 lb 1.6 oz (48.6 kg)  08/09/21 108 lb (49 kg)     GEN: Appears in range well nourished, well developed in no acute distress HEENT: Normal NECK: No JVD; No carotid bruits LYMPHATICS: No lymphadenopathy CARDIAC: RRR, no murmurs, rubs, gallops RESPIRATORY:  Clear to auscultation without rales, wheezing or rhonchi  ABDOMEN: Soft, non-tender, non-distended MUSCULOSKELETAL:  No edema; No deformity  SKIN: Warm and dry NEUROLOGIC:  Alert and oriented x 3 PSYCHIATRIC:  Normal affect    Signed, Shirlee More, MD  04/08/2022 1:49 PM    [facility]

## 2022-04-14 ENCOUNTER — Other Ambulatory Visit: Payer: Self-pay | Admitting: Medical-Surgical

## 2022-04-14 DIAGNOSIS — I10 Essential (primary) hypertension: Secondary | ICD-10-CM

## 2022-04-14 NOTE — Telephone Encounter (Signed)
Patient needs appointment for further refills.  Last Office Visit 08/09/2021  Follow up canceled 02/07/2022 not rescheduled  Last filled 01/25/2022  Sent 30 day supply to the pharmacy. No Refill.

## 2022-04-14 NOTE — Telephone Encounter (Signed)
Patient has been scheduled for next available on 05/17/22 with PCP. AMUCK

## 2022-04-20 ENCOUNTER — Encounter: Payer: Self-pay | Admitting: General Practice

## 2022-04-23 DIAGNOSIS — R21 Rash and other nonspecific skin eruption: Secondary | ICD-10-CM | POA: Diagnosis not present

## 2022-04-26 DIAGNOSIS — L237 Allergic contact dermatitis due to plants, except food: Secondary | ICD-10-CM | POA: Diagnosis not present

## 2022-05-09 ENCOUNTER — Ambulatory Visit (INDEPENDENT_AMBULATORY_CARE_PROVIDER_SITE_OTHER): Payer: Medicare Other | Admitting: Medical-Surgical

## 2022-05-09 DIAGNOSIS — Z0000 Encounter for general adult medical examination without abnormal findings: Secondary | ICD-10-CM | POA: Diagnosis not present

## 2022-05-09 NOTE — Patient Instructions (Addendum)
[address] Maintenance Summary and Written Plan of Care  Mercedes Decker ,  Thank you for allowing me to perform your Medicare Annual Wellness Visit and for your ongoing commitment to your health.   Health Maintenance & Immunization History Health Maintenance  Topic Date Due  . COVID-19 Vaccine (5 - Pfizer risk series) 05/25/2022 (Originally 03/01/2021)  . Zoster Vaccines- Shingrix (1 of 2) 06/05/2022 (Originally 09/07/1959)  . INFLUENZA VACCINE  11/27/2022 (Originally 03/29/2022)  . TETANUS/TDAP  05/10/2023 (Originally 08/28/2020)  . Pneumonia Vaccine 42+ Years old  Completed  . DEXA SCAN  Completed  . HPV VACCINES  Aged Out   Immunization History  Administered Date(s) Administered  . Fluad Quad(high Dose 65+) 04/29/2019  . HIB (PRP-OMP) 07/04/2017  . Influenza Split 05/29/2010, 05/18/2011, 05/15/2012  . Influenza, High Dose Seasonal PF 04/26/2016, 05/06/2017, 07/04/2017, 05/03/2018  . Influenza,inj,Quad PF,6+ Mos 07/06/2020  . Influenza-Unspecified 05/29/2010, 05/18/2011, 05/15/2012, 05/29/2013, 05/28/2014, 05/01/2015, 06/29/2016, 05/29/2021  . Meningococcal Conjugate 11/07/2017, 01/02/2018  . PFIZER Comirnaty(Gray Top)Covid-19 Tri-Sucrose Vaccine 01/04/2021  . PFIZER(Purple Top)SARS-COV-2 Vaccination 09/19/2019, 10/10/2019, 06/02/2020  . Pneumococcal Conjugate-13 04/26/2016, 07/04/2017  . Pneumococcal Polysaccharide-23 03/30/2007, 11/07/2017  . Pneumococcal-Unspecified 03/30/2007  . Tdap 04/19/2008  . Zoster, Live 08/29/2008    These are the patient goals that we discussed:  Goals Addressed              This Visit's Progress   .  Patient Stated (pt-stated)        Continue to maintain her healthy and active lifestyle.        This is a list of Health Maintenance Items that are overdue or due now: Influenza vaccine Td vaccine Shingrix vaccine  Orders/Referrals Placed Today: No orders of the defined types were placed in this  encounter.  (Contact our referral department at [phone] if you have not spoken with someone about your referral appointment within the next 5 days)    Follow-up Plan Follow-up with Samuel Bouche, NP as planned Schedule TD vaccine and shingrix vaccine at the pharmacy. Medicare wellness visit in one year. Patient will access AVS on my chart.      Health Maintenance, Female Adopting a healthy lifestyle and getting preventive care are important in promoting health and wellness. Ask your health care provider about: The right schedule for you to have regular tests and exams. Things you can do on your own to prevent diseases and keep yourself healthy. What should I know about diet, weight, and exercise? Eat a healthy diet  Eat a diet that includes plenty of vegetables, fruits, low-fat dairy products, and lean protein. Do not eat a lot of foods that are high in solid fats, added sugars, or sodium. Maintain a healthy weight Body mass index (BMI) is used to identify weight problems. It estimates body fat based on height and weight. Your health care provider can help determine your BMI and help you achieve or maintain a healthy weight. Get regular exercise Get regular exercise. This is one of the most important things you can do for your health. Most adults should: Exercise for at least 150 minutes each week. The exercise should increase your heart rate and make you sweat (moderate-intensity exercise). Do strengthening exercises at least twice a week. This is in addition to the moderate-intensity exercise. Spend less time sitting. Even light physical activity can be beneficial. Watch cholesterol and blood lipids Have your blood tested for lipids and cholesterol at 82 years of age, then have this test every 5  years. Have your cholesterol levels checked more often if: Your lipid or cholesterol levels are high. You are older than 82 years of age. You are at high risk for heart disease. What  should I know about cancer screening? Depending on your health history and family history, you may need to have cancer screening at various ages. This may include screening for: Breast cancer. Cervical cancer. Colorectal cancer. Skin cancer. Lung cancer. What should I know about heart disease, diabetes, and high blood pressure? Blood pressure and heart disease High blood pressure causes heart disease and increases the risk of stroke. This is more likely to develop in people who have high blood pressure readings or are overweight. Have your blood pressure checked: Every 3-5 years if you are 22-71 years of age. Every year if you are 27 years old or older. Diabetes Have regular diabetes screenings. This checks your fasting blood sugar level. Have the screening done: Once every three years after age 5 if you are at a normal weight and have a low risk for diabetes. More often and at a younger age if you are overweight or have a high risk for diabetes. What should I know about preventing infection? Hepatitis B If you have a higher risk for hepatitis B, you should be screened for this virus. Talk with your health care provider to find out if you are at risk for hepatitis B infection. Hepatitis C Testing is recommended for: Everyone born from [birth year] through 1966. Anyone with known risk factors for hepatitis C. Sexually transmitted infections (STIs) Get screened for STIs, including gonorrhea and chlamydia, if: You are sexually active and are younger than 82 years of age. You are older than 82 years of age and your health care provider tells you that you are at risk for this type of infection. Your sexual activity has changed since you were last screened, and you are at increased risk for chlamydia or gonorrhea. Ask your health care provider if you are at risk. Ask your health care provider about whether you are at high risk for HIV. Your health care provider may recommend a prescription medicine  to help prevent HIV infection. If you choose to take medicine to prevent HIV, you should first get tested for HIV. You should then be tested every 3 months for as long as you are taking the medicine. Pregnancy If you are about to stop having your period (premenopausal) and you may become pregnant, seek counseling before you get pregnant. Take 400 to 800 micrograms (mcg) of folic acid every day if you become pregnant. Ask for birth control (contraception) if you want to prevent pregnancy. Osteoporosis and menopause Osteoporosis is a disease in which the bones lose minerals and strength with aging. This can result in bone fractures. If you are 1 years old or older, or if you are at risk for osteoporosis and fractures, ask your health care provider if you should: Be screened for bone loss. Take a calcium or vitamin D supplement to lower your risk of fractures. Be given hormone replacement therapy (HRT) to treat symptoms of menopause. Follow these instructions at home: Alcohol use Do not drink alcohol if: Your health care provider tells you not to drink. You are pregnant, may be pregnant, or are planning to become pregnant. If you drink alcohol: Limit how much you have to: 0-1 drink a day. Know how much alcohol is in your drink. In the [place] one drink equals one 12 oz bottle of beer (355 mL), one  5 oz glass of wine (148 mL), or one 1 oz glass of hard liquor (44 mL). Lifestyle Do not use any products that contain nicotine or tobacco. These products include cigarettes, chewing tobacco, and vaping devices, such as e-cigarettes. If you need help quitting, ask your health care provider. Do not use street drugs. Do not share needles. Ask your health care provider for help if you need support or information about quitting drugs. General instructions Schedule regular health, dental, and eye exams. Stay current with your vaccines. Tell your health care provider if: You often feel depressed. You  have ever been abused or do not feel safe at home. Summary Adopting a healthy lifestyle and getting preventive care are important in promoting health and wellness. Follow your health care provider's instructions about healthy diet, exercising, and getting tested or screened for diseases. Follow your health care provider's instructions on monitoring your cholesterol and blood pressure. This information is not intended to replace advice given to you by your health care provider. Make sure you discuss any questions you have with your health care provider. Document Revised: 01/04/2021 Document Reviewed: 01/04/2021 Elsevier Patient Education  [place].

## 2022-05-09 NOTE — Progress Notes (Signed)
MEDICARE ANNUAL WELLNESS VISIT  05/09/2022  Telephone Visit Disclaimer This Medicare AWV was conducted by telephone due to national recommendations for restrictions regarding the COVID-19 Pandemic (e.g. social distancing).  I verified, using two identifiers, that I am speaking with Mercedes Decker or their authorized healthcare agent. I discussed the limitations, risks, security, and privacy concerns of performing an evaluation and management service by telephone and the potential availability of an in-person appointment in the future. The patient expressed understanding and agreed to proceed.  Location of Patient: Home Location of Provider (nurse):  In the office.  Subjective:    Mercedes Decker is a 81 y.o. female patient of Samuel Bouche, NP who had a Medicare Annual Wellness Visit today via telephone. Micaella is Retired and lives with their spouse. she has 1 child. she reports that she is socially active and does interact with friends/family regularly. she is moderately physically active and enjoys gardening and enjoy building bird houses.  Patient Care Team: Samuel Bouche, NP as PCP - General (Nurse Practitioner) Berniece Salines, DO as PCP - Cardiology (Cardiology)     05/09/2022    8:10 AM 03/29/2021    9:06 AM 12/03/2018    8:10 AM 10/24/2018    7:17 AM 07/10/2018    8:07 AM  Advanced Directives  Does Patient Have a Medical Advance Directive? Yes Yes Yes Yes Yes  Type of Advance Directive Living will Living will;Healthcare Power of [place];Living will [place];Living will [place];Living will  Does patient want to make changes to medical advance directive? No - Patient declined No - Guardian declined No - Patient declined    Copy of [place] in Chart?  Yes - validated most recent copy scanned in chart (See row information) Yes - validated most recent copy scanned in chart (See row information) No -  copy requested No - copy requested    Hospital Utilization Over the Past 12 Months: # of hospitalizations or ER visits: 0 # of surgeries: 0  Review of Systems    Patient reports that her overall health is better compared to last year.  History obtained from chart review and the patient  Patient Reported Readings (BP, Pulse, CBG, Weight, etc) none  Pain Assessment Pain : No/denies pain     Current Medications & Allergies (verified) Allergies as of 05/09/2022       Reactions   Tape Other (See Comments)   BANDAIDS AND ADHESIVE TAPES TEAR SKIN (VERY THIN SKIN)  REQUESTING HYPOALLERGENIC TAPE.  "OUCHLESS TAPE." IS OK   Cortisone Other (See Comments)   Flushing and worsening joint pain   Meloxicam Other (See Comments)   Too sleepy   Ace Inhibitors Cough   Atorvastatin Other (See Comments)   Possible allergy   Gabapentin Other (See Comments)   Caused leg cramping   Losartan Other (See Comments)   Sweats, cramps   Vicodin [hydrocodone-acetaminophen] Other (See Comments)   Pt states that she stops breathing when she takes  Pain meds        Medication List        Accurate as of May 09, 2022  8:22 AM. If you have any questions, ask your nurse or doctor.          acetaminophen 500 MG tablet Commonly known as: TYLENOL Take 500 mg by mouth every 8 (eight) hours as needed (for pain).   amLODipine 5 MG tablet Commonly known as: NORVASC  TAKE 1 TABLET(5 MG) BY MOUTH DAILY. NEEDS APPOINTMENT FOR FURTHER REFILLS.   aspirin EC 81 MG tablet Take 1 tablet (81 mg total) by mouth daily. What changed: additional instructions   CALCIUM 600 PO Take 600 mg by mouth daily.   cyanocobalamin 100 MCG tablet Take 1 tablet by mouth daily.   ezetimibe 10 MG tablet Commonly known as: ZETIA Take 1 tablet (10 mg total) by mouth daily.   hydrOXYzine 25 MG tablet Commonly known as: ATARAX Take 25 mg by mouth 3 (three) times daily.   Magnesium 250 MG Tabs Take 1 tablet by  mouth every morning.   metoprolol succinate 50 MG 24 hr tablet Commonly known as: TOPROL-XL Take 1 tablet (50 mg total) by mouth daily. Take with or immediately following a meal.   OVER THE COUNTER MEDICATION Take 500 mg by mouth daily. [place] - Mushroom type vitamin for the brain.   Turmeric Curcumin 500 MG Caps Take 1,500 mg by mouth daily.   Vitamin C 125 MG Chew Chew 125 mg by mouth daily.   Vitamin D3 25 MCG (1000 UT) Caps Take 1,000 Units by mouth daily.   vitamin E 180 MG (400 UNITS) capsule Take 400 Units by mouth daily.        History (reviewed): Past Medical History:  Diagnosis Date   Arthritis    Cataract Both removed   Clotting disorder ([place])    Heart murmur    Hypertension    Migraine    Osteoporosis    Prediabetes 08/28/2018   Past Surgical History:  Procedure Laterality Date   ABDOMINAL HYSTERECTOMY     BACK SURGERY     BREAST SURGERY     BUNIONECTOMY     EYE SURGERY     HERNIA REPAIR     INCISIONAL HERNIA REPAIR     KNEE ARTHROSCOPY     SPLENECTOMY, TOTAL  2019   Family History  Problem Relation Age of Onset   Alzheimer's disease Mother    Arthritis Mother    Heart disease Father    Hearing loss Father    Cancer Sister    Diabetes Sister    Heart disease Brother    Diabetes Brother    Cancer Brother    Arthritis Sister    Diabetes Sister    Arthritis Daughter    Hearing loss Brother    Hearing loss Sister    Heart disease Brother    Stroke Sister    Social History   Socioeconomic History   Marital status: Married    Spouse name: Purcell Nails   Number of children: 1   Years of education: 12   Highest education level: 12th grade  Occupational History   Occupation: Firefighter    Comment: retired  Tobacco Use   Smoking status: Never   Smokeless tobacco: Never   Tobacco comments:    none  Vaping Use   Vaping Use: Never used  Substance and Sexual Activity   Alcohol use: Not Currently    Comment: rarely   Drug  use: No   Sexual activity: Not Currently    Birth control/protection: Post-menopausal  Other Topics Concern   Not on file  Social History Narrative   Retired. Lives with her husband and her dog. 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 Works around [employer]. Walks daily. She enjoys gardening and enjoy building bird houses.   Social Determinants of Health   Financial Resource Strain: Low Risk  (05/09/2022)   Overall Financial  Resource Strain (CARDIA)    Difficulty of Paying Living Expenses: Not hard at all  Food Insecurity: No Food Insecurity (05/09/2022)   Hunger Vital Sign    Worried About Running Out of Food in the Last Year: Never true    Ran Out of Food in the Last Year: Never true  Transportation Needs: No Transportation Needs (05/09/2022)   PRAPARE - Hydrologist (Medical): No    Lack of Transportation (Non-Medical): No  Physical Activity: Sufficiently Active (05/09/2022)   Exercise Vital Sign    Days of Exercise per Week: 7 days    Minutes of Exercise per Session: 50 min  Stress: No Stress Concern Present (03/29/2021)   [place]    Feeling of Stress : Only a little  Social Connections: Moderately Integrated (05/09/2022)   Social Connection and Isolation Panel [NHANES]    Frequency of Communication with Friends and Family: More than three times a week    Frequency of Social Gatherings with Friends and Family: Never    Attends Religious Services: More than 4 times per year    Active Member of [employer] or Organizations: No    Attends Archivist Meetings: Never    Marital Status: Married    Activities of Daily Living    05/09/2022    8:10 AM 05/04/2022   10:36 AM  In your present state of health, do you have any difficulty performing the following activities:  Hearing? 1 1  Comment bilateral hearing aids - doesn't use them all the time.   Vision?  0  Difficulty concentrating or  making decisions?  0  Walking or climbing stairs?  0  Dressing or bathing?  0  Doing errands, shopping?  0  Preparing Food and eating ?  N  Using the Toilet?  N  In the past six months, have you accidently leaked urine? Y Y  Comment sometimes only when she has urgency   Do you have problems with loss of bowel control?  N  Managing your Medications?  N  Managing your Finances?  N  Housekeeping or managing your Housekeeping?  N    Patient Education/ Literacy How often do you need to have someone help you when you read instructions, pamphlets, or other written materials from your doctor or pharmacy?: 1 - Never What is the last grade level you completed in school?: 12th grade  Exercise Current Exercise Habits: Home exercise routine, Type of exercise: walking;Other - see comments (swimming), Time (Minutes): 45, Frequency (Times/Week): 7, Weekly Exercise (Minutes/Week): 315, Intensity: Moderate, Exercise limited by: None identified  Diet Patient reports consuming 2 meals a day and 1 snack(s) a day Patient reports that her primary diet is: Regular, Low fat Patient reports that she does have regular access to food.   Depression Screen    05/09/2022    8:14 AM 12/28/2021   11:28 AM 08/09/2021    9:21 AM 03/29/2021    9:07 AM 03/05/2021    8:29 AM 02/04/2019   10:06 AM 12/03/2018    8:11 AM  PHQ 2/9 Scores  PHQ - 2 Score 0 0 0 0 0 0 0     Fall Risk    05/09/2022    8:14 AM 05/04/2022   10:36 AM 12/28/2021   11:28 AM 08/09/2021    9:21 AM 03/29/2021    9:07 AM  Fall Risk   Falls in the past year? 0 0 0  0 0  Number falls in past yr: 0 0 0 0 0  Injury with Fall? 0 0 0 0 0  Risk for fall due to : No Fall Risks  No Fall Risks    Follow up Falls evaluation completed  Falls evaluation completed Falls evaluation completed Falls evaluation completed     Objective:  MELINE RUSSAW seemed alert and oriented and she participated appropriately during our telephone visit.  Blood Pressure Weight  BMI  BP Readings from Last 3 Encounters:  04/08/22 136/68  01/15/22 (!) 154/75  12/28/21 (!) 166/77   Wt Readings from Last 3 Encounters:  04/08/22 105 lb (47.6 kg)  12/28/21 107 lb 1.6 oz (48.6 kg)  08/09/21 108 lb (49 kg)   BMI Readings from Last 1 Encounters:  04/08/22 21.95 kg/m    *Unable to obtain current vital signs, weight, and BMI due to telephone visit type  Hearing/Vision  [place] did not seem to have difficulty with hearing/understanding during the telephone conversation Reports that she has had a formal eye exam by an eye care professional within the past year Reports that she has not had a formal hearing evaluation within the past year *Unable to fully assess hearing and vision during telephone visit type  Cognitive Function:    05/09/2022    8:16 AM 03/29/2021    9:16 AM 12/03/2018    8:16 AM  6CIT Screen  What Year? 0 points 0 points 0 points  What month? 0 points 0 points 0 points  What time? 0 points 0 points 0 points  Count back from 20 0 points 0 points 0 points  Months in reverse 2 points 0 points 0 points  Repeat phrase 2 points 0 points 2 points  Total Score 4 points 0 points 2 points   (Normal:0-7, Significant for Dysfunction: >8)  Normal Cognitive Function Screening: Yes   Immunization & Health Maintenance Record Immunization History  Administered Date(s) Administered   Fluad Quad(high Dose 65+) 04/29/2019   HIB (PRP-OMP) 07/04/2017   Influenza Split 05/29/2010, 05/18/2011, 05/15/2012   Influenza, High Dose Seasonal PF 04/26/2016, 05/06/2017, 07/04/2017, 05/03/2018   Influenza,inj,Quad PF,6+ Mos 07/06/2020   Influenza-Unspecified 05/29/2010, 05/18/2011, 05/15/2012, 05/29/2013, 05/28/2014, 05/01/2015, 06/29/2016, 05/29/2021   Meningococcal Conjugate 11/07/2017, 01/02/2018   PFIZER Comirnaty(Gray Top)Covid-19 Tri-Sucrose Vaccine 01/04/2021   PFIZER(Purple Top)SARS-COV-2 Vaccination 09/19/2019, 10/10/2019, 06/02/2020   Pneumococcal Conjugate-13  04/26/2016, 07/04/2017   Pneumococcal Polysaccharide-23 03/30/2007, 11/07/2017   Pneumococcal-Unspecified 03/30/2007   Tdap 04/19/2008   Zoster, Live 08/29/2008    Health Maintenance  Topic Date Due   COVID-19 Vaccine (5 - Pfizer risk series) 05/25/2022 (Originally 03/01/2021)   Zoster Vaccines- Shingrix (1 of 2) 06/05/2022 (Originally 09/07/1959)   INFLUENZA VACCINE  11/27/2022 (Originally 03/29/2022)   TETANUS/TDAP  05/10/2023 (Originally 08/28/2020)   Pneumonia Vaccine 22+ Years old  Completed   DEXA SCAN  Completed   HPV VACCINES  Aged Out       Assessment  This is a routine wellness examination for [employer].  Health Maintenance: Due or Overdue There are no preventive care reminders to display for this patient.   Mercedes Decker does not need a referral for Community Assistance: Care Management:   no Social Work:    no Prescription Assistance:  no Nutrition/Diabetes Education:  no   Plan:  Personalized Goals  Goals Addressed               This Visit's Progress     Patient Stated (pt-stated)  Continue to maintain her healthy and active lifestyle.       Personalized Health Maintenance & Screening Recommendations  Influenza vaccine Td vaccine Shingrix vaccine  Lung Cancer Screening Recommended: no (Low Dose CT Chest recommended if Age 56-80 years, 30 pack-year currently smoking OR have quit w/in past 15 years) Hepatitis C Screening recommended: no HIV Screening recommended: no  Advanced Directives: Written information was not prepared per patient's request.  Referrals & Orders No orders of the defined types were placed in this encounter.   Follow-up Plan Follow-up with Samuel Bouche, NP as planned Schedule TD vaccine and shingrix vaccine at the pharmacy. Medicare wellness visit in one year. Patient will access AVS on my chart.   I have personally reviewed and noted the following in the patient's chart:   Medical and social history Use  of alcohol, tobacco or illicit drugs  Current medications and supplements Functional ability and status Nutritional status Physical activity Advanced directives List of other physicians Hospitalizations, surgeries, and ER visits in previous 12 months Vitals Screenings to include cognitive, depression, and falls Referrals and appointments  In addition, I have reviewed and discussed with Mercedes Decker certain preventive protocols, quality metrics, and best practice recommendations. A written personalized care plan for preventive services as well as general preventive health recommendations is available and can be mailed to the patient at her request.      Tinnie Gens, RN BSN  05/09/2022

## 2022-05-17 ENCOUNTER — Ambulatory Visit (INDEPENDENT_AMBULATORY_CARE_PROVIDER_SITE_OTHER): Payer: Medicare Other | Admitting: Medical-Surgical

## 2022-05-17 ENCOUNTER — Encounter: Payer: Self-pay | Admitting: Medical-Surgical

## 2022-05-17 VITALS — BP 130/59 | HR 58 | Resp 20 | Ht <= 58 in | Wt 104.0 lb

## 2022-05-17 DIAGNOSIS — I10 Essential (primary) hypertension: Secondary | ICD-10-CM | POA: Diagnosis not present

## 2022-05-17 DIAGNOSIS — Z636 Dependent relative needing care at home: Secondary | ICD-10-CM | POA: Diagnosis not present

## 2022-05-17 DIAGNOSIS — Z23 Encounter for immunization: Secondary | ICD-10-CM

## 2022-05-17 MED ORDER — AMLODIPINE BESYLATE 5 MG PO TABS: ORAL_TABLET | ORAL | 1 refills | Status: DC

## 2022-05-17 MED ORDER — METOPROLOL SUCCINATE ER 50 MG PO TB24: 50.0000 mg | ORAL_TABLET | Freq: Every day | ORAL | 1 refills | Status: DC

## 2022-05-17 NOTE — Progress Notes (Signed)
Established Patient Office Visit  Subjective   Patient ID: Mercedes Decker, female   DOB: 1940-09-03 Age: 81 y.o. MRN: [id number]   Chief Complaint  Patient presents with   Medication Refill   HPI Pleasant 81 year old female presenting today for the following:  Hypertension: Taking amlodipine 5 mg daily and Toprol XL 50 mg daily, tolerating both medications well without side effects.  Checking blood pressures occasionally at home with results similar to her appointment today.  Does does still add small amounts of salt to her food but rarely.  No intentional exercise but stays very active around her house and caring for her husband.  Denies CP, SOB, palpitations, lower extremity edema, dizziness, headaches, or vision changes.  Caregiver stress: Has been the sole caregiver for her husband who has dementia that seems to be advancing rapidly.  He has been followed by neurology and they have diagnosed him with Parkinson's along with dementia.  He has now started hallucinating both visual and auditory.  He has become very paranoid and does not want her out of his sight.  Tends to follow her to doctors appointments and around the house.  She notes that she is extremely stressed and at times is unable to even run errands to go to the store.  When he does sleep, she is scared to leave the house because she worries what will happen when he wakes up and she is not there.  Interested in any resources that there may be an community to help her with caregiver stress.  Not currently taking any medications for management of stress/anxiety/depression.  Interested in a counseling referral.   Objective:    Vitals:   05/17/22 1342  BP: (!) 130/59  Pulse: (!) 58  Resp: 20  Height: '4\' 10"'$  (1.473 m)  Weight: 104 lb (47.2 kg)  SpO2: 97%  BMI (Calculated): 21.74    Physical Exam Vitals and nursing note reviewed.  Constitutional:      General: She is not in acute distress.    Appearance: Normal appearance.  She is normal weight. She is not ill-appearing.  HENT:     Head: Normocephalic and atraumatic.  Cardiovascular:     Rate and Rhythm: Normal rate and regular rhythm.     Pulses: Normal pulses.     Heart sounds: Normal heart sounds.  Pulmonary:     Effort: Pulmonary effort is normal. No respiratory distress.     Breath sounds: Normal breath sounds. No wheezing, rhonchi or rales.  Skin:    General: Skin is warm and dry.  Neurological:     Mental Status: She is alert and oriented to person, place, and time.  Psychiatric:        Mood and Affect: Mood normal.        Behavior: Behavior normal.        Thought Content: Thought content normal.        Judgment: Judgment normal.   No results found for this or any previous visit (from the past 24 hour(s)).     The ASCVD Risk score (Arnett DK, et al., 2019) failed to calculate for the following reasons:   The 2019 ASCVD risk score is only valid for ages 85 to 80   Assessment & Plan:   1. Essential hypertension Blood pressure stable and at goal.  Continue amlodipine 5 mg daily and Toprol-XL 50 mg daily.  Monitor blood pressure at home with a goal of 130/80 or less.  If consistently higher than this,  return for further evaluation.  Recommend low-sodium diet and regular intentional activity. - amLODipine (NORVASC) 5 MG tablet; TAKE 1 TABLET(5 MG) BY MOUTH DAILY.  Dispense: 90 tablet; Refill: 1 - metoprolol succinate (TOPROL-XL) 50 MG 24 hr tablet; Take 1 tablet (50 mg total) by mouth daily. Take with or immediately following a meal.  Dispense: 90 tablet; Refill: 1  2. Need for immunization against influenza Flu vaccine given in office today. - Flu Vaccine QUAD High Dose(Fluad)  3. Caregiver stress Referring to behavioral health for counseling.  Unfortunately, her husband's condition will continue to deteriorate.  She absolutely would benefit from someone to help her even for short periods of time.  Referring to community care coordination to  get her connected with a Education officer, museum that can discuss community options that may be beneficial. - AMB Referral to [place] - Ambulatory referral to [place]  Return in about 6 months (around 11/15/2022) for chronic disease follow up. ___________________________________________ Clearnce Sorrel, DNP, APRN, FNP-BC Primary Care and [place]

## 2022-05-18 ENCOUNTER — Telehealth: Payer: Self-pay

## 2022-05-18 NOTE — Telephone Encounter (Signed)
   Telephone encounter was:  Unsuccessful.  05/18/2022 Name: KONNER WARRIOR MRN: [id number] DOB: [birth year]  Unsuccessful outbound call made today to assist with:  Caregiver Stress  Outreach Attempt:  1st Attempt  A HIPAA compliant voice message was left requesting a return call.  Instructed patient to call back    [place], [place] [facility]  [phone] [address], [place], [place] [address] Phone: [phone] Email: [email or web address]

## 2022-05-19 ENCOUNTER — Telehealth: Payer: Self-pay

## 2022-05-19 NOTE — Telephone Encounter (Signed)
   Telephone encounter was:  Successful.  05/19/2022 Name: Mercedes Decker MRN: [id number] DOB: 11-Mar-1941  Mercedes Decker is a 81 y.o. year old female who is a primary care patient of Samuel Bouche, NP . The community resource team was consulted for assistance with  caregiver stress  Care guide performed the following interventions: Patient provided with information about care guide support team and interviewed to confirm resource needs.Patient requested respite care resources to be mailed to her home  Follow Up Plan:  No further follow up planned at this time. The patient has been provided with needed resources.    [place], Care Management  [phone] [address], [place],  [address] Phone: [phone] Email: [email or web address]

## 2022-05-26 DIAGNOSIS — M48061 Spinal stenosis, lumbar region without neurogenic claudication: Secondary | ICD-10-CM | POA: Diagnosis not present

## 2022-05-26 DIAGNOSIS — M5136 Other intervertebral disc degeneration, lumbar region: Secondary | ICD-10-CM | POA: Diagnosis not present

## 2022-06-02 DIAGNOSIS — L814 Other melanin hyperpigmentation: Secondary | ICD-10-CM | POA: Diagnosis not present

## 2022-06-02 DIAGNOSIS — L821 Other seborrheic keratosis: Secondary | ICD-10-CM | POA: Diagnosis not present

## 2022-06-02 DIAGNOSIS — D225 Melanocytic nevi of trunk: Secondary | ICD-10-CM | POA: Diagnosis not present

## 2022-06-02 DIAGNOSIS — L300 Nummular dermatitis: Secondary | ICD-10-CM | POA: Diagnosis not present

## 2022-06-06 ENCOUNTER — Telehealth: Payer: Self-pay | Admitting: Cardiology

## 2022-06-06 NOTE — Telephone Encounter (Signed)
  Pt is calling to request to get medical records. They move to [facility] and got a new cardiologist there. She said if they can pick it up at the office or send her records at   [facility] cardiology [address], [place] [address]

## 2022-06-08 DIAGNOSIS — Z9081 Acquired absence of spleen: Secondary | ICD-10-CM | POA: Diagnosis not present

## 2022-06-08 DIAGNOSIS — R21 Rash and other nonspecific skin eruption: Secondary | ICD-10-CM | POA: Diagnosis not present

## 2022-06-08 DIAGNOSIS — I10 Essential (primary) hypertension: Secondary | ICD-10-CM | POA: Diagnosis not present

## 2022-06-08 DIAGNOSIS — E559 Vitamin D deficiency, unspecified: Secondary | ICD-10-CM | POA: Diagnosis not present

## 2022-06-08 DIAGNOSIS — M818 Other osteoporosis without current pathological fracture: Secondary | ICD-10-CM | POA: Diagnosis not present

## 2022-06-15 DIAGNOSIS — R21 Rash and other nonspecific skin eruption: Secondary | ICD-10-CM | POA: Diagnosis not present

## 2022-06-16 ENCOUNTER — Ambulatory Visit: Payer: Medicare Other | Admitting: Psychologist

## 2022-06-28 DIAGNOSIS — E785 Hyperlipidemia, unspecified: Secondary | ICD-10-CM | POA: Diagnosis not present

## 2022-06-28 DIAGNOSIS — I10 Essential (primary) hypertension: Secondary | ICD-10-CM | POA: Diagnosis not present

## 2022-07-08 DIAGNOSIS — M818 Other osteoporosis without current pathological fracture: Secondary | ICD-10-CM | POA: Diagnosis not present

## 2022-07-08 DIAGNOSIS — M810 Age-related osteoporosis without current pathological fracture: Secondary | ICD-10-CM | POA: Diagnosis not present

## 2022-07-12 DIAGNOSIS — G629 Polyneuropathy, unspecified: Secondary | ICD-10-CM | POA: Diagnosis not present

## 2022-07-13 DIAGNOSIS — R21 Rash and other nonspecific skin eruption: Secondary | ICD-10-CM | POA: Diagnosis not present

## 2022-07-27 DIAGNOSIS — Z9081 Acquired absence of spleen: Secondary | ICD-10-CM | POA: Diagnosis not present

## 2022-07-27 DIAGNOSIS — E559 Vitamin D deficiency, unspecified: Secondary | ICD-10-CM | POA: Diagnosis not present

## 2022-07-27 DIAGNOSIS — R21 Rash and other nonspecific skin eruption: Secondary | ICD-10-CM | POA: Diagnosis not present

## 2022-07-27 DIAGNOSIS — I10 Essential (primary) hypertension: Secondary | ICD-10-CM | POA: Diagnosis not present

## 2022-07-27 DIAGNOSIS — M818 Other osteoporosis without current pathological fracture: Secondary | ICD-10-CM | POA: Diagnosis not present

## 2022-08-15 DIAGNOSIS — H6122 Impacted cerumen, left ear: Secondary | ICD-10-CM | POA: Diagnosis not present

## 2022-08-16 DIAGNOSIS — L810 Postinflammatory hyperpigmentation: Secondary | ICD-10-CM | POA: Diagnosis not present

## 2022-09-08 DIAGNOSIS — M47816 Spondylosis without myelopathy or radiculopathy, lumbar region: Secondary | ICD-10-CM | POA: Diagnosis not present

## 2022-09-23 DIAGNOSIS — M47816 Spondylosis without myelopathy or radiculopathy, lumbar region: Secondary | ICD-10-CM | POA: Diagnosis not present

## 2022-11-07 ENCOUNTER — Other Ambulatory Visit: Payer: Self-pay | Admitting: Medical-Surgical

## 2022-11-07 DIAGNOSIS — I10 Essential (primary) hypertension: Secondary | ICD-10-CM

## 2022-11-16 ENCOUNTER — Ambulatory Visit (INDEPENDENT_AMBULATORY_CARE_PROVIDER_SITE_OTHER): Payer: Medicare Other | Admitting: Physician Assistant

## 2022-11-16 VITALS — BP 137/52 | HR 68 | Temp 98.1°F | Ht <= 58 in | Wt 103.0 lb

## 2022-11-16 DIAGNOSIS — J329 Chronic sinusitis, unspecified: Secondary | ICD-10-CM

## 2022-11-16 DIAGNOSIS — J40 Bronchitis, not specified as acute or chronic: Secondary | ICD-10-CM

## 2022-11-16 MED ORDER — AMOXICILLIN-POT CLAVULANATE 875-125 MG PO TABS: 1.0000 | ORAL_TABLET | Freq: Two times a day (BID) | ORAL | 0 refills | Status: DC

## 2022-11-16 MED ORDER — METHYLPREDNISOLONE SODIUM SUCC 125 MG IJ SOLR
125.0000 mg | Freq: Once | INTRAMUSCULAR | Status: AC
  Administered 2022-11-16: 125 mg via INTRAMUSCULAR

## 2022-11-16 NOTE — Patient Instructions (Signed)
  Acute Bronchitis, Adult  Acute bronchitis is when air tubes in the lungs (bronchi) suddenly get swollen. The condition can make it hard for you to breathe. In adults, acute bronchitis usually goes away within 2 weeks. A cough caused by bronchitis may last up to 3 weeks. Smoking, allergies, and asthma can make the condition worse. What are the causes? Germs that cause cold and flu (viruses). The most common cause of this condition is the virus that causes the common cold. Bacteria. Substances that bother (irritate) the lungs, including: Smoke from cigarettes and other types of tobacco. Dust and pollen. Fumes from chemicals, gases, or burned fuel. Indoor or outdoor air pollution. What increases the risk? A weak body's defense system. This is also called the immune system. Any condition that affects your lungs and breathing, such as asthma. What are the signs or symptoms? A cough. Coughing up clear, yellow, or green mucus. Making high-pitched whistling sounds when you breathe, most often when you breathe out (wheezing). Runny or stuffy nose. Having too much mucus in your lungs (chest congestion). Shortness of breath. Body aches. A sore throat. How is this treated? Acute bronchitis may go away over time without treatment. Your doctor may tell you to: Drink more fluids. This will help thin your mucus so it is easier to cough up. Use a device that gets medicine into your lungs (inhaler). Use a vaporizer or a humidifier. These are machines that add water to the air. This helps with coughing and poor breathing. Take a medicine that thins mucus and helps clear it from your lungs. Take a medicine that prevents or stops coughing. It is not common to take an antibiotic medicine for this condition. Follow these instructions at home:  Take over-the-counter and prescription medicines only as told by your doctor. Use an inhaler, vaporizer, or humidifier as told by your doctor. Take two  teaspoons (10 mL) of honey at bedtime. This helps lessen your coughing at night. Drink enough fluid to keep your pee (urine) pale yellow. Do not smoke or use any products that contain nicotine or tobacco. If you need help quitting, ask your doctor. Get a lot of rest. Return to your normal activities when your doctor says that it is safe. Keep all follow-up visits. How is this prevented?  Wash your hands often with soap and water for at least 20 seconds. If you cannot use soap and water, use hand sanitizer. Avoid contact with people who have cold symptoms. Try not to touch your mouth, nose, or eyes with your hands. Avoid breathing in smoke or chemical fumes. Make sure to get the flu shot every year. Contact a doctor if: Your symptoms do not get better in 2 weeks. You have trouble coughing up the mucus. Your cough keeps you awake at night. You have a fever. Get help right away if: You cough up blood. You have chest pain. You have very bad shortness of breath. You faint or keep feeling like you are going to faint. You have a very bad headache. Your fever or chills get worse. These symptoms may be an emergency. Get help right away. Call your local emergency services (911 in the [place] Do not wait to see if the symptoms will go away. Do not drive yourself to the hospital. Summary Acute bronchitis is when air tubes in the lungs (bronchi) suddenly get swollen. In adults, acute bronchitis usually goes away within 2 weeks. Drink more fluids. This will help thin your mucus so it   is easier to cough up. Take over-the-counter and prescription medicines only as told by your doctor. Contact a doctor if your symptoms do not improve after 2 weeks of treatment. This information is not intended to replace advice given to you by your health care provider. Make sure you discuss any questions you have with your health care provider. Document Revised: 12/16/2020 Document Reviewed: 12/16/2020 Elsevier  Patient Education  2023 Elsevier Inc.  

## 2022-11-16 NOTE — Progress Notes (Signed)
   Acute Office Visit  Subjective:     Patient ID: Mercedes Decker, female    DOB: 06-18-1941, 82 y.o.   MRN: [id number]  Chief Complaint  Patient presents with   Sore Throat    HPI Patient is in today for ST, sinus pressure, cough, sinus drainage for over a week. Pt has had minimal improvement with OTC medications. Pt does not smoke or have history of lung disease. She does feel some chest tightness and SOB. Pt denies any fever, chills, body aches.    .. Active Ambulatory Problems    Diagnosis Date Noted   Arthritis 09/03/2012   S/P hysterectomy 09/03/2012   Osteoporosis  L fem neck  Dexa 07/2014  -3.1, 03/2021 -3.6 09/03/2012   Migraine 09/03/2012   History of anemia 09/03/2012   Mixed hyperlipidemia 09/03/2012   GERD (gastroesophageal reflux disease) 09/03/2012   Murmur, cardiac 09/11/2012   Hypertension 12/25/2014   Patient has active power of attorney for health care 11/30/2015   Right hand pain 03/07/2016   TIA (transient ischemic attack) 03/08/2016   Right shoulder injury 08/18/2016   Hoarseness 02/10/2017   Pancreatic tumor 05/08/2017   Greater trochanteric bursitis of right hip 10/23/2017   Primary osteoarthritis of both knees 11/21/2017   Fall 02/16/2018   Prediabetes 08/28/2018   Neurogenic claudication due to lumbar spinal stenosis 03/04/2019   Contusion, back 05/25/2020   Benign essential hypertension 08/17/2011   Cataract, nuclear sclerotic, left eye 04/12/2019   DDD (degenerative disc disease) 02/09/2021   Dry eye syndrome of both lacrimal glands 04/24/2017   H/O splenectomy 10/24/2017   Hemorrhoids 02/09/2021   Irritable bladder 02/09/2021   Primary osteoarthritis of first carpometacarpal joint of right hand 05/01/2016   Pseudophakia of right eye 04/24/2017   Right carpal tunnel syndrome 05/01/2016   RLS (restless legs syndrome) 02/09/2021   Vitamin D deficiency 07/08/2016   Cataract 07/28/2021   Clotting disorder ([place]) 07/28/2021   Resolved  Ambulatory Problems    Diagnosis Date Noted   No Resolved Ambulatory Problems   Past Medical History:  Diagnosis Date   Heart murmur     ROS See HPI.      Objective:    BP (!) 137/52   Pulse 68   Temp 98.1 F (36.7 C) (Oral)   Ht 4\' 10"  (1.473 m)   Wt 103 lb (46.7 kg)   SpO2 98%   BMI 21.53 kg/m  BP Readings from Last 3 Encounters:  11/16/22 (!) 137/52  05/17/22 (!) 130/59  04/08/22 136/68   Wt Readings from Last 3 Encounters:  11/16/22 103 lb (46.7 kg)  05/17/22 104 lb (47.2 kg)  04/08/22 105 lb (47.6 kg)      Physical Exam       Assessment & Plan:  Marland KitchenMarland KitchenLynzi was seen today for sore throat.  Diagnoses and all orders for this visit:  Sinobronchitis -     amoxicillin-clavulanate (AUGMENTIN) 875-125 MG tablet; Take 1 tablet by mouth 2 (two) times daily. For 10 days. -     methylPREDNISolone sodium succinate (SOLU-MEDROL) 125 mg/2 mL injection 125 mg   URI symptoms for over a week Sent augmentin for 10 days with medrol dose pack Symptomatic care discussed Follow up as needed or if symptoms persist or worsen    Iran Planas, PA-C

## 2022-11-18 DIAGNOSIS — L570 Actinic keratosis: Secondary | ICD-10-CM | POA: Diagnosis not present

## 2022-11-18 DIAGNOSIS — L814 Other melanin hyperpigmentation: Secondary | ICD-10-CM | POA: Diagnosis not present

## 2022-11-18 DIAGNOSIS — D229 Melanocytic nevi, unspecified: Secondary | ICD-10-CM | POA: Diagnosis not present

## 2022-11-18 DIAGNOSIS — L821 Other seborrheic keratosis: Secondary | ICD-10-CM | POA: Diagnosis not present

## 2022-11-18 DIAGNOSIS — L820 Inflamed seborrheic keratosis: Secondary | ICD-10-CM | POA: Diagnosis not present

## 2022-11-22 ENCOUNTER — Encounter: Payer: Self-pay | Admitting: Physician Assistant

## 2022-12-06 ENCOUNTER — Other Ambulatory Visit: Payer: Self-pay

## 2022-12-06 DIAGNOSIS — I10 Essential (primary) hypertension: Secondary | ICD-10-CM

## 2022-12-06 MED ORDER — METOPROLOL SUCCINATE ER 50 MG PO TB24: 50.0000 mg | ORAL_TABLET | Freq: Every day | ORAL | 0 refills | Status: DC

## 2022-12-15 ENCOUNTER — Encounter: Payer: Self-pay | Admitting: Medical-Surgical

## 2022-12-16 DIAGNOSIS — M818 Other osteoporosis without current pathological fracture: Secondary | ICD-10-CM | POA: Diagnosis not present

## 2022-12-16 DIAGNOSIS — Z9081 Acquired absence of spleen: Secondary | ICD-10-CM | POA: Diagnosis not present

## 2022-12-16 DIAGNOSIS — E559 Vitamin D deficiency, unspecified: Secondary | ICD-10-CM | POA: Diagnosis not present

## 2022-12-21 ENCOUNTER — Encounter: Payer: Self-pay | Admitting: Medical-Surgical

## 2022-12-21 DIAGNOSIS — H52203 Unspecified astigmatism, bilateral: Secondary | ICD-10-CM | POA: Diagnosis not present

## 2022-12-21 DIAGNOSIS — H02831 Dermatochalasis of right upper eyelid: Secondary | ICD-10-CM | POA: Diagnosis not present

## 2022-12-21 DIAGNOSIS — Z961 Presence of intraocular lens: Secondary | ICD-10-CM | POA: Diagnosis not present

## 2022-12-21 DIAGNOSIS — H5201 Hypermetropia, right eye: Secondary | ICD-10-CM | POA: Diagnosis not present

## 2022-12-21 DIAGNOSIS — H524 Presbyopia: Secondary | ICD-10-CM | POA: Diagnosis not present

## 2022-12-21 DIAGNOSIS — H02834 Dermatochalasis of left upper eyelid: Secondary | ICD-10-CM | POA: Diagnosis not present

## 2022-12-21 DIAGNOSIS — H04123 Dry eye syndrome of bilateral lacrimal glands: Secondary | ICD-10-CM | POA: Diagnosis not present

## 2022-12-21 DIAGNOSIS — H43393 Other vitreous opacities, bilateral: Secondary | ICD-10-CM | POA: Diagnosis not present

## 2022-12-27 DIAGNOSIS — E782 Mixed hyperlipidemia: Secondary | ICD-10-CM | POA: Diagnosis not present

## 2022-12-28 DIAGNOSIS — E782 Mixed hyperlipidemia: Secondary | ICD-10-CM | POA: Diagnosis not present

## 2023-01-25 DIAGNOSIS — I10 Essential (primary) hypertension: Secondary | ICD-10-CM | POA: Diagnosis not present

## 2023-01-25 DIAGNOSIS — Z7983 Long term (current) use of bisphosphonates: Secondary | ICD-10-CM | POA: Diagnosis not present

## 2023-01-25 DIAGNOSIS — K869 Disease of pancreas, unspecified: Secondary | ICD-10-CM | POA: Diagnosis not present

## 2023-01-25 DIAGNOSIS — E559 Vitamin D deficiency, unspecified: Secondary | ICD-10-CM | POA: Diagnosis not present

## 2023-01-25 DIAGNOSIS — Z9081 Acquired absence of spleen: Secondary | ICD-10-CM | POA: Diagnosis not present

## 2023-01-25 DIAGNOSIS — Z1231 Encounter for screening mammogram for malignant neoplasm of breast: Secondary | ICD-10-CM | POA: Diagnosis not present

## 2023-01-25 DIAGNOSIS — M818 Other osteoporosis without current pathological fracture: Secondary | ICD-10-CM | POA: Diagnosis not present

## 2023-02-10 DIAGNOSIS — H6123 Impacted cerumen, bilateral: Secondary | ICD-10-CM | POA: Diagnosis not present

## 2023-02-15 ENCOUNTER — Other Ambulatory Visit: Payer: Self-pay | Admitting: Medical-Surgical

## 2023-02-15 DIAGNOSIS — I10 Essential (primary) hypertension: Secondary | ICD-10-CM

## 2023-02-20 DIAGNOSIS — R92323 Mammographic fibroglandular density, bilateral breasts: Secondary | ICD-10-CM | POA: Diagnosis not present

## 2023-02-20 DIAGNOSIS — Z1231 Encounter for screening mammogram for malignant neoplasm of breast: Secondary | ICD-10-CM | POA: Diagnosis not present

## 2023-03-03 ENCOUNTER — Other Ambulatory Visit: Payer: Self-pay | Admitting: Medical-Surgical

## 2023-03-03 DIAGNOSIS — I10 Essential (primary) hypertension: Secondary | ICD-10-CM

## 2023-03-06 DIAGNOSIS — Z23 Encounter for immunization: Secondary | ICD-10-CM | POA: Diagnosis not present

## 2023-03-06 DIAGNOSIS — M818 Other osteoporosis without current pathological fracture: Secondary | ICD-10-CM | POA: Diagnosis not present

## 2023-03-09 ENCOUNTER — Other Ambulatory Visit: Payer: Self-pay | Admitting: Sports Medicine

## 2023-03-09 ENCOUNTER — Ambulatory Visit (INDEPENDENT_AMBULATORY_CARE_PROVIDER_SITE_OTHER): Payer: Medicare Other

## 2023-03-09 ENCOUNTER — Ambulatory Visit (INDEPENDENT_AMBULATORY_CARE_PROVIDER_SITE_OTHER): Payer: Medicare Other | Admitting: Sports Medicine

## 2023-03-09 DIAGNOSIS — M48062 Spinal stenosis, lumbar region with neurogenic claudication: Secondary | ICD-10-CM

## 2023-03-09 DIAGNOSIS — I10 Essential (primary) hypertension: Secondary | ICD-10-CM

## 2023-03-09 DIAGNOSIS — M47816 Spondylosis without myelopathy or radiculopathy, lumbar region: Secondary | ICD-10-CM | POA: Diagnosis not present

## 2023-03-09 DIAGNOSIS — M5126 Other intervertebral disc displacement, lumbar region: Secondary | ICD-10-CM | POA: Diagnosis not present

## 2023-03-09 DIAGNOSIS — M533 Sacrococcygeal disorders, not elsewhere classified: Secondary | ICD-10-CM | POA: Diagnosis not present

## 2023-03-09 DIAGNOSIS — M48061 Spinal stenosis, lumbar region without neurogenic claudication: Secondary | ICD-10-CM | POA: Diagnosis not present

## 2023-03-09 MED ORDER — GABAPENTIN 100 MG PO CAPS: ORAL_CAPSULE | ORAL | 11 refills | Status: DC

## 2023-03-09 MED ORDER — METOPROLOL SUCCINATE ER 50 MG PO TB24
50.0000 mg | ORAL_TABLET | Freq: Every day | ORAL | 3 refills | Status: DC
Start: 2023-03-09 — End: 2024-03-20

## 2023-03-09 MED ORDER — PREDNISONE 50 MG PO TABS: ORAL_TABLET | ORAL | 0 refills | Status: DC

## 2023-03-09 NOTE — Progress Notes (Signed)
    Procedures performed today:    None.  Independent interpretation of notes and tests performed by another provider:   None.  Brief History, Exam, Impression, and Recommendations:    Neurogenic claudication due to lumbar spinal stenosis This is a very pleasant 82 year old female with multifactorial lumbar spondylosis, she has had several epidurals, some of which were effective, somewhat, she has been aggressively managed by Dr. Laurian Brim. At the last visit she had transforaminal epidurals that provided no relief, it was recommended that she get a nerve conduction and EMG but I do not see that she has had this done. More recently she was trying to lift up a heavy object, this occurred Monday, she felt severe pain right-sided low back without radicular symptoms. On exam she has tenderness midline lumbar spine as well as right sacroiliac joint, she does have good internal rotation and flexion of the hip suggesting the pain is not coming from her hip joint but more from her spine. I would like x-rays of her lumbar spine and SI joints to ensure were not missing a compression fracture, adding 5 days of steroids, low-dose Neurontin (there is a mention of leg cramps as an adverse effect to gabapentin however this is highly unlikely, it is more likely her leg cramps were a result of the underlying disease that was being treated by the gabapentin), SI joint physical therapy at home, return to see me in about 3 weeks.    ____________________________________________ Ihor Austin. Benjamin Stain, M.D., ABFM., CAQSM., AME. Primary Care and Sports Medicine [facility]  Adjunct Professor of Family Medicine  [employer]  Restaurant manager, fast food

## 2023-03-09 NOTE — Assessment & Plan Note (Signed)
This is a very pleasant 82 year old female with multifactorial lumbar spondylosis, she has had several epidurals, some of which were effective, somewhat, she has been aggressively managed by Dr. Laurian Brim. At the last visit she had transforaminal epidurals that provided no relief, it was recommended that she get a nerve conduction and EMG but I do not see that she has had this done. More recently she was trying to lift up a heavy object, this occurred Monday, she felt severe pain right-sided low back without radicular symptoms. On exam she has tenderness midline lumbar spine as well as right sacroiliac joint, she does have good internal rotation and flexion of the hip suggesting the pain is not coming from her hip joint but more from her spine. I would like x-rays of her lumbar spine and SI joints to ensure were not missing a compression fracture, adding 5 days of steroids, low-dose Neurontin (there is a mention of leg cramps as an adverse effect to gabapentin however this is highly unlikely, it is more likely her leg cramps were a result of the underlying disease that was being treated by the gabapentin), SI joint physical therapy at home, return to see me in about 3 weeks.

## 2023-03-28 ENCOUNTER — Other Ambulatory Visit: Payer: Self-pay | Admitting: Medical-Surgical

## 2023-03-28 DIAGNOSIS — I10 Essential (primary) hypertension: Secondary | ICD-10-CM

## 2023-03-30 ENCOUNTER — Ambulatory Visit (INDEPENDENT_AMBULATORY_CARE_PROVIDER_SITE_OTHER): Payer: Medicare Other | Admitting: Sports Medicine

## 2023-03-30 ENCOUNTER — Other Ambulatory Visit (INDEPENDENT_AMBULATORY_CARE_PROVIDER_SITE_OTHER): Payer: Medicare Other

## 2023-03-30 DIAGNOSIS — M461 Sacroiliitis, not elsewhere classified: Secondary | ICD-10-CM | POA: Diagnosis not present

## 2023-03-30 DIAGNOSIS — M48062 Spinal stenosis, lumbar region with neurogenic claudication: Secondary | ICD-10-CM

## 2023-03-30 MED ORDER — TRIAMCINOLONE ACETONIDE 40 MG/ML IJ SUSP
40.0000 mg | Freq: Once | INTRAMUSCULAR | Status: AC
Start: 2023-03-30 — End: 2023-03-30
  Administered 2023-03-30: 40 mg via INTRAMUSCULAR

## 2023-03-30 NOTE — Addendum Note (Signed)
Addended by: Carren Rang A on: 03/30/2023 11:32 AM   Modules accepted: Orders

## 2023-03-30 NOTE — Progress Notes (Signed)
    Procedures performed today:    Procedure: Real-time Ultrasound Guided injection of the right sacroiliac joint Device: Samsung HS60  Verbal informed consent obtained.  Time-out conducted.  Noted no overlying erythema, induration, or other signs of local infection.  Skin prepped in a sterile fashion.  Local anesthesia: Topical Ethyl chloride.  With sterile technique and under real time ultrasound guidance: Noted appearing joint, 1 cc Kenalog 40, 2 cc lidocaine, 2 cc bupivacaine injected easily. Patient had immediate and complete relief of her pain suggesting the right sacroiliac joint was the pain generator. Completed without difficulty  Advised to call if fevers/chills, erythema, induration, drainage, or persistent bleeding.  Images permanently stored and available for review in PACS.  Impression: Technically successful ultrasound guided injection.  Independent interpretation of notes and tests performed by another provider:   None.  Brief History, Exam, Impression, and Recommendations:    Neurogenic claudication due to lumbar spinal stenosis This is a pleasant 82 year old female, she has multifactorial low back pain, historically managed by Dr. Laurian Brim quite aggressively, she has had some epidurals some of which have been effective, it was recommended she get a nerve conduction/EMG but has not yet had this done. More recently she was tried to lift a heavy object, got severe right-sided low back pain without radicular symptoms, tender 9 midline lumbar spine as well as right SI joint pain at the time, got some x-rays that showed multilevel lumbar spondylitic processes, SI joints looked okay, we added conservative treatment in the form of 5 days of prednisone, low-dose Neurontin, home PT. Unfortunately she has only had meager improvement. She still has severe pain directly over the right sacroiliac joint so we injected this today for diagnostic and therapeutic purposes, she does have an  appointment coming up with Dr. Laurian Brim in a couple of weeks and I do think she should keep this appointment.    ____________________________________________ Ihor Austin. Benjamin Stain, M.D., ABFM., CAQSM., AME. Primary Care and Sports Medicine [facility]  Adjunct Professor of Family Medicine  [employer]

## 2023-03-30 NOTE — Assessment & Plan Note (Signed)
This is a pleasant 82 year old female, she has multifactorial low back pain, historically managed by Dr. Laurian Brim quite aggressively, she has had some epidurals some of which have been effective, it was recommended she get a nerve conduction/EMG but has not yet had this done. More recently she was tried to lift a heavy object, got severe right-sided low back pain without radicular symptoms, tender 9 midline lumbar spine as well as right SI joint pain at the time, got some x-rays that showed multilevel lumbar spondylitic processes, SI joints looked okay, we added conservative treatment in the form of 5 days of prednisone, low-dose Neurontin, home PT. Unfortunately she has only had meager improvement. She still has severe pain directly over the right sacroiliac joint so we injected this today for diagnostic and therapeutic purposes, she does have an appointment coming up with Dr. Laurian Brim in a couple of weeks and I do think she should keep this appointment.

## 2023-04-11 DIAGNOSIS — M5136 Other intervertebral disc degeneration, lumbar region: Secondary | ICD-10-CM | POA: Diagnosis not present

## 2023-04-28 ENCOUNTER — Emergency Department (HOSPITAL_BASED_OUTPATIENT_CLINIC_OR_DEPARTMENT_OTHER)
Admission: EM | Admit: 2023-04-28 | Discharge: 2023-04-29 | Disposition: A | Discharge status: Home / Self Care | Payer: Medicare Other | Attending: Emergency Medicine | Admitting: Emergency Medicine

## 2023-04-28 ENCOUNTER — Encounter (HOSPITAL_BASED_OUTPATIENT_CLINIC_OR_DEPARTMENT_OTHER): Payer: Self-pay

## 2023-04-28 DIAGNOSIS — Z7982 Long term (current) use of aspirin: Secondary | ICD-10-CM | POA: Insufficient documentation

## 2023-04-28 DIAGNOSIS — I700 Atherosclerosis of aorta: Secondary | ICD-10-CM | POA: Diagnosis not present

## 2023-04-28 DIAGNOSIS — Z7901 Long term (current) use of anticoagulants: Secondary | ICD-10-CM | POA: Insufficient documentation

## 2023-04-28 DIAGNOSIS — R221 Localized swelling, mass and lump, neck: Secondary | ICD-10-CM | POA: Diagnosis present

## 2023-04-28 DIAGNOSIS — I82890 Acute embolism and thrombosis of other specified veins: Secondary | ICD-10-CM | POA: Diagnosis not present

## 2023-04-28 DIAGNOSIS — Z79899 Other long term (current) drug therapy: Secondary | ICD-10-CM | POA: Diagnosis not present

## 2023-04-28 DIAGNOSIS — I10 Essential (primary) hypertension: Secondary | ICD-10-CM | POA: Diagnosis not present

## 2023-04-28 DIAGNOSIS — I82C12 Acute embolism and thrombosis of left internal jugular vein: Secondary | ICD-10-CM | POA: Diagnosis not present

## 2023-04-28 LAB — CBC WITH DIFFERENTIAL/PLATELET
Abs Immature Granulocytes: 0.02 10*3/uL (ref 0.00–0.07)
Basophils Absolute: 0.1 10*3/uL (ref 0.0–0.1)
Basophils Relative: 1 %
Eosinophils Absolute: 0.1 10*3/uL (ref 0.0–0.5)
Eosinophils Relative: 1 %
HCT: 39 % (ref 36.0–46.0)
Hemoglobin: 13.2 g/dL (ref 12.0–15.0)
Immature Granulocytes: 0 %
Lymphocytes Relative: 36 %
Lymphs Abs: 3 10*3/uL (ref 0.7–4.0)
MCH: 34.4 pg — ABNORMAL HIGH (ref 26.0–34.0)
MCHC: 33.8 g/dL (ref 30.0–36.0)
MCV: 101.6 fL — ABNORMAL HIGH (ref 80.0–100.0)
Monocytes Absolute: 0.8 10*3/uL (ref 0.1–1.0)
Monocytes Relative: 9 %
Neutro Abs: 4.4 10*3/uL (ref 1.7–7.7)
Neutrophils Relative %: 53 %
Platelets: 231 10*3/uL (ref 150–400)
RBC: 3.84 MIL/uL — ABNORMAL LOW (ref 3.87–5.11)
RDW: 15 % (ref 11.5–15.5)
WBC: 8.3 10*3/uL (ref 4.0–10.5)
nRBC: 0 % (ref 0.0–0.2)

## 2023-04-28 NOTE — ED Triage Notes (Signed)
Pt states that she had her teeth cleaned yesterday and woke up today with swelling on the left side of her neck. Pt reports that area is sore to palpation

## 2023-04-28 NOTE — ED Notes (Signed)
Provider at bedside

## 2023-04-28 NOTE — ED Provider Notes (Signed)
[place] EMERGENCY DEPARTMENT AT [facility]  Provider Note  CSN: [id number] Arrival date & time: 04/28/23 2052  History Chief Complaint  Patient presents with   Neck Pain    Mercedes Decker is a 82 y.o. female with history of HTN reports she had a routine teeth cleaning yesterday. Reports the were 'poking' at her teeth on the left lower jaw more than anywhere else but otherwise no issues. She reports she woke up today with pain and swelling in her L neck. Improved with an ice pack, but returned later. No fevers. No cough or trouble breathing.    Home Medications Prior to Admission medications   Medication Sig Start Date End Date Taking? Authorizing Provider  acetaminophen (TYLENOL) 500 MG tablet Take 500 mg by mouth every 8 (eight) hours as needed (for pain).    [provider]  amLODipine (NORVASC) 5 MG tablet TAKE 1 TABLET(5 MG) BY MOUTH DAILY 02/15/23   [name], NP  amoxicillin-clavulanate (AUGMENTIN) 875-125 MG tablet Take 1 tablet by mouth 2 (two) times daily. For 10 days. 11/16/22   Jomarie Longs, PA-C  Ascorbic Acid (VITAMIN C) 125 MG CHEW Chew 125 mg by mouth daily.    [provider]  aspirin EC 81 MG tablet Take 1 tablet (81 mg total) by mouth daily. Patient taking differently: Take 81 mg by mouth daily. Takes 4 weekly 11/30/15   Laren Boom, DO  Calcium Carbonate (CALCIUM 600 PO) Take 600 mg by mouth daily.    [provider]  Cholecalciferol (VITAMIN D3) 25 MCG (1000 UT) CAPS Take 1,000 Units by mouth daily.    [provider]  cyanocobalamin 100 MCG tablet Take 1 tablet by mouth daily.    [provider]  ezetimibe (ZETIA) 10 MG tablet Take 1 tablet (10 mg total) by mouth daily. 08/05/21 11/16/22  Baldo Daub, MD  gabapentin (NEURONTIN) 100 MG capsule One tab PO qHS for a week, then BID for a week, then TID. 03/09/23   Monica Becton, MD  Magnesium 250 MG TABS Take 1 tablet by mouth every morning.     [provider]  metoprolol succinate (TOPROL-XL) 50 MG 24 hr tablet Take 1 tablet (50 mg total) by mouth daily. Take with or immediately following a meal. 03/09/23   Monica Becton, MD  OVER THE COUNTER MEDICATION Take 500 mg by mouth daily. [place] - Mushroom type vitamin for the brain.    [provider]  predniSONE (DELTASONE) 50 MG tablet One tab PO daily for 5 days. 03/09/23   Monica Becton, MD  Turmeric Curcumin 500 MG CAPS Take 1,500 mg by mouth daily.    [provider]  vitamin E 180 MG (400 UNITS) capsule Take 400 Units by mouth daily.    [provider]     Allergies    Tape, Wound dressing adhesive, Cortisone, Meloxicam, Ace inhibitors, Atorvastatin, Gabapentin, Losartan, and Vicodin [hydrocodone-acetaminophen]   Review of Systems   Review of Systems Please see HPI for pertinent positives and negatives  Physical Exam BP (!) 153/64   Pulse 65   Temp 98.6 F (37 C) (Oral)   Resp 18   Ht 4\' 10"  (1.473 m)   Wt 44 kg   SpO2 100%   BMI 20.27 kg/m   Physical Exam Vitals and nursing note reviewed.  Constitutional:      Appearance: Normal appearance.  HENT:     Head: Normocephalic and atraumatic.  Nose: Nose normal.     Mouth/Throat:     Mouth: Mucous membranes are moist.  Eyes:     Extraocular Movements: Extraocular movements intact.     Conjunctiva/sclera: Conjunctivae normal.  Neck:     Comments: Tenderness and hard nodules in L neck along the track of the external jugular vein Cardiovascular:     Rate and Rhythm: Normal rate.  Pulmonary:     Effort: Pulmonary effort is normal.     Breath sounds: Normal breath sounds.  Abdominal:     General: Abdomen is flat.     Palpations: Abdomen is soft.     Tenderness: There is no abdominal tenderness.  Musculoskeletal:        General: No swelling. Normal range of motion.     Cervical back: Neck supple.  Skin:    General: Skin is warm and dry.  Neurological:      General: No focal deficit present.     Mental Status: She is alert.  Psychiatric:        Mood and Affect: Mood normal.     ED Results / Procedures / Treatments   EKG None  Procedures Procedures  Medications Ordered in the ED Medications - No data to display  Initial Impression and Plan  Patient here with exam concerning for left EJ thrombus, consider septic thrombus in setting of recent dental work. Will check labs, including inflammatory markers and cultures. Spoke with Radiologist regarding imaging test, given no [place] available here, and he recommends a CT venogram of chest extending up into the neck. CT tech aware.   ED Course       MDM Rules/Calculators/A&P Medical Decision Making Amount and/or Complexity of Data Reviewed Labs: ordered. Radiology: ordered.     Final Clinical Impression(s) / ED Diagnoses Final diagnoses:  None    Rx / DC Orders ED Discharge Orders     None

## 2023-04-29 ENCOUNTER — Emergency Department (HOSPITAL_BASED_OUTPATIENT_CLINIC_OR_DEPARTMENT_OTHER): Payer: Medicare Other

## 2023-04-29 DIAGNOSIS — I700 Atherosclerosis of aorta: Secondary | ICD-10-CM | POA: Diagnosis not present

## 2023-04-29 LAB — BASIC METABOLIC PANEL
Anion gap: 10 (ref 5–15)
BUN: 21 mg/dL (ref 8–23)
CO2: 25 mmol/L (ref 22–32)
Calcium: 9.2 mg/dL (ref 8.9–10.3)
Chloride: 104 mmol/L (ref 98–111)
Creatinine, Ser: 1.07 mg/dL — ABNORMAL HIGH (ref 0.44–1.00)
GFR, Estimated: 52 mL/min — ABNORMAL LOW (ref >60)
Glucose, Bld: 121 mg/dL — ABNORMAL HIGH (ref 70–99)
Potassium: 4.1 mmol/L (ref 3.5–5.1)
Sodium: 139 mmol/L (ref 135–145)

## 2023-04-29 LAB — SEDIMENTATION RATE: Sed Rate: 17 mm/h (ref 0–22)

## 2023-04-29 LAB — C-REACTIVE PROTEIN: CRP: 0.7 mg/dL (ref <1.0)

## 2023-04-29 MED ORDER — APIXABAN 2.5 MG PO TABS
10.0000 mg | ORAL_TABLET | Freq: Once | ORAL | Status: AC
  Administered 2023-04-29: 10 mg via ORAL
  Filled 2023-04-29: qty 4

## 2023-04-29 MED ORDER — APIXABAN (ELIQUIS) VTE STARTER PACK (10MG AND 5MG): ORAL_TABLET | ORAL | 0 refills | Status: DC

## 2023-04-29 MED ORDER — IOHEXOL 350 MG/ML SOLN
100.0000 mL | Freq: Once | INTRAVENOUS | Status: AC | PRN
  Administered 2023-04-29: 100 mL via INTRAVENOUS

## 2023-04-29 NOTE — ED Notes (Signed)
Pt transported to CT ?

## 2023-04-29 NOTE — Progress Notes (Signed)
Called patient to make sure she was able to fill eliquis at pharmacy. Patient states that she has not been able to pick up medication just yet but that the pharmacy is processing the prescription for her at this time.  Provided education on eliquis and recommended that she pick up as soon as she is able to. Patient understood.  Also, provided patient with co-pay and free 30day supply cards via email.  Delmar Landau, PharmD, BCPS 04/29/2023 12:04 PM ED Clinical Pharmacist -  [phone]

## 2023-04-30 ENCOUNTER — Emergency Department (HOSPITAL_COMMUNITY)
Admission: EM | Admit: 2023-04-30 | Discharge: 2023-04-30 | Disposition: A | Discharge status: Home / Self Care | Payer: Medicare Other | Attending: Emergency Medicine | Admitting: Emergency Medicine

## 2023-04-30 ENCOUNTER — Emergency Department (HOSPITAL_COMMUNITY): Payer: Medicare Other

## 2023-04-30 ENCOUNTER — Encounter (HOSPITAL_COMMUNITY): Payer: Self-pay

## 2023-04-30 ENCOUNTER — Other Ambulatory Visit: Payer: Self-pay

## 2023-04-30 DIAGNOSIS — R5383 Other fatigue: Secondary | ICD-10-CM | POA: Diagnosis not present

## 2023-04-30 DIAGNOSIS — M791 Myalgia, unspecified site: Secondary | ICD-10-CM | POA: Diagnosis not present

## 2023-04-30 DIAGNOSIS — R0789 Other chest pain: Secondary | ICD-10-CM | POA: Insufficient documentation

## 2023-04-30 DIAGNOSIS — I517 Cardiomegaly: Secondary | ICD-10-CM | POA: Diagnosis not present

## 2023-04-30 DIAGNOSIS — R519 Headache, unspecified: Secondary | ICD-10-CM | POA: Insufficient documentation

## 2023-04-30 DIAGNOSIS — Z7982 Long term (current) use of aspirin: Secondary | ICD-10-CM | POA: Insufficient documentation

## 2023-04-30 DIAGNOSIS — M542 Cervicalgia: Secondary | ICD-10-CM | POA: Insufficient documentation

## 2023-04-30 DIAGNOSIS — Z20822 Contact with and (suspected) exposure to covid-19: Secondary | ICD-10-CM | POA: Insufficient documentation

## 2023-04-30 DIAGNOSIS — Z7901 Long term (current) use of anticoagulants: Secondary | ICD-10-CM | POA: Diagnosis not present

## 2023-04-30 DIAGNOSIS — R079 Chest pain, unspecified: Secondary | ICD-10-CM | POA: Diagnosis not present

## 2023-04-30 LAB — TROPONIN I (HIGH SENSITIVITY)
Troponin I (High Sensitivity): 6 ng/L (ref <18)
Troponin I (High Sensitivity): 5 ng/L (ref <18)

## 2023-04-30 LAB — BASIC METABOLIC PANEL
Anion gap: 9 (ref 5–15)
BUN: 16 mg/dL (ref 8–23)
CO2: 27 mmol/L (ref 22–32)
Calcium: 9.2 mg/dL (ref 8.9–10.3)
Chloride: 102 mmol/L (ref 98–111)
Creatinine, Ser: 0.9 mg/dL (ref 0.44–1.00)
GFR, Estimated: >60 mL/min (ref >60)
Glucose, Bld: 101 mg/dL — ABNORMAL HIGH (ref 70–99)
Potassium: 5.2 mmol/L — ABNORMAL HIGH (ref 3.5–5.1)
Sodium: 138 mmol/L (ref 135–145)

## 2023-04-30 LAB — CBC WITH DIFFERENTIAL/PLATELET
Abs Immature Granulocytes: 0.03 10*3/uL (ref 0.00–0.07)
Basophils Absolute: 0.1 10*3/uL (ref 0.0–0.1)
Basophils Relative: 1 %
Eosinophils Absolute: 0.1 10*3/uL (ref 0.0–0.5)
Eosinophils Relative: 1 %
HCT: 38.4 % (ref 36.0–46.0)
Hemoglobin: 12.7 g/dL (ref 12.0–15.0)
Immature Granulocytes: 0 %
Lymphocytes Relative: 37 %
Lymphs Abs: 2.7 10*3/uL (ref 0.7–4.0)
MCH: 34 pg (ref 26.0–34.0)
MCHC: 33.1 g/dL (ref 30.0–36.0)
MCV: 102.9 fL — ABNORMAL HIGH (ref 80.0–100.0)
Monocytes Absolute: 0.9 10*3/uL (ref 0.1–1.0)
Monocytes Relative: 12 %
Neutro Abs: 3.7 10*3/uL (ref 1.7–7.7)
Neutrophils Relative %: 49 %
Platelets: 239 10*3/uL (ref 150–400)
RBC: 3.73 MIL/uL — ABNORMAL LOW (ref 3.87–5.11)
RDW: 15.2 % (ref 11.5–15.5)
WBC: 7.4 10*3/uL (ref 4.0–10.5)
nRBC: 0 % (ref 0.0–0.2)

## 2023-04-30 LAB — SARS CORONAVIRUS 2 BY RT PCR: SARS Coronavirus 2 by RT PCR: NEGATIVE

## 2023-04-30 MED ORDER — IOHEXOL 350 MG/ML SOLN
75.0000 mL | Freq: Once | INTRAVENOUS | Status: AC | PRN
  Administered 2023-04-30: 75 mL via INTRAVENOUS

## 2023-04-30 NOTE — ED Notes (Signed)
 Discharge instructions reviewed with pt. Pt verbalized understanding. Pt a&ox4, in no acute distress.

## 2023-04-30 NOTE — ED Triage Notes (Signed)
PT arrived POV from home c/o neck pain, swelling on the left side of the neck and fatigue. Pt states she went to [facility] on Friday and they told her she had a blood clot in her neck. Pt was started on blood thinner but now she feels worse, her voice is going horse and all she wants to do is sleep and that is not normal for her. She is also starting to have a little pain on the left side under her rib cage.

## 2023-04-30 NOTE — Discharge Instructions (Signed)
You were seen in the emergency department for continued pain in your neck and swelling along with some fatigue and headache.  You had blood work, COVID test, CAT scan of your head that did not show any significant abnormalities.  Please continue your blood thinner and follow-up with vascular surgery as scheduled.  Return to the emergency department if any worsening or concerning symptoms.

## 2023-04-30 NOTE — ED Provider Notes (Signed)
EMERGENCY DEPARTMENT AT [facility] Provider Note   CSN: [id number] Arrival date & time: 04/30/23  1532     History {Add pertinent medical, surgical, social history, OB history to HPI:1} Chief Complaint  Patient presents with   Neck Pain   Fatigue    Mercedes Decker is a 82 y.o. female.  She was seen a few days ago at [facility] for neck pain.  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  She was placed on Eliquis and is taken 3 doses.  She said today she has been feeling very fatigued.  Still has discomfort in her neck.  Has a headache and has bodyaches.  She denies any difficulty swallowing or breathing.  She has some intermittent left-sided chest discomfort.  She feels her voice is a little hoarse.  The history is provided by the patient.  Neck Pain Pain location:  L side Quality:  Aching Pain severity:  Moderate Onset quality:  Gradual Duration:  3 days Timing:  Constant Progression:  Unchanged Chronicity:  New Relieved by:  Nothing Worsened by:  Bending and twisting Ineffective treatments: anticoagulation. Associated symptoms: chest pain and headaches   Associated symptoms: no fever, no numbness, no visual change and no weakness        Home Medications Prior to Admission medications   Medication Sig Start Date End Date Taking? Authorizing Provider  acetaminophen (TYLENOL) 500 MG tablet Take 500 mg by mouth every 8 (eight) hours as needed (for pain).    [provider]  amLODipine (NORVASC) 5 MG tablet TAKE 1 TABLET(5 MG) BY MOUTH DAILY 02/15/23   [name], NP  amoxicillin-clavulanate (AUGMENTIN) 875-125 MG tablet Take 1 tablet by mouth 2 (two) times daily. For 10 days. 11/16/22   Breeback, Lonna Cobb, PA-C  APIXABAN (ELIQUIS) VTE STARTER PACK (10MG  AND 5MG ) Take as directed on package: start with two-5mg  tablets twice daily for 7 days. On day 8, switch to one-5mg  tablet twice daily. 04/29/23   Pollyann Savoy, MD   Ascorbic Acid (VITAMIN C) 125 MG CHEW Chew 125 mg by mouth daily.    [provider]  aspirin EC 81 MG tablet Take 1 tablet (81 mg total) by mouth daily. Patient taking differently: Take 81 mg by mouth daily. Takes 4 weekly 11/30/15   Laren Boom, DO  Calcium Carbonate (CALCIUM 600 PO) Take 600 mg by mouth daily.    [provider]  Cholecalciferol (VITAMIN D3) 25 MCG (1000 UT) CAPS Take 1,000 Units by mouth daily.    [provider]  cyanocobalamin 100 MCG tablet Take 1 tablet by mouth daily.    [provider]  ezetimibe (ZETIA) 10 MG tablet Take 1 tablet (10 mg total) by mouth daily. 08/05/21 11/16/22  Baldo Daub, MD  gabapentin (NEURONTIN) 100 MG capsule One tab PO qHS for a week, then BID for a week, then TID. 03/09/23   Monica Becton, MD  Magnesium 250 MG TABS Take 1 tablet by mouth every morning.    [provider]  metoprolol succinate (TOPROL-XL) 50 MG 24 hr tablet Take 1 tablet (50 mg total) by mouth daily. Take with or immediately following a meal. 03/09/23   Monica Becton, MD  OVER THE COUNTER MEDICATION Take 500 mg by mouth daily. [place] - Mushroom type vitamin for the brain.    [provider]  predniSONE (DELTASONE) 50 MG tablet One tab PO daily for 5 days.  03/09/23   Monica Becton, MD  Turmeric Curcumin 500 MG CAPS Take 1,500 mg by mouth daily.    [provider]  vitamin E 180 MG (400 UNITS) capsule Take 400 Units by mouth daily.    [provider]      Allergies    Tape, Wound dressing adhesive, Cortisone, Meloxicam, Ace inhibitors, Atorvastatin, Gabapentin, Losartan, and Vicodin [hydrocodone-acetaminophen]    Review of Systems   Review of Systems  Constitutional:  Negative for fever.  HENT:  Negative for trouble swallowing.   Eyes:  Negative for visual disturbance.  Respiratory:  Negative for shortness of breath.   Cardiovascular:  Positive for chest pain.   Gastrointestinal:  Negative for abdominal pain.  Musculoskeletal:  Positive for neck pain.  Neurological:  Positive for headaches. Negative for weakness and numbness.    Physical Exam Updated Vital Signs BP (!) 164/78   Pulse 60   Temp 98.5 F (36.9 C)   Resp 11   Ht 4\' 10"  (1.473 m)   Wt 43.5 kg   SpO2 100%   BMI 20.06 kg/m  Physical Exam Vitals and nursing note reviewed.  Constitutional:      General: She is not in acute distress.    Appearance: Normal appearance. She is well-developed.  HENT:     Head: Normocephalic and atraumatic.  Eyes:     Conjunctiva/sclera: Conjunctivae normal.  Cardiovascular:     Rate and Rhythm: Normal rate and regular rhythm.     Heart sounds: No murmur heard. Pulmonary:     Effort: Pulmonary effort is normal. No respiratory distress.     Breath sounds: Normal breath sounds.  Abdominal:     Palpations: Abdomen is soft.     Tenderness: There is no abdominal tenderness. There is no guarding or rebound.  Musculoskeletal:        General: No deformity.     Cervical back: Neck supple. Tenderness (left anterior neck) present.  Skin:    General: Skin is warm and dry.     Capillary Refill: Capillary refill takes less than 2 seconds.  Neurological:     General: No focal deficit present.     Mental Status: She is alert and oriented to person, place, and time.     Cranial Nerves: No cranial nerve deficit.     Sensory: No sensory deficit.     Motor: No weakness.     ED Results / Procedures / Treatments   Labs (all labs ordered are listed, but only abnormal results are displayed) Labs Reviewed  SARS CORONAVIRUS 2 BY RT PCR  BASIC METABOLIC PANEL  CBC WITH DIFFERENTIAL/PLATELET  TROPONIN I (HIGH SENSITIVITY)    EKG None  Radiology CT VENOGRAM CHEST  Result Date: 04/29/2023 CLINICAL DATA:  Recent dental procedure with neck swelling EXAM: CT VENOGRAM CHEST TECHNIQUE: Contiguous axial images were obtained from the base of the skull  through the chest following the administration of intravenous contrast agent. RADIATION DOSE REDUCTION: This exam was performed according to the departmental dose-optimization program which includes automated exposure control, adjustment of the mA and/or kV according to patient size and/or use of iterative reconstruction technique. CONTRAST:  OMNIPAQUE IOHEXOL 350 MG/ML SOLN COMPARISON:  None Available. FINDINGS: PHARYNX AND LARYNX: The nasopharynx, oropharynx and larynx are normal. Visible portions of the oral cavity, tongue base and floor of mouth are normal. Normal epiglottis, vallecula and pyriform sinuses. The larynx is normal. No retropharyngeal abscess, effusion or lymphadenopathy. SALIVARY GLANDS: Normal parotid, submandibular and  sublingual glands. THYROID: Normal. LYMPH NODES: No enlarged or abnormal density lymph nodes. VASCULAR: There is calcific aortic atherosclerosis minimal calcification at the carotid bifurcations. 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 The other major veins are patent. LIMITED INTRACRANIAL: Normal. VISUALIZED ORBITS: Normal. MASTOIDS AND VISUALIZED PARANASAL SINUSES: No fluid levels or advanced mucosal thickening. No mastoid effusion. SKELETON: No bony spinal canal stenosis. No lytic or blastic lesions. CHEST: Clear. OTHER: Visualized upper abdominal organs are unremarkable. IMPRESSION: 1. 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 Aortic Atherosclerosis (ICD10-I70.0). Electronically Signed   By: Deatra Robinson M.D.   On: 04/29/2023 01:19    Procedures Procedures  {Document cardiac monitor, telemetry assessment procedure when appropriate:1}  Medications Ordered in ED Medications - No data to display  ED Course/ Medical Decision Making/ A&P   {   Click here for ABCD2, HEART and other  calculatorsREFRESH Note before signing :1}                              Medical Decision Making Amount and/or Complexity of Data Reviewed Labs: ordered. Radiology: ordered.   This patient complains of ***; this involves an extensive number of treatment Options and is a complaint that carries with it a high risk of complications and morbidity. The differential includes ***  I ordered, reviewed and interpreted labs, which included *** I ordered medication *** and reviewed PMP when indicated. I ordered imaging studies which included *** and I independently    visualized and interpreted imaging which showed *** Additional history obtained from *** Previous records obtained and reviewed *** I consulted *** and discussed lab and imaging findings and discussed disposition.  Cardiac monitoring reviewed, *** Social determinants considered, *** Critical Interventions: ***  After the interventions stated above, I reevaluated the patient and found *** Admission and further testing considered, ***   {Document critical care time when appropriate:1} {Document review of labs and clinical decision tools ie heart score, Chads2Vasc2 etc:1}  {Document your independent review of radiology images, and any outside records:1} {Document your discussion with family members, caretakers, and with consultants:1} {Document social determinants of health affecting pt's care:1} {Document your decision making why or why not admission, treatments were needed:1} Final Clinical Impression(s) / ED Diagnoses Final diagnoses:  None    Rx / DC Orders ED Discharge Orders     None

## 2023-05-02 ENCOUNTER — Encounter: Payer: Self-pay | Admitting: Sports Medicine

## 2023-05-02 ENCOUNTER — Ambulatory Visit (INDEPENDENT_AMBULATORY_CARE_PROVIDER_SITE_OTHER): Payer: Medicare Other | Admitting: Sports Medicine

## 2023-05-02 ENCOUNTER — Other Ambulatory Visit (HOSPITAL_COMMUNITY): Payer: Self-pay

## 2023-05-02 DIAGNOSIS — M48062 Spinal stenosis, lumbar region with neurogenic claudication: Secondary | ICD-10-CM

## 2023-05-02 NOTE — Assessment & Plan Note (Signed)
Multifactorial low back pain comanaged with Dr. Laurian Brim, has not yet had her nerve conduction/EMG. At the last visit she had tried to lift a heavy object and had increasing pain, she failed prednisone so we did a SI joint injection, added low-dose Neurontin and home PT. She partially improved, she then had epidurals with Dr. Laurian Brim and has improved completely, she can follow up with recurrence of back pain with Dr. Laurian Brim.  Of note she had some neck swelling and was noted to have a left EJ clot, she was referred to cardiology, she is on Eliquis and is established with hematology as well.

## 2023-05-02 NOTE — Progress Notes (Signed)
    Procedures performed today:    None.  Independent interpretation of notes and tests performed by another provider:   None.  Brief History, Exam, Impression, and Recommendations:    Neurogenic claudication due to lumbar spinal stenosis Multifactorial low back pain comanaged with Dr. Laurian Brim, has not yet had her nerve conduction/EMG. At the last visit she had tried to lift a heavy object and had increasing pain, she failed prednisone so we did a SI joint injection, added low-dose Neurontin and home PT. She partially improved, she then had epidurals with Dr. Laurian Brim and has improved completely, she can follow up with recurrence of back pain with Dr. Laurian Brim.  Of note she had some neck swelling and was noted to have a left EJ clot, she was referred to cardiology, she is on Eliquis and is established with hematology as well.    ____________________________________________ Ihor Austin. Benjamin Stain, M.D., ABFM., CAQSM., AME. Primary Care and Sports Medicine [place] [facility]  Adjunct Professor of Family Medicine  [place] [employer]  Restaurant manager, fast food

## 2023-05-03 ENCOUNTER — Other Ambulatory Visit: Payer: Self-pay | Admitting: Medical-Surgical

## 2023-05-03 ENCOUNTER — Ambulatory Visit (HOSPITAL_COMMUNITY)
Admission: RE | Admit: 2023-05-03 | Discharge: 2023-05-03 | Disposition: A | Discharge status: Home / Self Care | Payer: Medicare Other | Source: Ambulatory Visit | Attending: Vascular Surgery | Admitting: Vascular Surgery

## 2023-05-03 DIAGNOSIS — I82890 Acute embolism and thrombosis of other specified veins: Secondary | ICD-10-CM

## 2023-05-03 DIAGNOSIS — I10 Essential (primary) hypertension: Secondary | ICD-10-CM

## 2023-05-03 HISTORY — DX: Acute embolism and thrombosis of other specified veins: I82.890

## 2023-05-03 MED ORDER — APIXABAN 5 MG PO TABS
5.0000 mg | ORAL_TABLET | Freq: Two times a day (BID) | ORAL | 1 refills | Status: AC
Start: 2023-05-03 — End: ?

## 2023-05-03 NOTE — Patient Instructions (Signed)
-  Continue apixaban (Eliquis) 10 mg (two tablets) twice daily for 7 days followed by 5 mg (one tablet) twice daily. -Your refills have been sent to your [facility]. You may need to call the pharmacy to ask them to fill this when you start to run low on your current supply.  -Schedule follow up with primary care or your oncologist.  -It is important to take your medication around the same time every day.  -Avoid NSAIDs like ibuprofen (Advil, Motrin) and naproxen (Aleve) as well as aspirin doses over 100 mg daily. -Tylenol (acetaminophen) is the preferred over the counter pain medication to lower the risk of bleeding. -Be sure to alert all of your health care providers that you are taking an anticoagulant prior to starting a new medication or having a procedure. -Monitor for signs and symptoms of bleeding (abnormal bruising, prolonged bleeding, nose bleeds, bleeding from gums, discolored urine, black tarry stools). If you have fallen and hit your head OR if your bleeding is severe or not stopping, seek emergency care.  -Go to the emergency room if emergent signs and symptoms of new clot occur (new or worse swelling and pain in an arm or leg, shortness of breath, chest pain, fast or irregular heartbeats, lightheadedness, dizziness, fainting, coughing up blood) or if you experience a significant color change (pale or blue) in the extremity that has the clot.   If you have any questions or need to reschedule an appointment, please call [phone] [facility].  If you are having an emergency, call 911 or present to the nearest emergency room.

## 2023-05-03 NOTE — Progress Notes (Signed)
DVT Clinic Note  Name: [name]     MRN: [id number]     DOB: 04/08/1941     Sex: female  PCP: [name], NP  Today's Visit: Visit Information: Initial Visit  Referred to DVT Clinic by: Emergency Department - Dr. Bernette Mayers Referred to CPP by: Dr. Lenell Antu Reason for referral:  Chief Complaint  Patient presents with   External jugular thrombus   HISTORY OF PRESENT ILLNESS: Mercedes Decker is a 82 y.o. 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 Patient states she woke up on 04/28/23 with pain and swelling on the left side of her neck. She presented to the ED that evening. CT venogram showed thrombus in her left external jugular vein. The ED physician spoke with Dr. Randie Heinz who recommended starting Eliquis and referring to the DVT Clinic for outpatient follow up. The patient returned to the ED 04/30/23 with worsening tenderness in her neck, and there were no acute findings. She had dental work done the day prior. She also previously drove 11 hours to [place] the week prior to visit family. She wants to know if either of these events or any of the medications she's taking could have caused the clot. She reports being up to date on cancer screenings. She has recently had unintentional weight loss over the past few months from 105 lb to 96 lb. Endorses fatigue. Denies any recent injury or trauma to the neck or arm.   Positive Thrombotic Risk Factors: Sedentary journey lasting >8 hours within 4 weeks, Older Age Bleeding Risk Factors: Age >65 years, Anticoagulant therapy  Negative Thrombotic Risk Factors: Previous VTE, Recent surgery (within 3 months), Recent trauma (within 3 months), Recent admission to hospital with acute illness (within 3 months), Paralysis, paresis, or recent plaster cast immobilization of lower extremity, Central venous catheterization,  Bed rest >72 hours within 3 months, Pregnancy, Within 6 weeks postpartum, Recent cesarean section (within 3 months), Estrogen therapy, Testosterone therapy, Erythropoiesis-stimulating agent, Recent COVID diagnosis (within 3 months), Active cancer, Non-malignant, chronic inflammatory condition, Known thrombophilic condition, Smoking, Obesity  Rx Insurance Coverage: Medicare Rx Affordability: She used the one time $0 savings card to fill the starter pack. Refills of Eliquis will be $47/month. Preferred Pharmacy: Refills sent to patient's preferred [facility].  Past Medical History:  Diagnosis Date   Arthritis    Cataract Both removed   Clotting disorder (HCC)    Heart murmur    Hypertension    Migraine    Osteoporosis    Prediabetes 08/28/2018    Past Surgical History:  Procedure Laterality Date   ABDOMINAL HYSTERECTOMY     BACK SURGERY     BREAST SURGERY     BUNIONECTOMY     EYE SURGERY     HERNIA REPAIR     INCISIONAL HERNIA REPAIR     KNEE ARTHROSCOPY     SPLENECTOMY, TOTAL  2019    Social History   Socioeconomic History   Marital status: Married    Spouse name: Lawrence   Number of children: 1   Years of education: 12   Highest education level: GED or equivalent  Occupational History   Occupation: Field seismologist    Comment: retired  Tobacco Use   Smoking status: Never   Smokeless tobacco: Never   Tobacco comments:    none  Vaping Use   Vaping status: Never Used  Substance  and Sexual Activity   Alcohol use: Not Currently    Comment: rarely   Drug use: No   Sexual activity: Not Currently    Birth control/protection: Post-menopausal  Other Topics Concern   Not on file  Social History Narrative   Retired. Lives with her husband and her dog. 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 Works around [employer]. Walks daily. She enjoys gardening and enjoy building bird houses.   Social Determinants of Health   Financial Resource Strain: Low Risk  (12/26/2022)   Received  from [facility], [facility]   Overall Financial Resource Strain (CARDIA)    Difficulty of Paying Living Expenses: Not very hard  Food Insecurity: No Food Insecurity (12/26/2022)   Received from [facility], [facility]   Hunger Vital Sign    Worried About Running Out of Food in the Last Year: Never true    Ran Out of Food in the Last Year: Never true  Transportation Needs: No Transportation Needs (12/26/2022)   Received from [facility], [facility]   PRAPARE - Transportation    Lack of Transportation (Medical): No    Lack of Transportation (Non-Medical): No  Physical Activity: Sufficiently Active (12/26/2022)   Received from [facility], [facility]   Exercise Vital Sign    Days of Exercise per Week: 7 days    Minutes of Exercise per Session: 40 min  Stress: No Stress Concern Present (12/26/2022)   Received from [facility], [facility] - Occupational Stress Questionnaire    Feeling of Stress : Only a little  Social Connections: Socially Integrated (12/26/2022)   Received from [facility], [facility]   Social Network    How would you rate your social network (family, work, friends)?: Good participation with social networks  Intimate Partner Violence: Not At Risk (12/26/2022)   Received from [facility], [facility]   HITS    Over the last 12 months how often did your partner physically hurt you?: 1    Over the last 12 months how often did your partner insult you or talk down to you?: 2    Over the last 12 months how often did your partner threaten you with physical harm?: 1    Over the last 12 months how often did your partner scream or curse at you?: 1    Family History  Problem Relation Age of Onset   Alzheimer's disease Mother    Arthritis Mother    Heart disease Father    Hearing loss Father    Cancer Sister    Diabetes Sister    Heart disease Brother    Diabetes Brother    Cancer Brother     Arthritis Sister    Diabetes Sister    Arthritis Daughter    Hearing loss Brother    Hearing loss Sister    Heart disease Brother    Stroke Sister     Allergies as of 05/03/2023 - Review Complete 05/03/2023  Allergen Reaction Noted   Tape Other (See Comments) 05/28/2015   Wound dressing adhesive Other (See Comments) 05/28/2015   Cortisone Other (See Comments) 03/09/2015   Meloxicam Other (See Comments) 10/03/2011   Ace inhibitors Cough 09/23/2012   Atorvastatin Other (See Comments) 03/14/2016   Gabapentin Other (See Comments) 09/02/2019   Losartan Other (See Comments) 06/25/2014   Vicodin [hydrocodone-acetaminophen] Other (See Comments) 09/03/2012    Current Outpatient Medications on File Prior to Encounter  Medication Sig Dispense Refill  acetaminophen (TYLENOL) 500 MG tablet Take 500 mg by mouth every 8 (eight) hours as needed (for pain).     APIXABAN (ELIQUIS) VTE STARTER PACK (10MG  AND 5MG ) Take as directed on package: start with two-5mg  tablets twice daily for 7 days. On day 8, switch to one-5mg  tablet twice daily. 74 each 0   Ascorbic Acid (VITAMIN C) 125 MG CHEW Chew 125 mg by mouth daily.     aspirin EC 81 MG tablet Take 1 tablet (81 mg total) by mouth daily. (Patient taking differently: Take 81 mg by mouth daily. Takes 4 weekly)     Calcium Carbonate (CALCIUM 600 PO) Take 600 mg by mouth daily.     Cholecalciferol (VITAMIN D3) 25 MCG (1000 UT) CAPS Take 1,000 Units by mouth daily.     cyanocobalamin 100 MCG tablet Take 1 tablet by mouth daily.     ezetimibe (ZETIA) 10 MG tablet Take 1 tablet (10 mg total) by mouth daily. 90 tablet 3   ferrous sulfate 324 MG TBEC Take 324 mg by mouth daily with breakfast.     gabapentin (NEURONTIN) 100 MG capsule One tab PO qHS for a week, then BID for a week, then TID. 90 capsule 11   Magnesium 250 MG TABS Take 1 tablet by mouth every morning.     metoprolol succinate (TOPROL-XL) 50 MG 24 hr tablet Take 1 tablet (50 mg total) by mouth  daily. Take with or immediately following a meal. 90 tablet 3   OVER THE COUNTER MEDICATION Take 500 mg by mouth daily. [place] - Mushroom type vitamin for the brain.     OVER THE COUNTER MEDICATION Total Beets - takes 1 gummy daily     Turmeric Curcumin 500 MG CAPS Take 1,500 mg by mouth daily.     valACYclovir (VALTREX) 500 MG tablet Take 500 mg by mouth daily.     vitamin E 180 MG (400 UNITS) capsule Take 400 Units by mouth daily.     No current facility-administered medications on file prior to encounter.   REVIEW OF SYSTEMS:  Review of Systems  Respiratory:  Negative for shortness of breath.   Cardiovascular:  Negative for chest pain and palpitations.  Musculoskeletal:  Positive for neck pain.  Neurological:  Negative for dizziness.   PHYSICAL EXAMINATION:  Physical Exam Musculoskeletal:        General: Swelling (L side of neck) and tenderness (L side of neck) present.  Skin:    Findings: Erythema present.    LABS:  CBC     Component Value Date/Time   WBC 7.4 04/30/2023 1805   RBC 3.73 (L) 04/30/2023 1805   HGB 12.7 04/30/2023 1805   HCT 38.4 04/30/2023 1805   PLT 239 04/30/2023 1805   MCV 102.9 (H) 04/30/2023 1805   MCH 34.0 04/30/2023 1805   MCHC 33.1 04/30/2023 1805   RDW 15.2 04/30/2023 1805   LYMPHSABS 2.7 04/30/2023 1805   MONOABS 0.9 04/30/2023 1805   EOSABS 0.1 04/30/2023 1805   BASOSABS 0.1 04/30/2023 1805    Hepatic Function      Component Value Date/Time   PROT 7.4 12/29/2021 0000   ALBUMIN 3.7 01/24/2017 0908   AST 51 (H) 12/29/2021 0000   ALT 49 (H) 12/29/2021 0000   ALKPHOS 66 01/24/2017 0908   BILITOT 0.4 12/29/2021 0000    Renal Function   Lab Results  Component Value Date   CREATININE 0.90 04/30/2023   CREATININE 1.07 (H) 04/28/2023   CREATININE 0.84 12/29/2021  Estimated Creatinine Clearance: 30.6 mL/min (by C-G formula based on SCr of 0.9 mg/dL).   Imaging:  04/29/23 CT venogram IMPRESSION: 1. Thrombosis of the left external  jugular vein, which is occluded near its origin with reconstitution at the level of the mandible.  ASSESSMENT: Location of thrombosis: Left upper extremity Cause of thrombosis: unprovoked  Dr. Lenell Antu came to evaluate the patient with me. Since the clot is in a superficial vein, we will plan to anticoagulate for 3 months. No need for repeat imaging at end of treatment. Would not consider dental work or recent prolonged car ride to cause a superficial upper extremity clot. She's taking no medications that increase the risk of thrombosis. She had no trauma or injury to the neck or arm and no recent lines placed. Given the unknown cause of this combined with the patient's unintended weight loss from 105 lb to 96 lb in the past few months, Dr. Lenell Antu recommends she see her PCP to ensure she is up to date on cancer screenings and to perform additional testing (see his note) for further workup. She has a history of a pancreatic mass which was found to be benign s/p distal pancreatectomy and splenectomy in 2019. She is tolerating Eliquis well thus far. No medication adherence or access concerns at this time. I provided her with refills to complete 3 months of treatment. I counseled her extensively on Eliquis today and she has no further questions or needs at this time.   PLAN: -Continue apixaban (Eliquis) 10 mg twice daily for 7 days followed by 5 mg twice daily. -Expected duration of therapy: 3 months. Therapy started on 04/28/23. -Patient educated on purpose, proper use and potential adverse effects of apixaban (Eliquis). -Discussed importance of taking medication around the same time every day. -Advised patient of medications to avoid (NSAIDs, aspirin doses >100 mg daily). -Educated that Tylenol (acetaminophen) is the preferred analgesic to lower the risk of bleeding. -Advised patient to alert all providers of anticoagulation therapy prior to starting a new medication or having a procedure. -Emphasized  importance of monitoring for signs and symptoms of bleeding (abnormal bruising, prolonged bleeding, nose bleeds, bleeding from gums, discolored urine, black tarry stools). -Educated patient to present to the ED if emergent signs and symptoms of new thrombosis occur. -She can use a warm compress to help with the tenderness and swelling.   Follow up: with PCP. DVT Clinic as needed.  Pervis Hocking, PharmD, P[place]y [place], CPP Deep Vein Thrombosis [facility] Clinical Pharmacist Practitioner Office: [phone]

## 2023-05-04 LAB — CULTURE, BLOOD (ROUTINE X 2)
Culture: NO GROWTH
Culture: NO GROWTH
Special Requests: ADEQUATE

## 2023-05-05 ENCOUNTER — Telehealth: Payer: Self-pay

## 2023-05-05 NOTE — Transitions of Care (Post Inpatient/ED Visit) (Signed)
05/05/2023  Name: Mercedes Decker MRN: [id number] DOB: 03-28-1941  Today's TOC FU Call Status: Today's TOC FU Call Status:: Successful TOC FU Call Completed TOC FU Call Complete Date: 05/05/23 Patient's Name and Date of Birth confirmed.   Red on EMMI-ED Discharge Alert Date & Reason:05/01/23 "Scheduled follow-up appt? No"  Transition Care Management Follow-up Telephone Call Date of Discharge: 04/30/23 Discharge Facility: [facility]) Type of Discharge: Emergency Department Reason for ED Visit: Other: ("neck pain") How have you been since you were released from the hospital?: Better (Pt states her neck pain has gotten " a little better." She was told by MD it would take about 3months for clot to dissolve. She is taking Tylenol and using heat pad prn.) Any questions or concerns?: No  Items Reviewed: Did you receive and understand the discharge instructions provided?: Yes Medications obtained,verified, and reconciled?: Yes (Medications Reviewed) Any new allergies since your discharge?: No Dietary orders reviewed?: NA Do you have support at home?: Yes People in Home: spouse Name of Support/Comfort Primary Source: Lyman Bishop  Medications Reviewed Today: Medications Reviewed Today     Reviewed by Charlyn Minerva, RN (Registered Nurse) on 05/05/23 at 1035  Med List Status: <None>   Medication Order Taking? Sig Documenting Provider Last Dose Status Informant  acetaminophen (TYLENOL) 500 MG tablet [id number] Yes Take 500 mg by mouth every 8 (eight) hours as needed (for pain). [provider] Taking Active   amLODipine (NORVASC) 5 MG tablet [id number] Yes NEEDS APPOINTMENT FOR FURTHER REFILLS. TAKE 1 TABLET(5 MG) BY MOUTH DAILY [name], NP Taking Active   apixaban (ELIQUIS) 5 MG TABS tablet [id number] Yes Take 1 tablet (5 mg total) by mouth 2 (two) times daily. Start taking after completion of starter pack. Pervis Hocking B, RPH-CPP Taking Active   APIXABAN  Everlene Balls) VTE STARTER PACK (10MG  AND 5MG ) [id number]  Take as directed on package: start with two-5mg  tablets twice daily for 7 days. On day 8, switch to one-5mg  tablet twice daily. Pollyann Savoy, MD  Active   Ascorbic Acid (VITAMIN C) 125 MG CHEW [id number] Yes Chew 125 mg by mouth daily. [provider] Taking Active   aspirin EC 81 MG tablet [id number] Yes Take 1 tablet (81 mg total) by mouth daily.  Patient taking differently: Take 81 mg by mouth daily. Takes 4 weekly   Laren Boom, DO Taking Active   Calcium Carbonate (CALCIUM 600 PO) [id number] Yes Take 600 mg by mouth daily. [provider] Taking Active   Cholecalciferol (VITAMIN D3) 25 MCG (1000 UT) CAPS [id number] Yes Take 1,000 Units by mouth daily. [provider] Taking Active   cyanocobalamin 100 MCG tablet [id number] Yes Take 1 tablet by mouth daily. [provider] Taking Active   ezetimibe (ZETIA) 10 MG tablet [id number]  Take 1 tablet (10 mg total) by mouth daily. Baldo Daub, MD  Expired 05/03/23 2359   ferrous sulfate 324 MG TBEC [id number] Yes Take 324 mg by mouth daily with breakfast. [provider] Taking Active   gabapentin (NEURONTIN) 100 MG capsule [id number] Yes One tab PO qHS for a week, then BID for a week, then TID. Monica Becton, MD Taking Active   Magnesium 250 MG TABS [id number] Yes Take 1 tablet by mouth every morning. [provider] Taking Active   metoprolol succinate (TOPROL-XL) 50 MG 24 hr tablet [id number] Yes Take 1 tablet (50 mg total) by mouth daily. Take with or immediately following a meal. Monica Becton,  MD Taking Active   OVER THE COUNTER MEDICATION [id number] Yes Take 500 mg by mouth daily. Lion [place] - Mushroom type vitamin for the brain. [provider] Taking Active   OVER THE COUNTER MEDICATION [id number] Yes Total Beets - takes 1 gummy daily [provider] Taking Active   Turmeric Curcumin 500 MG CAPS [id number]  Yes Take 1,500 mg by mouth daily. [provider] Taking Active   valACYclovir (VALTREX) 500 MG tablet [id number] Yes Take 500 mg by mouth daily. [provider] Taking Active   vitamin E 180 MG (400 UNITS) capsule [id number] Yes Take 400 Units by mouth daily. [provider] Taking Active             Home Care and Equipment/Supplies: Were Home Health Services Ordered?: NA Any new equipment or medical supplies ordered?: NA  Functional Questionnaire: Do you need assistance with bathing/showering or dressing?: No Do you need assistance with meal preparation?: No Do you need assistance with eating?: No Do you have difficulty maintaining continence: No Do you need assistance with getting out of bed/getting out of a chair/moving?: No Do you have difficulty managing or taking your medications?: No  Follow up appointments reviewed: PCP Follow-up appointment confirmed?: Yes Date of PCP follow-up appointment?: 05/02/23 Follow-up Provider: Dr. Lorenda Peck Specialist [facility] Follow-up appointment confirmed?: Yes Date of Specialist follow-up appointment?: 05/03/23 Follow-Up Specialty Provider:: Vascular Surgeon Do you need transportation to your follow-up appointment?: No Do you understand care options if your condition(s) worsen?: Yes-patient verbalized understanding  SDOH Interventions Today    Flowsheet Row Most Recent Value  SDOH Interventions   Food Insecurity Interventions Intervention Not Indicated  Transportation Interventions Intervention Not Indicated      TOC Interventions Today    Flowsheet Row Most Recent Value  TOC Interventions   TOC Interventions Discussed/Reviewed TOC Interventions Discussed      Interventions Today    Flowsheet Row Most Recent Value  Chronic Disease   Chronic disease during today's visit Other  [DVT]  General Interventions   General Interventions Discussed/Reviewed General Interventions Discussed, Doctor Visits   Doctor Visits Discussed/Reviewed Doctor Visits Discussed, PCP  PCP/Specialist Visits Compliance with follow-up visit  Education Interventions   Education Provided Provided Education  Provided Verbal Education On Nutrition, When to see the doctor, Medication, Other  [pain mgmt]  Nutrition Interventions   Nutrition Discussed/Reviewed Nutrition Discussed  Pharmacy Interventions   Pharmacy Dicussed/Reviewed Pharmacy Topics Discussed, Medications and their functions        Alessandra Grout [facility] Care Management Care Management Community Coordinator Direct Phone: [phone] Toll Free: [phone] Fax: [phone]

## 2023-05-08 ENCOUNTER — Telehealth: Payer: Self-pay | Admitting: *Deleted

## 2023-05-08 NOTE — Progress Notes (Unsigned)
  Care Coordination  Outreach Note  05/08/2023 Name: Mercedes Decker MRN: [id number] DOB: 1941/07/01   Care Coordination Outreach Attempts: An unsuccessful telephone outreach was attempted today to offer the patient information about available care coordination services.  Follow Up Plan:  Additional outreach attempts will be made to offer the patient care coordination information and services.   Encounter Outcome:  No Answer  Burman Nieves, CCMA Care Coordination Care Guide Direct Dial: [phone]

## 2023-05-09 NOTE — Progress Notes (Signed)
  Care Coordination   Note   05/09/2023 Name: Mercedes Decker MRN: [id number] DOB: 1941-02-17  Mercedes Decker is a 82 y.o. year old female who sees [name], NP for primary care. I reached out to Eric Form by phone today to offer care coordination services.  Ms. Curvin was given information about Care Coordination services today including:   The Care Coordination services include support from the care team which includes your Nurse Coordinator, Clinical Social Worker, or Pharmacist.  The Care Coordination team is here to help remove barriers to the health concerns and goals most important to you. Care Coordination services are voluntary, and the patient may decline or stop services at any time by request to their care team member.   Care Coordination Consent Status: Patient agreed to services and verbal consent obtained.   Follow up plan:  Telephone appointment with care coordination team member scheduled for:  05/17/2023  Encounter Outcome:  Patient Scheduled  Burman Nieves, [facility] Care Coordination Care Guide Direct Dial: [phone]

## 2023-05-15 ENCOUNTER — Ambulatory Visit (INDEPENDENT_AMBULATORY_CARE_PROVIDER_SITE_OTHER): Payer: Medicare Other | Admitting: Medical-Surgical

## 2023-05-15 DIAGNOSIS — Z0000 Encounter for general adult medical examination without abnormal findings: Secondary | ICD-10-CM | POA: Diagnosis not present

## 2023-05-15 DIAGNOSIS — Z780 Asymptomatic menopausal state: Secondary | ICD-10-CM

## 2023-05-15 NOTE — Patient Instructions (Addendum)
MEDICARE ANNUAL WELLNESS VISIT Health Maintenance Summary and Written Plan of Care  Mercedes Decker ,  Thank you for allowing me to perform your Medicare Annual Wellness Visit and for your ongoing commitment to your health.   Health Maintenance & Immunization History Health Maintenance  Topic Date Due   DTaP/Tdap/Td (2 - Td or Tdap) 04/19/2018   COVID-19 Vaccine (5 - 2023-24 season) 05/31/2023 (Originally 04/30/2023)   Zoster Vaccines- Shingrix (1 of 2) 08/14/2023 (Originally 09/07/1959)   INFLUENZA VACCINE  11/27/2023 (Originally 03/30/2023)   DEXA SCAN  05/14/2024 (Originally 04/22/2023)   Medicare Annual Wellness (AWV)  05/14/2024   Pneumonia Vaccine 50+ Years old  Completed   HPV VACCINES  Aged Out   Immunization History  Administered Date(s) Administered   Fluad Quad(high Dose 65+) 04/29/2019, 05/17/2022   HIB (PRP-OMP) 07/04/2017   Influenza Split 05/29/2010, 05/18/2011, 05/15/2012   Influenza, High Dose Seasonal PF 04/26/2016, 05/06/2017, 07/04/2017, 05/03/2018   Influenza,inj,Quad PF,6+ Mos 07/06/2020   Influenza-Unspecified 05/29/2010, 05/18/2011, 05/15/2012, 05/29/2013, 05/28/2014, 05/01/2015, 06/29/2016, 05/29/2021   Meningococcal B, OMV 03/06/2023   Meningococcal Conjugate 11/07/2017, 01/02/2018   Meningococcal Mcv4o 03/06/2023   PFIZER Comirnaty(Gray Top)Covid-19 Tri-Sucrose Vaccine 01/04/2021   PFIZER(Purple Top)SARS-COV-2 Vaccination 09/19/2019, 10/10/2019, 06/02/2020   Pneumococcal Conjugate-13 04/26/2016, 07/04/2017   Pneumococcal Polysaccharide-23 03/30/2007, 11/07/2017, 03/06/2023   Pneumococcal-Unspecified 03/30/2007   Tdap 04/19/2008   Zoster, Live 08/29/2008    These are the patient goals that we discussed:  Goals Addressed               This Visit's Progress     Patient Stated (pt-stated)        Patient stated that she would like continue to be healthy active.         This is a list of Health Maintenance Items that are overdue or due  now: Influenza vaccine Td vaccine Shingles vaccine Bone density scan  Orders/Referrals Placed Today: Orders Placed This Encounter  Procedures   DEXAScan    Standing Status:   Future    Standing Expiration Date:   05/14/2024    Scheduling Instructions:     Please call patient to schedule.    Order Specific Question:   Reason for exam:    Answer:   post menopausal    Order Specific Question:   Preferred imaging location?    Answer:   [name]   (Contact our referral department at [phone] if you have not spoken with someone about your referral appointment within the next 5 days)    Follow-up Plan Follow-up with [name], NP as planned Schedule shingles vaccine and td vaccine at the pharmacy.  Influenza vaccine can be completed at the pharmacy or in office. Medicare wellness visit in one year.  Patient will access AVS on my chart.      Health Maintenance, Female Adopting a healthy lifestyle and getting preventive care are important in promoting health and wellness. Ask your health care provider about: The right schedule for you to have regular tests and exams. Things you can do on your own to prevent diseases and keep yourself healthy. What should I know about diet, weight, and exercise? Eat a healthy diet  Eat a diet that includes plenty of vegetables, fruits, low-fat dairy products, and lean protein. Do not eat a lot of foods that are high in solid fats, added sugars, or sodium. Maintain a healthy weight Body mass index (BMI) is used to identify weight problems. It estimates body fat based on height and weight.  Your health care provider can help determine your BMI and help you achieve or maintain a healthy weight. Get regular exercise Get regular exercise. This is one of the most important things you can do for your health. Most adults should: Exercise for at least 150 minutes each week. The exercise should increase your heart rate and make you sweat  (moderate-intensity exercise). Do strengthening exercises at least twice a week. This is in addition to the moderate-intensity exercise. Spend less time sitting. Even light physical activity can be beneficial. Watch cholesterol and blood lipids Have your blood tested for lipids and cholesterol at 83 years of age, then have this test every 5 years. Have your cholesterol levels checked more often if: Your lipid or cholesterol levels are high. You are older than 83 years of age. You are at high risk for heart disease. What should I know about cancer screening? Depending on your health history and family history, you may need to have cancer screening at various ages. This may include screening for: Breast cancer. Cervical cancer. Colorectal cancer. Skin cancer. Lung cancer. What should I know about heart disease, diabetes, and high blood pressure? Blood pressure and heart disease High blood pressure causes heart disease and increases the risk of stroke. This is more likely to develop in people who have high blood pressure readings or are overweight. Have your blood pressure checked: Every 3-5 years if you are 49-73 years of age. Every year if you are 4 years old or older. Diabetes Have regular diabetes screenings. This checks your fasting blood sugar level. Have the screening done: Once every three years after age 17 if you are at a normal weight and have a low risk for diabetes. More often and at a younger age if you are overweight or have a high risk for diabetes. What should I know about preventing infection? Hepatitis B If you have a higher risk for hepatitis B, you should be screened for this virus. Talk with your health care provider to find out if you are at risk for hepatitis B infection. Hepatitis C Testing is recommended for: Everyone born from [birth year] through 1966. Anyone with known risk factors for hepatitis C. Sexually transmitted infections (STIs) Get screened for STIs,  including gonorrhea and chlamydia, if: You are sexually active and are younger than 83 years of age. You are older than 83 years of age and your health care provider tells you that you are at risk for this type of infection. Your sexual activity has changed since you were last screened, and you are at increased risk for chlamydia or gonorrhea. Ask your health care provider if you are at risk. Ask your health care provider about whether you are at high risk for HIV. Your health care provider may recommend a prescription medicine to help prevent HIV infection. If you choose to take medicine to prevent HIV, you should first get tested for HIV. You should then be tested every 3 months for as long as you are taking the medicine. Pregnancy If you are about to stop having your period (premenopausal) and you may become pregnant, seek counseling before you get pregnant. Take 400 to 800 micrograms (mcg) of folic acid every day if you become pregnant. Ask for birth control (contraception) if you want to prevent pregnancy. Osteoporosis and menopause Osteoporosis is a disease in which the bones lose minerals and strength with aging. This can result in bone fractures. If you are 6 years old or older, or if you are  at risk for osteoporosis and fractures, ask your health care provider if you should: Be screened for bone loss. Take a calcium or vitamin D supplement to lower your risk of fractures. Be given hormone replacement therapy (HRT) to treat symptoms of menopause. Follow these instructions at home: Alcohol use Do not drink alcohol if: Your health care provider tells you not to drink. You are pregnant, may be pregnant, or are planning to become pregnant. If you drink alcohol: Limit how much you have to: 0-1 drink a day. Know how much alcohol is in your drink. In the [place] one drink equals one 12 oz bottle of beer (355 mL), one 5 oz glass of wine (148 mL), or one 1 oz glass of hard liquor (44  mL). Lifestyle Do not use any products that contain nicotine or tobacco. These products include cigarettes, chewing tobacco, and vaping devices, such as e-cigarettes. If you need help quitting, ask your health care provider. Do not use street drugs. Do not share needles. Ask your health care provider for help if you need support or information about quitting drugs. General instructions Schedule regular health, dental, and eye exams. Stay current with your vaccines. Tell your health care provider if: You often feel depressed. You have ever been abused or do not feel safe at home. Summary Adopting a healthy lifestyle and getting preventive care are important in promoting health and wellness. Follow your health care provider's instructions about healthy diet, exercising, and getting tested or screened for diseases. Follow your health care provider's instructions on monitoring your cholesterol and blood pressure. This information is not intended to replace advice given to you by your health care provider. Make sure you discuss any questions you have with your health care provider. Document Revised: 01/04/2021 Document Reviewed: 01/04/2021 Elsevier Patient Education  2024 [employer].

## 2023-05-15 NOTE — Progress Notes (Signed)
MEDICARE ANNUAL WELLNESS VISIT  05/15/2023  Telephone Visit Disclaimer This Medicare AWV was conducted by telephone due to national recommendations for restrictions regarding the COVID-19 Pandemic (e.g. social distancing).  I verified, using two identifiers, that I am speaking with Mercedes Decker or their authorized healthcare agent. I discussed the limitations, risks, security, and privacy concerns of performing an evaluation and management service by telephone and the potential availability of an in-person appointment in the future. The patient expressed understanding and agreed to proceed.  Location of Patient: Home Location of Provider (nurse):  In the office.  Subjective:    Mercedes Decker is a 82 y.o. female patient of [name], NP who had a Medicare Annual Wellness Visit today via telephone. Mercedes Decker is Retired and lives with their family. she has 1 child. she reports that she is socially active and does interact with friends/family regularly. she is moderately physically active and enjoys gardening and building bird houses.  Patient Care Team: [name], NP as PCP - General (Nurse Practitioner) Thomasene Ripple, DO as PCP - Cardiology (Cardiology)     05/15/2023    8:02 AM 04/28/2023    9:06 PM 05/09/2022    8:10 AM 03/29/2021    9:06 AM 12/03/2018    8:10 AM 10/24/2018    7:17 AM 07/10/2018    8:07 AM  Advanced Directives  Does Patient Have a Medical Advance Directive? Yes No Yes Yes Yes Yes Yes  Type of Advance Directive Living will;Healthcare Power of Attorney  Living will Living will;Healthcare Power of [employer] Power of [place];Living will Healthcare Power of [place];Living will Healthcare Power of [place];Living will  Does patient want to make changes to medical advance directive?   No - Patient declined No - Guardian declined No - Patient declined    Copy of Healthcare Power of Attorney in Chart? Yes - validated most recent copy scanned in chart (See row  information)   Yes - validated most recent copy scanned in chart (See row information) Yes - validated most recent copy scanned in chart (See row information) No - copy requested No - copy requested    Hospital Utilization Over the Past 12 Months: # of hospitalizations or ER visits: 1 # of surgeries: 0  Review of Systems    Patient reports that her overall health is worse compared to last year.  History obtained from chart review and the patient  Patient Reported Readings (BP, Pulse, CBG, Weight, etc) none Per patient no change in vitals since last visit, unable to obtain new vitals due to telehealth visit  Pain Assessment Pain : No/denies pain     Current Medications & Allergies (verified) Allergies as of 05/15/2023       Reactions   Tape Other (See Comments)   BANDAIDS AND ADHESIVE TAPES TEAR SKIN (VERY THIN SKIN)  REQUESTING HYPOALLERGENIC TAPE.  "OUCHLESS TAPE." IS OK   Wound Dressing Adhesive Other (See Comments)   BANDAIDS AND ADHESIVE TAPES TEAR SKIN (VERY THIN SKIN)  REQUESTING HYPOALLERGENIC TAPE.  "OUCHLESS TAPE." IS OK   Cortisone Other (See Comments)   Flushing and worsening joint pain   Meloxicam Other (See Comments)   Too sleepy   Ace Inhibitors Cough   Atorvastatin Other (See Comments)   Possible allergy   Gabapentin Other (See Comments)   Caused leg cramping   Losartan Other (See Comments)   Sweats, cramps   Vicodin [hydrocodone-acetaminophen] Other (See Comments)   Pt states that she stops breathing when  she takes  Pain meds        Medication List        Accurate as of May 15, 2023  8:16 AM. If you have any questions, ask your nurse or doctor.          acetaminophen 500 MG tablet Commonly known as: TYLENOL Take 500 mg by mouth every 8 (eight) hours as needed (for pain).   amLODipine 5 MG tablet Commonly known as: NORVASC NEEDS APPOINTMENT FOR FURTHER REFILLS. TAKE 1 TABLET(5 MG) BY MOUTH DAILY   Apixaban Starter Pack (10mg  and  5mg ) Commonly known as: ELIQUIS STARTER PACK Take as directed on package: start with two-5mg  tablets twice daily for 7 days. On day 8, switch to one-5mg  tablet twice daily.   apixaban 5 MG Tabs tablet Commonly known as: Eliquis Take 1 tablet (5 mg total) by mouth 2 (two) times daily. Start taking after completion of starter pack.   aspirin EC 81 MG tablet Take 1 tablet (81 mg total) by mouth daily. What changed: additional instructions   CALCIUM 600 PO Take 600 mg by mouth daily.   cyanocobalamin 100 MCG tablet Take 1 tablet by mouth daily.   ezetimibe 10 MG tablet Commonly known as: ZETIA Take 1 tablet (10 mg total) by mouth daily.   ferrous sulfate 324 MG Tbec Take 324 mg by mouth daily with breakfast.   gabapentin 100 MG capsule Commonly known as: NEURONTIN One tab PO qHS for a week, then BID for a week, then TID.   Magnesium 250 MG Tabs Take 1 tablet by mouth every morning.   metoprolol succinate 50 MG 24 hr tablet Commonly known as: TOPROL-XL Take 1 tablet (50 mg total) by mouth daily. Take with or immediately following a meal.   OVER THE COUNTER MEDICATION Take 500 mg by mouth daily. [place] - Mushroom type vitamin for the brain.   OVER THE COUNTER MEDICATION Total Beets - takes 1 gummy daily   Turmeric Curcumin 500 MG Caps Take 1,500 mg by mouth daily.   valACYclovir 500 MG tablet Commonly known as: VALTREX Take 500 mg by mouth daily.   Vitamin C 125 MG Chew Chew 125 mg by mouth daily.   Vitamin D3 25 MCG (1000 UT) Caps Take 1,000 Units by mouth daily.   vitamin E 180 MG (400 UNITS) capsule Take 400 Units by mouth daily.        History (reviewed): Past Medical History:  Diagnosis Date   Arthritis    Cataract Both removed   Clotting disorder (HCC)    Heart murmur    Hypertension    Migraine    Osteoporosis    Prediabetes 08/28/2018   Past Surgical History:  Procedure Laterality Date   ABDOMINAL HYSTERECTOMY     BACK SURGERY      BREAST SURGERY     BUNIONECTOMY     EYE SURGERY     HERNIA REPAIR     INCISIONAL HERNIA REPAIR     KNEE ARTHROSCOPY     SPLENECTOMY, TOTAL  2019   Family History  Problem Relation Age of Onset   Alzheimer's disease Mother    Arthritis Mother    Heart disease Father    Hearing loss Father    Cancer Sister    Diabetes Sister    Heart disease Brother    Diabetes Brother    Cancer Brother    Arthritis Sister    Diabetes Sister    Arthritis Daughter  Hearing loss Brother    Hearing loss Sister    Heart disease Brother    Stroke Sister    Social History   Socioeconomic History   Marital status: Married    Spouse name: Lyman Bishop   Number of children: 1   Years of education: 12   Highest education level: GED or equivalent  Occupational History   Occupation: Field seismologist    Comment: retired  Tobacco Use   Smoking status: Never   Smokeless tobacco: Never   Tobacco comments:    none  Vaping Use   Vaping status: Never Used  Substance and Sexual Activity   Alcohol use: Not Currently    Comment: rarely   Drug use: No   Sexual activity: Not Currently    Birth control/protection: Post-menopausal  Other Topics Concern   Not on file  Social History Narrative   Retired. Lives with her husband and her dog. 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 Works around [employer]. Walks daily. She enjoys gardening and enjoy building bird houses.   Social Determinants of Health   Financial Resource Strain: Low Risk  (05/08/2023)   Overall Financial Resource Strain (CARDIA)    Difficulty of Paying Living Expenses: Not hard at all  Food Insecurity: No Food Insecurity (05/08/2023)   Hunger Vital Sign    Worried About Running Out of Food in the Last Year: Never true    Ran Out of Food in the Last Year: Never true  Transportation Needs: No Transportation Needs (05/08/2023)   PRAPARE - Administrator, Civil Service (Medical): No    Lack of Transportation (Non-Medical): No  Physical  Activity: Sufficiently Active (05/08/2023)   Exercise Vital Sign    Days of Exercise per Week: 7 days    Minutes of Exercise per Session: 30 min  Stress: Stress Concern Present (05/08/2023)   [employer] of Occupational Health - Occupational Stress Questionnaire    Feeling of Stress : To some extent  Social Connections: Moderately Integrated (05/15/2023)   Social Connection and Isolation Panel [NHANES]    Frequency of Communication with Friends and Family: More than three times a week    Frequency of Social Gatherings with Friends and Family: Once a week    Attends Religious Services: More than 4 times per year    Active Member of [employer] or Organizations: No    Attends Banker Meetings: Never    Marital Status: Married    Activities of Daily Living    05/08/2023    7:20 PM  In your present state of health, do you have any difficulty performing the following activities:  Hearing? 0  Vision? 0  Difficulty concentrating or making decisions? 0  Walking or climbing stairs? 0  Dressing or bathing? 0  Doing errands, shopping? 0  Preparing Food and eating ? N  Using the Toilet? N  In the past six months, have you accidently leaked urine? Y  Do you have problems with loss of bowel control? N  Managing your Medications? N  Managing your Finances? N  Housekeeping or managing your Housekeeping? N    Patient Education/ Literacy How often do you need to have someone help you when you read instructions, pamphlets, or other written materials from your doctor or pharmacy?: 1 - Never What is the last grade level you completed in school?: 12th grade  Exercise    Diet Patient reports consuming 2 meals a day and 0 snack(s) a day Patient reports that her  primary diet is: Regular Patient reports that she does have regular access to food.   Depression Screen    05/15/2023    8:03 AM 05/09/2022    8:14 AM 12/28/2021   11:28 AM 08/09/2021    9:21 AM 03/29/2021    9:07 AM 03/05/2021     8:29 AM 02/04/2019   10:06 AM  PHQ 2/9 Scores  PHQ - 2 Score 0 0 0 0 0 0 0     Fall Risk    05/15/2023    8:03 AM 05/08/2023    7:20 PM 05/09/2022    8:14 AM 05/04/2022   10:36 AM 12/28/2021   11:28 AM  Fall Risk   Falls in the past year? 0 0 0 0 0  Number falls in past yr: 0 0 0 0 0  Injury with Fall? 0 0 0 0 0  Risk for fall due to : No Fall Risks  No Fall Risks  No Fall Risks  Follow up Falls evaluation completed  Falls evaluation completed  Falls evaluation completed     Objective:  Mercedes Decker seemed alert and oriented and she participated appropriately during our telephone visit.  Blood Pressure Weight BMI  BP Readings from Last 3 Encounters:  04/30/23 (!) 122/58  04/29/23 132/78  11/16/22 (!) 137/52   Wt Readings from Last 3 Encounters:  04/30/23 96 lb (43.5 kg)  04/28/23 97 lb (44 kg)  11/16/22 103 lb (46.7 kg)   BMI Readings from Last 1 Encounters:  04/30/23 20.06 kg/m    *Unable to obtain current vital signs, weight, and BMI due to telephone visit type  Hearing/Vision  Mercedes Decker did not seem to have difficulty with hearing/understanding during the telephone conversation Reports that she has had a formal eye exam by an eye care professional within the past year Reports that she has not had a formal hearing evaluation within the past year *Unable to fully assess hearing and vision during telephone visit type  Cognitive Function:    05/15/2023    8:09 AM 05/09/2022    8:16 AM 03/29/2021    9:16 AM 12/03/2018    8:16 AM  6CIT Screen  What Year? 0 points 0 points 0 points 0 points  What month? 0 points 0 points 0 points 0 points  What time? 0 points 0 points 0 points 0 points  Count back from 20 0 points 0 points 0 points 0 points  Months in reverse 0 points 2 points 0 points 0 points  Repeat phrase 0 points 2 points 0 points 2 points  Total Score 0 points 4 points 0 points 2 points   (Normal:0-7, Significant for Dysfunction: >8)  Normal Cognitive Function  Screening: Yes   Immunization & Health Maintenance Record Immunization History  Administered Date(s) Administered   Fluad Quad(high Dose 65+) 04/29/2019, 05/17/2022   HIB (PRP-OMP) 07/04/2017   Influenza Split 05/29/2010, 05/18/2011, 05/15/2012   Influenza, High Dose Seasonal PF 04/26/2016, 05/06/2017, 07/04/2017, 05/03/2018   Influenza,inj,Quad PF,6+ Mos 07/06/2020   Influenza-Unspecified 05/29/2010, 05/18/2011, 05/15/2012, 05/29/2013, 05/28/2014, 05/01/2015, 06/29/2016, 05/29/2021   Meningococcal B, OMV 03/06/2023   Meningococcal Conjugate 11/07/2017, 01/02/2018   Meningococcal Mcv4o 03/06/2023   PFIZER Comirnaty(Gray Top)Covid-19 Tri-Sucrose Vaccine 01/04/2021   PFIZER(Purple Top)SARS-COV-2 Vaccination 09/19/2019, 10/10/2019, 06/02/2020   Pneumococcal Conjugate-13 04/26/2016, 07/04/2017   Pneumococcal Polysaccharide-23 03/30/2007, 11/07/2017, 03/06/2023   Pneumococcal-Unspecified 03/30/2007   Tdap 04/19/2008   Zoster, Live 08/29/2008    Health Maintenance  Topic Date Due   DTaP/Tdap/Td (2 -  Td or Tdap) 04/19/2018   COVID-19 Vaccine (5 - 2023-24 season) 05/31/2023 (Originally 04/30/2023)   Zoster Vaccines- Shingrix (1 of 2) 08/14/2023 (Originally 09/07/1959)   INFLUENZA VACCINE  11/27/2023 (Originally 03/30/2023)   DEXA SCAN  05/14/2024 (Originally 04/22/2023)   Medicare Annual Wellness (AWV)  05/14/2024   Pneumonia Vaccine 37+ Years old  Completed   HPV VACCINES  Aged Out       Assessment  This is a routine wellness examination for Mercedes Decker.  Health Maintenance: Due or Overdue Health Maintenance Due  Topic Date Due   DTaP/Tdap/Td (2 - Td or Tdap) 04/19/2018    Mercedes Decker does not need a referral for Community Assistance: Care Management:   no Social Work:    no Prescription Assistance:  no Nutrition/Diabetes Education:  no   Plan:  Personalized Goals  Goals Addressed               This Visit's Progress     Patient Stated (pt-stated)         Patient stated that she would like continue to be healthy active.       Personalized Health Maintenance & Screening Recommendations  Influenza vaccine Td vaccine Shingles vaccine Bone density scan  Lung Cancer Screening Recommended: no (Low Dose CT Chest recommended if Age 20-80 years, 20 pack-year currently smoking OR have quit w/in past 15 years) Hepatitis C Screening recommended: no HIV Screening recommended: no  Advanced Directives: Written information was not prepared per patient's request.  Referrals & Orders Orders Placed This Encounter  Procedures   DEXAScan    Follow-up Plan Follow-up with [name], NP as planned Schedule shingles vaccine and td vaccine at the pharmacy.  Influenza vaccine can be completed at the pharmacy or in office. Medicare wellness visit in one year.  Patient will access AVS on my chart.   I have personally reviewed and noted the following in the patient's chart:   Medical and social history Use of alcohol, tobacco or illicit drugs  Current medications and supplements Functional ability and status Nutritional status Physical activity Advanced directives List of other physicians Hospitalizations, surgeries, and ER visits in previous 12 months Vitals Screenings to include cognitive, depression, and falls Referrals and appointments  In addition, I have reviewed and discussed with Mercedes Decker certain preventive protocols, quality metrics, and best practice recommendations. A written personalized care plan for preventive services as well as general preventive health recommendations is available and can be mailed to the patient at her request.      Modesto Charon, RN BSN  05/15/2023

## 2023-05-17 ENCOUNTER — Encounter: Payer: Self-pay | Admitting: Licensed Clinical Social Worker

## 2023-05-23 DIAGNOSIS — M47816 Spondylosis without myelopathy or radiculopathy, lumbar region: Secondary | ICD-10-CM | POA: Diagnosis not present

## 2023-05-23 DIAGNOSIS — G629 Polyneuropathy, unspecified: Secondary | ICD-10-CM | POA: Diagnosis not present

## 2023-05-23 DIAGNOSIS — M4726 Other spondylosis with radiculopathy, lumbar region: Secondary | ICD-10-CM | POA: Diagnosis not present

## 2023-05-23 DIAGNOSIS — M48061 Spinal stenosis, lumbar region without neurogenic claudication: Secondary | ICD-10-CM | POA: Diagnosis not present

## 2023-05-23 DIAGNOSIS — M5136 Other intervertebral disc degeneration, lumbar region: Secondary | ICD-10-CM | POA: Diagnosis not present

## 2023-05-23 DIAGNOSIS — G894 Chronic pain syndrome: Secondary | ICD-10-CM | POA: Diagnosis not present

## 2023-05-24 DIAGNOSIS — I67848 Other cerebrovascular vasospasm and vasoconstriction: Secondary | ICD-10-CM | POA: Diagnosis not present

## 2023-05-24 DIAGNOSIS — I82C12 Acute embolism and thrombosis of left internal jugular vein: Secondary | ICD-10-CM | POA: Diagnosis not present

## 2023-05-24 DIAGNOSIS — M818 Other osteoporosis without current pathological fracture: Secondary | ICD-10-CM | POA: Diagnosis not present

## 2023-05-24 DIAGNOSIS — R9389 Abnormal findings on diagnostic imaging of other specified body structures: Secondary | ICD-10-CM | POA: Diagnosis not present

## 2023-05-24 DIAGNOSIS — I10 Essential (primary) hypertension: Secondary | ICD-10-CM | POA: Diagnosis not present

## 2023-05-24 DIAGNOSIS — E559 Vitamin D deficiency, unspecified: Secondary | ICD-10-CM | POA: Diagnosis not present

## 2023-05-25 DIAGNOSIS — I82C12 Acute embolism and thrombosis of left internal jugular vein: Secondary | ICD-10-CM | POA: Diagnosis not present

## 2023-05-25 DIAGNOSIS — J342 Deviated nasal septum: Secondary | ICD-10-CM | POA: Diagnosis not present

## 2023-05-29 DIAGNOSIS — I080 Rheumatic disorders of both mitral and aortic valves: Secondary | ICD-10-CM | POA: Diagnosis not present

## 2023-05-29 DIAGNOSIS — I82C12 Acute embolism and thrombosis of left internal jugular vein: Secondary | ICD-10-CM | POA: Diagnosis not present

## 2023-05-29 DIAGNOSIS — I67848 Other cerebrovascular vasospasm and vasoconstriction: Secondary | ICD-10-CM | POA: Diagnosis not present

## 2023-06-01 ENCOUNTER — Encounter: Payer: Self-pay | Admitting: Medical-Surgical

## 2023-06-01 ENCOUNTER — Ambulatory Visit (INDEPENDENT_AMBULATORY_CARE_PROVIDER_SITE_OTHER): Payer: Medicare Other | Admitting: Medical-Surgical

## 2023-06-01 VITALS — BP 156/73 | HR 62 | Resp 20 | Ht <= 58 in | Wt 97.4 lb

## 2023-06-01 DIAGNOSIS — R7303 Prediabetes: Secondary | ICD-10-CM | POA: Diagnosis not present

## 2023-06-01 DIAGNOSIS — D689 Coagulation defect, unspecified: Secondary | ICD-10-CM

## 2023-06-01 DIAGNOSIS — E782 Mixed hyperlipidemia: Secondary | ICD-10-CM

## 2023-06-01 DIAGNOSIS — I10 Essential (primary) hypertension: Secondary | ICD-10-CM | POA: Diagnosis not present

## 2023-06-01 MED ORDER — AMLODIPINE BESYLATE 5 MG PO TABS: ORAL_TABLET | ORAL | 3 refills | Status: DC

## 2023-06-01 NOTE — Progress Notes (Signed)
        Established patient visit  History, exam, impression, and plan:  1. Benign essential hypertension Pleasant 82 year old female presenting today with a history of hypertension.  She is currently taking amlodipine 5 mg daily and Toprol XL 50 mg daily.  Tolerating both medications well without side effects.  Occasionally checking blood pressures at home and reports that her readings have been high at times and lower at other times.  Notes her systolics range between 128 and 160s.  Following a low-sodium diet and exercising on a daily basis.  She does see cardiology who is only managing her Zetia but is not sure why they are not managing her blood pressure meds.  She has an upcoming appointment with cardiology on 06/20/2023 for follow-up.  Blood pressure is elevated today on initial check.  Recheck 156/73.  Recommend ambulatory monitoring daily over the next 2-3 weeks then plan to follow-up with me in 4 weeks.  Alternative is to follow-up with cardiology for medication management.  She will see what her cardiologist has to say but still plan to follow-up with me in 4 weeks to make sure everything is going well. - amLODipine (NORVASC) 5 MG tablet; TAKE 1 TABLET(5 MG) BY MOUTH DAILY  Dispense: 90 tablet; Refill: 3  2. Prediabetes History of prediabetes with no A1c checked in several years.  Checking today. - Hemoglobin A1c  3. Mixed hyperlipidemia She is currently managed by cardiology with Zetia 10 mg daily.  Has not had lipid panel checked in over 1 year.  Checking today. - Lipid panel  4. Clotting disorder (HCC) Managed by hematology.  Currently on Eliquis 5 mg twice daily.  Procedures performed this visit: None.  Return in about 4 weeks (around 06/29/2023) for HTN follow up.  __________________________________ Thayer Ohm, DNP, APRN, FNP-BC Primary Care and Sports Medicine [facility] [place]

## 2023-06-02 ENCOUNTER — Other Ambulatory Visit: Payer: Self-pay | Admitting: Medical-Surgical

## 2023-06-02 DIAGNOSIS — I10 Essential (primary) hypertension: Secondary | ICD-10-CM

## 2023-06-02 LAB — LIPID PANEL
Chol/HDL Ratio: 2.6 {ratio} (ref 0.0–4.4)
Cholesterol, Total: 232 mg/dL — ABNORMAL HIGH (ref 100–199)
HDL: 88 mg/dL (ref >39)
LDL Chol Calc (NIH): 129 mg/dL — ABNORMAL HIGH (ref 0–99)
Triglycerides: 87 mg/dL (ref 0–149)
VLDL Cholesterol Cal: 15 mg/dL (ref 5–40)

## 2023-06-02 LAB — HEMOGLOBIN A1C
Est. average glucose Bld gHb Est-mCnc: 117 mg/dL
Hgb A1c MFr Bld: 5.7 % — ABNORMAL HIGH (ref 4.8–5.6)

## 2023-06-06 ENCOUNTER — Encounter: Payer: Self-pay | Admitting: Licensed Clinical Social Worker

## 2023-06-09 DIAGNOSIS — L820 Inflamed seborrheic keratosis: Secondary | ICD-10-CM | POA: Diagnosis not present

## 2023-06-09 DIAGNOSIS — I10 Essential (primary) hypertension: Secondary | ICD-10-CM | POA: Diagnosis not present

## 2023-06-12 ENCOUNTER — Ambulatory Visit: Payer: Self-pay | Admitting: Licensed Clinical Social Worker

## 2023-06-12 NOTE — Patient Outreach (Signed)
Care Coordination   Initial Visit Note   06/12/2023 Name: Mercedes Decker MRN: [id number] DOB: Sep 22, 1940  Mercedes Decker is a 82 y.o. year old female who sees Mercedes Butter, NP for primary care. I spoke with  Mercedes Decker by phone today.  What matters to the patients health and wellness today?  Patient has stress in managing the care needs of her spouse.     Goals Addressed             This Visit's Progress    patient has stress in managing care needs of her spouse       Interventions Spoke with client about program support Discussed client needs with client Mercedes Decker said her spouse has dementia. She provides care for her spouse Discussed pain issues of client. Discussed sleeping issues of client. Discussed appetite of client Reviewed medication procurement. Provided counseling support Discussed mood issues. No mood issues noted. LCSW did speak with Mercedes Decker about her coping skills to manage stress. She enjoys walking for exercise Discussed client management of headaches. Discussed vision of client Discussed client support with NP Mercedes Decker Discussed insurance of client Discussed transport needs of client Thanked client for phone call with LCSW today Encouraged client to call LCSW at [phone] as needed for SW support        SDOH assessments and interventions completed:  Yes  SDOH Interventions Today    Flowsheet Row Most Recent Value  SDOH Interventions   Depression Interventions/Treatment  Counseling  Stress Interventions Provide Counseling  [stress in managing needs of her spouse]        Care Coordination Interventions:  Yes, provided   Interventions Today    Flowsheet Row Most Recent Value  Chronic Disease   Chronic disease during today's visit Other  [spoke with client about client needs]  General Interventions   General Interventions Discussed/Reviewed General Interventions Discussed, [employer]  [discussed program support]  Exercise  Interventions   Exercise Discussed/Reviewed Physical Activity  [client exercises,  client likes to walk for exercise]  Physical Activity Discussed/Reviewed Physical Activity Discussed  Education Interventions   Education Provided Provided Education  Provided Verbal Education On Community Resources  Mental Health Interventions   Mental Health Discussed/Reviewed Coping Strategies  [no mood issues noted. coping skills discussed]  Nutrition Interventions   Nutrition Discussed/Reviewed Nutrition Discussed  Pharmacy Interventions   Pharmacy Dicussed/Reviewed Pharmacy Topics Discussed       Follow up plan: Follow up call scheduled for 07/25/23 at 10:30 AM    Encounter Outcome:  Patient Visit Completed   Kelton Pillar.Aiden Rao MSW, LCSW Licensed Visual merchandiser [facility] Care Management [phone]

## 2023-06-12 NOTE — Patient Instructions (Signed)
Visit Information  Thank you for taking time to visit with me today. Please don't hesitate to contact me if I can be of assistance to you.   Following are the goals we discussed today:   Goals Addressed             This Visit's Progress    patient has stress in managing care needs of her spouse       Interventions Spoke with client about program support Discussed client needs with client Katera said her spouse has dementia. She provides care for her spouse Discussed pain issues of client. Discussed sleeping issues of client. Discussed appetite of client Reviewed medication procurement. Provided counseling support Discussed mood issues. No mood issues noted. LCSW did speak with Jaclynne about her coping skills to manage stress. She enjoys walking for exercise Discussed client management of headaches. Discussed vision of client Discussed client support with NP [name] Discussed insurance of client Discussed transport needs of client Thanked client for phone call with LCSW today Encouraged client to call LCSW at [phone] as needed for SW support        Our next appointment is by telephone on 07/25/23 at 10:30 AM   Please call the care guide team at [phone] if you need to cancel or reschedule your appointment.   If you are experiencing a Mental Health or Behavioral Health Crisis or need someone to talk to, please go to [facility] [address] [place] [phone])   The patient verbalized understanding of instructions, educational materials, and care plan provided today and DECLINED offer to receive copy of patient instructions, educational materials, and care plan.   The patient has been provided with contact information for the care management team and has been advised to call with any health related questions or concerns.   Kelton Pillar.Shaquila Sigman MSW, LCSW Licensed Visual merchandiser [facility] Care Management [phone]

## 2023-06-13 ENCOUNTER — Other Ambulatory Visit: Payer: Self-pay | Admitting: Student-PharmD

## 2023-06-20 DIAGNOSIS — I82C12 Acute embolism and thrombosis of left internal jugular vein: Secondary | ICD-10-CM | POA: Diagnosis not present

## 2023-06-20 DIAGNOSIS — R202 Paresthesia of skin: Secondary | ICD-10-CM | POA: Diagnosis not present

## 2023-06-20 DIAGNOSIS — E782 Mixed hyperlipidemia: Secondary | ICD-10-CM | POA: Diagnosis not present

## 2023-06-20 DIAGNOSIS — R011 Cardiac murmur, unspecified: Secondary | ICD-10-CM | POA: Diagnosis not present

## 2023-06-20 DIAGNOSIS — R200 Anesthesia of skin: Secondary | ICD-10-CM | POA: Diagnosis not present

## 2023-06-20 DIAGNOSIS — D689 Coagulation defect, unspecified: Secondary | ICD-10-CM | POA: Diagnosis not present

## 2023-06-20 DIAGNOSIS — I10 Essential (primary) hypertension: Secondary | ICD-10-CM | POA: Diagnosis not present

## 2023-06-26 DIAGNOSIS — R200 Anesthesia of skin: Secondary | ICD-10-CM | POA: Diagnosis not present

## 2023-06-26 DIAGNOSIS — R202 Paresthesia of skin: Secondary | ICD-10-CM | POA: Diagnosis not present

## 2023-06-29 ENCOUNTER — Ambulatory Visit (INDEPENDENT_AMBULATORY_CARE_PROVIDER_SITE_OTHER): Payer: Medicare Other | Admitting: Medical-Surgical

## 2023-06-29 VITALS — BP 126/88 | HR 64 | Resp 20 | Ht <= 58 in | Wt 97.0 lb

## 2023-06-29 DIAGNOSIS — Z636 Dependent relative needing care at home: Secondary | ICD-10-CM

## 2023-06-29 DIAGNOSIS — I10 Essential (primary) hypertension: Secondary | ICD-10-CM

## 2023-06-29 NOTE — Progress Notes (Signed)
        Established patient visit  History, exam, impression, and plan:  1. Benign essential hypertension With pleasant 82 year old female presenting today for follow-up on hypertension.  She is currently taking amlodipine 5 mg daily and Toprol-XL 50 mg daily.  Tolerating both medications well without side effects.  Checking blood pressures at home and reports that they have all been at or below goal.  She brought her blood pressure cuff with her today for verification of accuracy and it is about 9-10 points lower than ours however this is fairly accurate.  She does have new batteries in the machine.  Okay to continue using this cuff for management at home but expect that her actual readings are just a few points above her home cuff.  Denies any concerning symptoms today.  Cardiopulmonary exam normal.  Blood pressure on arrival 132/65.  Recheck at goal.  Continue amlodipine and Toprol-XL as prescribed.  2. Caregiver stress Is under considerable stress with her husband as his caretaker.  He is very cleaning and will not let her get out of his sight unless she is coming to the doctor's office.  She has been more stressed lately over the situation because she is going to [place] for a vacation and not taking him with her.  She has arranged for care for him while she is gone but does have some fears about him falling or taking medication inappropriately.  Feels that she is handling the situation as best as possible and has a good support system in place.  Has become aware of the need for self-care and is working on measures to make this happen.  Denies SI/HI.  No changes or interventions desired today.  Advised to reach out should she have any specific needs or if the stress becomes too much and we need to make any changes.  Patient verbalized understanding is agreeable to the plan.  Of note: Patient plans to get shingles vaccination and tetanus at her pharmacy and will let us know once this has been  done.   Procedures performed this visit: None.  Return in about 6 months (around 12/27/2023) for chronic disease follow up.  __________________________________ Thayer Ohm, DNP, APRN, FNP-BC Primary Care and Sports Medicine [facility] [place]

## 2023-07-24 DIAGNOSIS — H6123 Impacted cerumen, bilateral: Secondary | ICD-10-CM | POA: Diagnosis not present

## 2023-07-24 DIAGNOSIS — H73893 Other specified disorders of tympanic membrane, bilateral: Secondary | ICD-10-CM | POA: Diagnosis not present

## 2023-07-25 ENCOUNTER — Ambulatory Visit: Payer: Self-pay | Admitting: Licensed Clinical Social Worker

## 2023-07-25 NOTE — Patient Outreach (Signed)
  Care Coordination   Follow Up Visit Note   07/25/2023 Name: Mercedes Decker MRN: [id number] DOB: 1940-12-04  Mercedes Decker is a 82 y.o. year old female who sees [name], NP for primary care. I spoke with  Eric Form by phone today.  What matters to the patients health and wellness today? Patient has stress in managing the care needs of her spouse     Goals Addressed             This Visit's Progress    patient has stress in managing care needs of her spouse       Interventions Spoke with client via phone today about her current needs Client said she has stress in managing the care needs of her spouse. Her spouse has Dementia. Client said she has recently hired two persons to help with care needs of her spouse. She is now feeling a little better about managing needs of her spouse.  Discussed pain issues of client. Discussed sleeping issues of client. Discussed appetite of client Reviewed medication procurement of client Client did not mention any mood issues during call. She is feeling better now in that she has hired some additional help in caring for the needs of her spouse Client has previously reported that she likes to walk for exercise Discussed client support with NP [name] Discussed transport needs of client Discussed program support with RN, LCSW, Pharmacist Thanked client for phone call with LCSW today Encouraged client to call LCSW at [phone] as needed for SW support        SDOH assessments and interventions completed:  Yes  . SDOH Interventions Today    Flowsheet Row Most Recent Value  SDOH Interventions   Depression Interventions/Treatment  Counseling  Stress Interventions Other (Comment)  [client has stress related to managing the care needs of her spouse]        Care Coordination Interventions:  Yes, provided   Interventions Today    Flowsheet Row Most Recent Value  Chronic Disease   Chronic disease during today's visit  Other  [spoke with client about client needs]  General Interventions   General Interventions Discussed/Reviewed General Interventions Discussed, Community Resources  Education Interventions   Education Provided Provided Education  Provided Verbal Education On [employer]  Nutrition Interventions   Nutrition Discussed/Reviewed Nutrition Discussed  Pharmacy Interventions   Pharmacy Dicussed/Reviewed Pharmacy Topics Discussed      Follow up plan: Follow up call scheduled for 09/12/23 at 10:00 AM    Encounter Outcome:  Patient Visit Completed   Kelton Pillar.Mysty Kielty MSW, LCSW Licensed Visual merchandiser [facility] Care Management [phone]

## 2023-07-25 NOTE — Patient Instructions (Signed)
Visit Information  Thank you for taking time to visit with me today. Please don't hesitate to contact me if I can be of assistance to you.   Following are the goals we discussed today:   Goals Addressed             This Visit's Progress    patient has stress in managing care needs of her spouse       Interventions Spoke with client via phone today about her current needs Client said she has stress in managing the care needs of her spouse. Her spouse has Dementia. Client said she has recently hired two persons to help with care needs of her spouse. She is now feeling a little better about managing needs of her spouse.  Discussed pain issues of client. Discussed sleeping issues of client. Discussed appetite of client Reviewed medication procurement of client Client did not mention any mood issues during call. She is feeling better now in that she has hired some additional help in caring for the needs of her spouse Client has previously reported that she likes to walk for exercise Discussed client support with NP [name] Discussed transport needs of client Discussed program support with RN, LCSW, Pharmacist Thanked client for phone call with LCSW today Encouraged client to call LCSW at [phone] as needed for SW support        Our next appointment is by telephone on 09/12/23 at 10:00 AM   Please call the care guide team at [phone] if you need to cancel or reschedule your appointment.   If you are experiencing a Mental Health or Behavioral Health Crisis or need someone to talk to, please go to [facility] [address], [place] [phone])   The patient verbalized understanding of instructions, educational materials, and care plan provided today and DECLINED offer to receive copy of patient instructions, educational materials, and care plan.   The patient has been provided with contact information for the care management team  and has been advised to call with any health related questions or concerns.   Kelton Pillar.Regena Delucchi MSW, LCSW Licensed Visual merchandiser [facility] Care Management [phone]

## 2023-09-12 ENCOUNTER — Ambulatory Visit: Payer: Self-pay | Admitting: Licensed Clinical Social Worker

## 2023-09-12 NOTE — Patient Outreach (Signed)
  Care Coordination   Follow Up Visit Note   09/12/2023 Name: Mercedes Decker MRN: [id number] DOB: 22-Nov-1940  Mercedes Decker is a 83 y.o. year old female who sees Willo Mini, NP for primary care. I spoke with  Mercedes Decker by phone today.  What matters to the patients health and wellness today? Patient has stress in managing care needs of her spouse    Goals Addressed             This Visit's Progress    patient has stress in managing care needs of her spouse       Interventions Spoke with client via phone today about her current needs and status Client has some stress in managing care needs of her spouse.  Client said she has hired 2 persons to help her occasionally with managing care needs of her spouse Client and LCSW spoke of medication procurement for client Client drives her car as needed; thus, she has no transport needs  Discussed pain issues of client Client did not mention any mood issues for client at this time. She said she is feeling better about managing needs of her spouse since she has hired 2 persons to help with needs of her spouse. Client said she likes to walk for exercise. Discussed client support with Mini Willo NP Discussed program support with RN, LCSW, Pharmacist. Encouraged Mercedes to access program support as needed Thanked client for phone call with LCSW today Encouraged client to call LCSW at [phone] as needed for SW support Mercedes Decker was appreciative of call from LCSW today          SDOH assessments and interventions completed:  Yes  SDOH Interventions Today    Flowsheet Row Most Recent Value  SDOH Interventions   Depression Interventions/Treatment  Counseling  Stress Interventions Provide Counseling  [client has some stress in managing care needs of her spouse]        Care Coordination Interventions:  Yes, provided   Interventions Today    Flowsheet Row Most Recent Value  Chronic Disease   Chronic disease during today's  visit Other  [spoke with client about client needs]  General Interventions   General Interventions Discussed/Reviewed General Interventions Discussed, Community Resources  Education Interventions   Education Provided Provided Education  Provided Engineer, [employer] On [employer]  Mental Health Interventions   Mental Health Discussed/Reviewed Coping Strategies  [no mood issues noted]  Nutrition Interventions   Nutrition Discussed/Reviewed Nutrition Discussed  Pharmacy Interventions   Pharmacy Dicussed/Reviewed Pharmacy Topics Discussed  Safety Interventions   Safety Discussed/Reviewed Fall Risk        Follow up plan: Follow up call scheduled for 11/13/23 at 2:00 PM     Encounter Outcome:  Patient Visit Completed   Ozell RAMAN.Markale Birdsell MSW, LCSW Licensed Visual Merchandiser [facility] Care Management [phone]

## 2023-09-12 NOTE — Patient Instructions (Signed)
 Visit Information  Thank you for taking time to visit with me today. Please don't hesitate to contact me if I can be of assistance to you.   Following are the goals we discussed today:   Goals Addressed             This Visit's Progress    patient has stress in managing care needs of her spouse       Interventions Spoke with client via phone today about her current needs and status Client has some stress in managing care needs of her spouse.  Client said she has hired 2 persons to help her occasionally with managing care needs of her spouse Client and LCSW spoke of medication procurement for client Client drives her car as needed; thus, she has no transport needs  Discussed pain issues of client Client did not mention any mood issues for client at this time. She said she is feeling better about managing needs of her spouse since she has hired 2 persons to help with needs of her spouse. Client said she likes to walk for exercise. Discussed client support with Zada Palin NP Discussed program support with RN, LCSW, Pharmacist. Encouraged Nena to access program support as needed Thanked client for phone call with LCSW today Encouraged client to call LCSW at [phone] as needed for SW support Irlanda was appreciative of call from LCSW today          Our next appointment is by telephone on 11/13/23 at 2:00 PM   Please call the care guide team at [phone] if you need to cancel or reschedule your appointment.   If you are experiencing a Mental Health or Behavioral Health Crisis or need someone to talk to, please go to [facility] [address], [place] [phone])   The patient verbalized understanding of instructions, educational materials, and care plan provided today and DECLINED offer to receive copy of patient instructions, educational materials, and care plan.   The patient has been provided with contact information for the  care management team and has been advised to call with any health related questions or concerns.   Ozell RAMAN.Klinton Candelas MSW, LCSW Licensed Visual Merchandiser [facility] Care Management [phone]

## 2023-09-20 DIAGNOSIS — G629 Polyneuropathy, unspecified: Secondary | ICD-10-CM | POA: Diagnosis not present

## 2023-09-29 DIAGNOSIS — M818 Other osteoporosis without current pathological fracture: Secondary | ICD-10-CM | POA: Diagnosis not present

## 2023-09-29 DIAGNOSIS — H538 Other visual disturbances: Secondary | ICD-10-CM | POA: Diagnosis not present

## 2023-09-29 DIAGNOSIS — Z9081 Acquired absence of spleen: Secondary | ICD-10-CM | POA: Diagnosis not present

## 2023-09-29 DIAGNOSIS — E559 Vitamin D deficiency, unspecified: Secondary | ICD-10-CM | POA: Diagnosis not present

## 2023-09-29 DIAGNOSIS — I10 Essential (primary) hypertension: Secondary | ICD-10-CM | POA: Diagnosis not present

## 2023-10-02 DIAGNOSIS — R202 Paresthesia of skin: Secondary | ICD-10-CM | POA: Diagnosis not present

## 2023-10-02 DIAGNOSIS — G629 Polyneuropathy, unspecified: Secondary | ICD-10-CM | POA: Diagnosis not present

## 2023-10-02 DIAGNOSIS — R200 Anesthesia of skin: Secondary | ICD-10-CM | POA: Diagnosis not present

## 2023-10-10 DIAGNOSIS — R9082 White matter disease, unspecified: Secondary | ICD-10-CM | POA: Diagnosis not present

## 2023-10-10 DIAGNOSIS — I10 Essential (primary) hypertension: Secondary | ICD-10-CM | POA: Diagnosis not present

## 2023-10-10 DIAGNOSIS — H538 Other visual disturbances: Secondary | ICD-10-CM | POA: Diagnosis not present

## 2023-10-14 ENCOUNTER — Encounter: Payer: Self-pay | Admitting: *Deleted

## 2023-10-14 ENCOUNTER — Encounter (HOSPITAL_COMMUNITY): Payer: Self-pay

## 2023-10-14 ENCOUNTER — Emergency Department (HOSPITAL_COMMUNITY): Payer: Medicare Other

## 2023-10-14 ENCOUNTER — Other Ambulatory Visit: Payer: Self-pay

## 2023-10-14 ENCOUNTER — Emergency Department (HOSPITAL_COMMUNITY)
Admission: EM | Admit: 2023-10-14 | Discharge: 2023-10-14 | Disposition: A | Discharge status: Home / Self Care | Payer: Medicare Other | Attending: Emergency Medicine | Admitting: Emergency Medicine

## 2023-10-14 ENCOUNTER — Ambulatory Visit
Admission: EM | Admit: 2023-10-14 | Discharge: 2023-10-14 | Disposition: A | Discharge status: Home / Self Care | Payer: Medicare Other | Attending: Family Medicine | Admitting: Family Medicine

## 2023-10-14 DIAGNOSIS — M79605 Pain in left leg: Secondary | ICD-10-CM

## 2023-10-14 DIAGNOSIS — M79651 Pain in right thigh: Secondary | ICD-10-CM | POA: Insufficient documentation

## 2023-10-14 DIAGNOSIS — M79604 Pain in right leg: Secondary | ICD-10-CM

## 2023-10-14 DIAGNOSIS — M4726 Other spondylosis with radiculopathy, lumbar region: Secondary | ICD-10-CM

## 2023-10-14 DIAGNOSIS — M79652 Pain in left thigh: Secondary | ICD-10-CM | POA: Insufficient documentation

## 2023-10-14 DIAGNOSIS — G609 Hereditary and idiopathic neuropathy, unspecified: Secondary | ICD-10-CM

## 2023-10-14 DIAGNOSIS — M549 Dorsalgia, unspecified: Secondary | ICD-10-CM | POA: Insufficient documentation

## 2023-10-14 DIAGNOSIS — Z7901 Long term (current) use of anticoagulants: Secondary | ICD-10-CM | POA: Diagnosis not present

## 2023-10-14 HISTORY — DX: Acute embolism and thrombosis of unspecified vein: I82.90

## 2023-10-14 LAB — CBC
HCT: 37.8 % (ref 36.0–46.0)
Hemoglobin: 12.8 g/dL (ref 12.0–15.0)
MCH: 33.8 pg (ref 26.0–34.0)
MCHC: 33.9 g/dL (ref 30.0–36.0)
MCV: 99.7 fL (ref 80.0–100.0)
Platelets: 287 10*3/uL (ref 150–400)
RBC: 3.79 MIL/uL — ABNORMAL LOW (ref 3.87–5.11)
RDW: 13.2 % (ref 11.5–15.5)
WBC: 6.9 10*3/uL (ref 4.0–10.5)
nRBC: 0 % (ref 0.0–0.2)

## 2023-10-14 LAB — BASIC METABOLIC PANEL
Anion gap: 10 (ref 5–15)
BUN: 15 mg/dL (ref 8–23)
CO2: 26 mmol/L (ref 22–32)
Calcium: 9.3 mg/dL (ref 8.9–10.3)
Chloride: 104 mmol/L (ref 98–111)
Creatinine, Ser: 0.81 mg/dL (ref 0.44–1.00)
GFR, Estimated: >60 mL/min (ref >60)
Glucose, Bld: 116 mg/dL — ABNORMAL HIGH (ref 70–99)
Potassium: 4.3 mmol/L (ref 3.5–5.1)
Sodium: 140 mmol/L (ref 135–145)

## 2023-10-14 MED ORDER — METHYLPREDNISOLONE SODIUM SUCC 125 MG IJ SOLR
125.0000 mg | Freq: Every day | INTRAMUSCULAR | Status: DC
  Administered 2023-10-14: 125 mg via INTRAVENOUS
  Filled 2023-10-14: qty 2

## 2023-10-14 MED ORDER — ACETAMINOPHEN 500 MG PO TABS
1000.0000 mg | ORAL_TABLET | Freq: Once | ORAL | Status: AC
  Administered 2023-10-14: 1000 mg via ORAL
  Filled 2023-10-14: qty 2

## 2023-10-14 MED ORDER — LIDOCAINE 5 % EX PTCH
1.0000 | MEDICATED_PATCH | CUTANEOUS | Status: DC
  Administered 2023-10-14: 1 via TRANSDERMAL
  Filled 2023-10-14: qty 1

## 2023-10-14 MED ORDER — MORPHINE SULFATE (PF) 4 MG/ML IV SOLN
4.0000 mg | Freq: Once | INTRAVENOUS | Status: DC
  Filled 2023-10-14: qty 1

## 2023-10-14 MED ORDER — IOHEXOL 350 MG/ML SOLN
75.0000 mL | Freq: Once | INTRAVENOUS | Status: AC | PRN
  Administered 2023-10-14: 75 mL via INTRAVENOUS

## 2023-10-14 MED ORDER — ONDANSETRON HCL 4 MG/2ML IJ SOLN
4.0000 mg | Freq: Once | INTRAMUSCULAR | Status: DC
  Filled 2023-10-14: qty 2

## 2023-10-14 NOTE — ED Notes (Signed)
 Patient transported to CT

## 2023-10-14 NOTE — ED Triage Notes (Addendum)
C/O starting with significant pain from bilat buttocks radiating down to entire bilat legs onset last night. Denies any parasthesias. States unsure if she has leg swelling, "but they feel like they are". States has been wearing her compression stockings.

## 2023-10-14 NOTE — ED Provider Triage Note (Signed)
Emergency Medicine Provider Triage Evaluation Note  Mercedes Decker , a 83 y.o. female  was evaluated in triage.  Pt complains of bilat leg discomfort.    Review of Systems  Positive:  Negative: No fever or cough  Physical Exam  BP (!) 154/71 (BP Location: Left Arm)   Pulse 82   Temp 98 F (36.7 C)   Resp 18   SpO2 99%  Gen:   Awake, no distress   Resp:  Normal effort  MSK:   Moves extremities without difficulty  Other:    Medical Decision Making  Medically screening exam initiated at 1:36 PM.  Appropriate orders placed.  Mercedes Decker was informed that the remainder of the evaluation will be completed by another provider, this initial triage assessment does not replace that evaluation, and the importance of remaining in the ED until their evaluation is complete.     Mercedes Decker, [place] 10/14/23 1337

## 2023-10-14 NOTE — ED Triage Notes (Signed)
Pt c.o bilateral leg pain from her hips down to her feet since last night. Pt states she sat down to have a Bm this morning and the pain was worse. Pt has hx of blood clot in her neck in NOV and was unsure if it could be a blood clot. Pt taking Eliquis BID

## 2023-10-14 NOTE — Discharge Instructions (Signed)
Your leg pain requires testing that I am unable to do at the urgent care center.  You need to go to an emergency room

## 2023-10-14 NOTE — Discharge Instructions (Addendum)
Mercedes Decker:  Thank you for allowing [place] to take care of you today.  We hope you begin feeling better soon.  To-Do: Please follow-up with your spinal specialist ([facility]) at your earliest ability. Please return to the Emergency Department or call 911 if you experience chest pain, shortness of breath, severe pain, severe fever, altered mental status, or have any reason to think that you need emergency medical care.  Thank you again.  Hope you feel better soon.  Department of Emergency Medicine [facility]

## 2023-10-14 NOTE — ED Notes (Signed)
Pt placed into gown and on monitor. No needs at this time. Call light in reach. Warm blanket given.

## 2023-10-14 NOTE — ED Provider Notes (Signed)
[facility]    CSN: [id number] Arrival date & time: 10/14/23  1147      History   Chief Complaint Chief Complaint  Patient presents with   Leg Pain    HPI Mercedes Decker is a 83 y.o. female.   Patient is here complaining of pain that goes from her lower back and buttock region down both legs.  She states that the pain is severe.  She states it is constant and unremitting.  She states that it came on suddenly while she was asleep.  It does not follow any particular pattern i.e. front or back of legs.  Entire leg is painful.  She states that she has to be careful walking because she feels like she might fall.  She did not have any accident or injury.  No change in activity.  No heavy lifting.  No new medication. Patient states this feels a little bit like when she had her blood clot.  She states that she had a blood clot in her neck that caused her neck to become very painful all of a sudden.  I looked in her medical record and she did have a blood clot form in her jugular vein in November.  She remains on Eliquis. Patient also has a history of lumbar degenerative disc disease.  She has had lumbar epidural steroid injections for radiculopathy.  This feels different than her usual pinched nerve.  It feels different than her usual neuropathy.  She did try taking gabapentin for this pain and it did not help    Past Medical History:  Diagnosis Date   Arthritis    Blood clot in vein    Cataract Both removed   Clotting disorder (HCC)    Heart murmur    Hypertension    Migraine    Osteoporosis    Prediabetes 08/28/2018    Patient Active Problem List   Diagnosis Date Noted   Acute external jugular vein thrombosis 05/03/2023   Cataract 07/28/2021   Clotting disorder (HCC) 07/28/2021   DDD (degenerative disc disease) 02/09/2021   Hemorrhoids 02/09/2021   Irritable bladder 02/09/2021   RLS (restless legs syndrome) 02/09/2021   Cataract, nuclear sclerotic, left eye  04/12/2019   Neurogenic claudication due to lumbar spinal stenosis 03/04/2019   Prediabetes 08/28/2018   Primary osteoarthritis of both knees 11/21/2017   H/O splenectomy 10/24/2017   Greater trochanteric bursitis of right hip 10/23/2017   Pancreatic tumor 05/08/2017   Dry eye syndrome of both lacrimal glands 04/24/2017   Pseudophakia of right eye 04/24/2017   Hoarseness 02/10/2017   Right shoulder injury 08/18/2016   Vitamin D deficiency 07/08/2016   Right carpal tunnel syndrome 05/01/2016   TIA (transient ischemic attack) 03/08/2016   Primary osteoarthritis of first carpometacarpal joint of right hand 03/07/2016   Patient has active power of attorney for health care 11/30/2015   Murmur, cardiac 09/11/2012   Arthritis 09/03/2012   S/P hysterectomy 09/03/2012   Osteoporosis  L fem neck  Dexa 07/2014  -3.1, 03/2021 -3.6 09/03/2012   Migraine 09/03/2012   History of anemia 09/03/2012   Mixed hyperlipidemia 09/03/2012   GERD (gastroesophageal reflux disease) 09/03/2012   Benign essential hypertension 08/17/2011    Past Surgical History:  Procedure Laterality Date   ABDOMINAL HYSTERECTOMY     BACK SURGERY     BREAST SURGERY     BUNIONECTOMY     EYE SURGERY     HERNIA REPAIR     INCISIONAL HERNIA  REPAIR     KNEE ARTHROSCOPY     SPLENECTOMY, TOTAL  2019    OB History   No obstetric history on file.      Home Medications    Prior to Admission medications   Medication Sig Start Date End Date Taking? Authorizing Provider  amLODipine (NORVASC) 5 MG tablet TAKE 1 TABLET(5 MG) BY MOUTH DAILY 06/01/23  Yes [name], NP  apixaban (ELIQUIS) 5 MG TABS tablet Take 1 tablet (5 mg total) by mouth 2 (two) times daily. Start taking after completion of starter pack. 05/03/23  Yes Yates, Madison B, RPH-CPP  Ascorbic Acid (VITAMIN C) 125 MG CHEW Chew 125 mg by mouth daily.   Yes [provider]  Calcium Carbonate (CALCIUM 600 PO) Take 600 mg by mouth daily.   Yes [provider]  Cholecalciferol (VITAMIN D3) 25 MCG (1000 UT) CAPS Take 1,000 Units by mouth daily.   Yes [provider]  cyanocobalamin 100 MCG tablet Take 1 tablet by mouth daily.   Yes [provider]  ezetimibe (ZETIA) 10 MG tablet Take 1 tablet (10 mg total) by mouth daily. 08/05/21 10/14/23 Yes Baldo Daub, MD  ferrous sulfate 324 MG TBEC Take 324 mg by mouth daily with breakfast.   Yes [provider]  gabapentin (NEURONTIN) 100 MG capsule One tab PO qHS for a week, then BID for a week, then TID. 03/09/23  Yes Monica Becton, MD  Magnesium 250 MG TABS Take 1 tablet by mouth every morning.   Yes [provider]  metoprolol succinate (TOPROL-XL) 50 MG 24 hr tablet Take 1 tablet (50 mg total) by mouth daily. Take with or immediately following a meal. 03/09/23  Yes Monica Becton, MD  OVER THE COUNTER MEDICATION Take 500 mg by mouth daily. [place] - Mushroom type vitamin for the brain.   Yes [provider]  OVER THE COUNTER MEDICATION Total Beets - takes 1 gummy daily   Yes [provider]  Turmeric Curcumin 500 MG CAPS Take 1,500 mg by mouth daily.   Yes [provider]  valACYclovir (VALTREX) 500 MG tablet Take 500 mg by mouth daily.   Yes [provider]  vitamin E 180 MG (400 UNITS) capsule Take 400 Units by mouth daily.   Yes [provider]  acetaminophen (TYLENOL) 500 MG tablet Take 500 mg by mouth every 8 (eight) hours as needed (for pain).    [provider]    Family History Family History  Problem Relation Age of Onset   Alzheimer's disease Mother    Arthritis Mother    Heart disease Father    Hearing loss Father    Cancer Sister    Diabetes Sister    Heart disease Brother    Diabetes Brother    Cancer Brother    Arthritis Sister    Diabetes Sister    Arthritis Daughter    Hearing loss Brother    Hearing loss Sister    Heart disease Brother    Stroke Sister      Social History Social History   Tobacco Use   Smoking status: Never   Smokeless tobacco: Never   Tobacco comments:    none  Vaping Use   Vaping status: Never Used  Substance Use Topics   Alcohol use: Not Currently    Comment: rarely   Drug use: No     Allergies   Tape, Wound dressing adhesive, Cortisone, Meloxicam, Ace inhibitors, Atorvastatin,  Gabapentin, Losartan, and Vicodin [hydrocodone-acetaminophen]   Review of Systems Review of Systems See HPI  Physical Exam Triage Vital Signs ED Triage Vitals  Encounter Vitals Group     BP 10/14/23 1200 (!) 176/82     Systolic BP Percentile --      Diastolic BP Percentile --      Pulse Rate 10/14/23 1200 79     Resp 10/14/23 1200 20     Temp 10/14/23 1200 97.7 F (36.5 C)     Temp Source 10/14/23 1200 Oral     SpO2 10/14/23 1200 98 %     Weight --      Height --      Head Circumference --      Peak Flow --      Pain Score 10/14/23 1201 10     Pain Loc --      Pain Education --      Exclude from Growth Chart --    No data found.  Updated Vital Signs BP (!) 176/82   Pulse 79   Temp 97.7 F (36.5 C) (Oral)   Resp 20   SpO2 98%      Physical Exam Constitutional:      General: She is in acute distress.     Appearance: She is well-developed. She is ill-appearing.     Comments: Small elderly woman.  In acute distress.  In obvious discomfort.  HENT:     Head: Normocephalic and atraumatic.  Eyes:     Conjunctiva/sclera: Conjunctivae normal.     Pupils: Pupils are equal, round, and reactive to light.  Cardiovascular:     Rate and Rhythm: Normal rate.  Pulmonary:     Effort: Pulmonary effort is normal. No respiratory distress.  Musculoskeletal:        General: Normal range of motion.     Cervical back: Normal range of motion.     Comments: No tenderness over the back, SI region, or buttocks.  Straight leg raise is negative.  Reflexes are 2+ and equal at the knee and ankle.  The legs appear normal with no  swelling.  There is no sensory deficit to light touch.  No focal weakness.  Skin:    General: Skin is warm and dry.  Neurological:     General: No focal deficit present.     Mental Status: She is alert.     Sensory: No sensory deficit.     Motor: No weakness.     Gait: Gait normal.     Deep Tendon Reflexes: Reflexes normal.      UC Treatments / Results  Labs (all labs ordered are listed, but only abnormal results are displayed) Labs Reviewed - No data to display  EKG   Radiology No results found.  Procedures Procedures (including critical care time)  Medications Ordered in UC Medications - No data to display  Initial Impression / Assessment and Plan / UC Course  I have reviewed the triage vital signs and the nursing notes.  Pertinent labs & imaging results that were available during my care of the patient were reviewed by me and considered in my medical decision making (see chart for details).     I explained to the patient that she is in obvious discomfort and I am unable to do the testing she requires to evaluate this problem.  She is sent to the emergency room.  Patient voices understanding Final Clinical Impressions(s) / UC Diagnoses   Final diagnoses:  Bilateral  leg pain     Discharge Instructions      Your leg pain requires testing that I am unable to do at the urgent care center.  You need to go to an emergency room   ED Prescriptions   None    PDMP not reviewed this encounter.   Eustace Moore, MD 10/14/23 1247

## 2023-10-14 NOTE — ED Provider Notes (Signed)
 Assume Care Medical Decision Making  Patient care of assumed from previous provider at change of shift. Please see the original provider note from this emergency department encounter for full history and physical.   Briefly, Mercedes Decker is a 83 y.o. female who presents with:  Clinical Course as of 10/14/23 1953  Sat Oct 14, 2023  1507 CC: shooting pain down bilateral legs. Hx spinal stenosis and degenerative disease, osteoporosis. Hx of blood clots on Eliquis. No numbness, weakness or red flag symptoms. No injury/trauma or fall. Very likely bilateral sciatica. [ ]  reassess after pain medication [ ]  ambulate [RK]  1840 F/u CTA [RK]    Clinical Course User Index [RK] Caron Presume, MD     Please refer to the original provider's note for additional information regarding the care of MARYELLEN DOWDLE.  Labs reviewed by myself and considered in medical decision making.  Imaging reviewed by myself and considered in medical decision making. Imaging final read interpreted by radiology.  Additional MDM/ED Course: This is a well-appearing 83 year old patient with history of osteoporosis and spinal stenosis who is presenting for bilateral shooting pains down her legs that started last night around midnight. Denies any trauma to the area.  She also denies numbness, weakness down the legs. On my interview, patient adamantly denies any lower back pain and denies that she ever had back pain on her presentation. She did receive Solu-Medrol as well as Tylenol which completely resolved her pain. On my exam, she has no neurologic deficits or tenderness to palpation of the midline spine. She is ambulatory without issue and is able to walk tandem gait as well.  Patient with equal, strong motor in bilateral lower extremities and no fecal or urinary incontinence concerning for acute cord compression or cauda equina syndrome. Specifically, denies: -Being on an anticoagulant or blood  thinner -Using IV drugs -Having a history of AAA -Having saddle anesthesia -Having urinary or fecal incontinence -Having any recent falls or trauma  No trauma history or midline tenderness over spine concerning for vertebral fracture and no risk factors or systemic symptoms concerning for occult cancer, metastases. Patient afebrile, denying IVDA, and is immunocompetent, and no overlying skin rash, making epidural abscess unlikely. Based on history and physical, I have a very low suspicion of a concerning etiology of pain including transverse myelitis, malignancy, abdominal aortic aneurysm, renal colic, acute lower extremity claudication, neurogenic claudication, ankylosing spondylitis, or other intra-abdominal process.   Due to absence of concerning risk factors in history and physical as well as absence of rapidly progressive, severe, or bilateral symptoms, will defer imaging at this point.   Pain is adequately controlled here with SoluMedrol and Tylenol.  Patient did have small pulse differential on left DP compared to right. Will obtain CTA to evaluate aorta. Radiology interpretation: IMPRESSION:  VASCULAR    Scattered atherosclerotic changes are noted. No focal arterial  narrowing to correspond with the given clinical history is noted.    NON-VASCULAR    Postsurgical changes stable from the prior exam.    Degenerative changes of lumbar spine without significant canal  stenosis.    Given the patient's reassuring presentation, I believe that they are safe for discharge.  I provided ED return precautions, specifically for the symptoms which are most concerning (e.g., saddle anesthesia, urinary or bowel incontinence or retention, changing or worsening pain), which would necessitate immediate return.  I encouraged the patient to followup with their PCP.  Patient will follow up closely with her spinal  specialist.  Disposition:  I discussed the plan for discharge with the patient and/or  their surrogate at bedside prior to discharge and they were in agreement with the plan and verbalized understanding of the return precautions provided. All questions answered to the best of my ability. Ultimately, the patient was discharged in stable condition with stable vital signs. I am reassured that they are capable of close follow up and good social support at home.   Clinical Impression:  1. Bilateral leg pain     Rx / DC Orders ED Discharge Orders     None       The plan for this patient was discussed with Dr. Jearld Fenton, who voiced agreement and who oversaw evaluation and treatment of this patient.    Caron Presume, MD 10/14/23 Rushie Goltz    Loetta Rough, MD 10/21/23 [phone]

## 2023-10-14 NOTE — ED Notes (Signed)
Patient is being discharged from the Urgent Care and sent to the Emergency Department via private vehicle . Per Dr Delton See, MD, patient is in need of higher level of care due to severe BLE pain. Patient is aware and verbalizes understanding of plan of care.  Vitals:   10/14/23 1200  BP: (!) 176/82  Pulse: 79  Resp: 20  Temp: 97.7 F (36.5 C)  SpO2: 98%

## 2023-10-14 NOTE — ED Provider Notes (Signed)
[place] EMERGENCY DEPARTMENT AT [facility] Provider Note   CSN: [id number] Arrival date & time: 10/14/23  1306     History  Chief Complaint  Patient presents with   Leg Pain    Mercedes Decker is a 83 y.o. female with osteoporosis, hyperlipidemia, degenerative disc disease, neurogenic claudication of lumbar spinal stenosis, blood clot on Eliquis presenting to emergency room with bilateral posterior lower extremity pain.  Patient reports that the pain started yesterday and is gradually worsening.  Reports it is a burning or sharp stabbing sensation down both legs.  Pain is constant however severity is intermittent.  Patient reports the left leg is worse than the right leg.  She reports she is able to ambulate but with significant discomfort.  She was sent here from urgent care due to severity of symptoms.  Patient denies any saddle anesthesia.  Denies any loss of bowel or bladder.  Denies any numbness tingling or weakness.   Leg Pain      Home Medications Prior to Admission medications   Medication Sig Start Date End Date Taking? Authorizing Provider  acetaminophen (TYLENOL) 500 MG tablet Take 500 mg by mouth every 8 (eight) hours as needed (for pain).    [provider]  amLODipine (NORVASC) 5 MG tablet TAKE 1 TABLET(5 MG) BY MOUTH DAILY 06/01/23   [name], NP  apixaban (ELIQUIS) 5 MG TABS tablet Take 1 tablet (5 mg total) by mouth 2 (two) times daily. Start taking after completion of starter pack. 05/03/23   Pervis Hocking B, RPH-CPP  Ascorbic Acid (VITAMIN C) 125 MG CHEW Chew 125 mg by mouth daily.    [provider]  Calcium Carbonate (CALCIUM 600 PO) Take 600 mg by mouth daily.    [provider]  Cholecalciferol (VITAMIN D3) 25 MCG (1000 UT) CAPS Take 1,000 Units by mouth daily.    [provider]  cyanocobalamin 100 MCG tablet Take 1 tablet by mouth daily.    [provider]  ezetimibe (ZETIA) 10 MG tablet Take 1  tablet (10 mg total) by mouth daily. 08/05/21 10/14/23  Baldo Daub, MD  ferrous sulfate 324 MG TBEC Take 324 mg by mouth daily with breakfast.    [provider]  gabapentin (NEURONTIN) 100 MG capsule One tab PO qHS for a week, then BID for a week, then TID. 03/09/23   Monica Becton, MD  Magnesium 250 MG TABS Take 1 tablet by mouth every morning.    [provider]  metoprolol succinate (TOPROL-XL) 50 MG 24 hr tablet Take 1 tablet (50 mg total) by mouth daily. Take with or immediately following a meal. 03/09/23   Monica Becton, MD  OVER THE COUNTER MEDICATION Take 500 mg by mouth daily. [place] - Mushroom type vitamin for the brain.    [provider]  OVER THE COUNTER MEDICATION Total Beets - takes 1 gummy daily    [provider]  Turmeric Curcumin 500 MG CAPS Take 1,500 mg by mouth daily.    [provider]  valACYclovir (VALTREX) 500 MG tablet Take 500 mg by mouth daily.    [provider]  vitamin E 180 MG (400 UNITS) capsule Take 400 Units by mouth daily.    [provider]      Allergies    Tape, Wound dressing adhesive, Cortisone, Meloxicam, Ace inhibitors, Atorvastatin, Gabapentin, Losartan, and Vicodin [hydrocodone-acetaminophen]    Review of Systems   Review of Systems  Musculoskeletal:  Positive for arthralgias.    Physical Exam Updated Vital Signs BP 136/64   Pulse 71   Temp 98 F (36.7 C)   Resp 20   SpO2 100%  Physical Exam Vitals and nursing note reviewed.  Constitutional:      General: She is not in acute distress.    Appearance: She is not toxic-appearing.  HENT:     Head: Normocephalic and atraumatic.  Eyes:     General: No scleral icterus.    Conjunctiva/sclera: Conjunctivae normal.  Cardiovascular:     Rate and Rhythm: Normal rate and regular rhythm.     Pulses: Normal pulses.     Heart sounds: Normal heart sounds.  Pulmonary:     Effort: Pulmonary effort is normal. No  respiratory distress.     Breath sounds: Normal breath sounds.  Abdominal:     General: Abdomen is flat. Bowel sounds are normal.     Palpations: Abdomen is soft.     Tenderness: There is no abdominal tenderness.  Musculoskeletal:     Right lower leg: No edema.     Left lower leg: No edema.     Comments: Patient has no tenderness to palpation over lumbar spine, no tenderness to palpation over low back paraspinal musculature as well.  Patient is neurovascularly intact.  With strong dorsal pedal pulses equal bilaterally.  Good cap refill. Lower extremity strength is equal bilaterally.  No pattern of numbness or change in sensation.  She is locating sharp shooting pain down posterior aspect of thighs into lateral calf.  Skin:    General: Skin is warm and dry.     Findings: No lesion.  Neurological:     General: No focal deficit present.     Mental Status: She is alert and oriented to person, place, and time. Mental status is at baseline.     ED Results / Procedures / Treatments   Labs (all labs ordered are listed, but only abnormal results are displayed) Labs Reviewed  BASIC METABOLIC PANEL - Abnormal; Notable for the following components:      Result Value   Glucose, Bld 116 (*)    All other components within normal limits  CBC - Abnormal; Notable for the following components:   RBC 3.79 (*)    All other components within normal limits    EKG None  Radiology No results found.  Procedures Procedures    Medications Ordered in ED Medications  lidocaine (LIDODERM) 5 % 1 patch (has no administration in time range)  acetaminophen (TYLENOL) tablet 1,000 mg (has no administration in time range)  methylPREDNISolone sodium succinate (SOLU-MEDROL) 125 mg/2 mL injection 125 mg (has no administration in time range)    ED Course/ Medical Decision Making/ A&P Clinical Course as of 10/14/23 1659  Sat Oct 14, 2023  1507 CC: shooting pain down bilateral legs. Hx spinal stenosis and  degenerative disease, osteoporosis. Hx of blood clots on Eliquis. No numbness, weakness or red flag symptoms. No injury/trauma or fall. Very likely bilateral sciatica. [ ]  reassess after pain medication [ ]  ambulate [RK]    Clinical Course User Index [RK] Caron Presume, MD                                 Medical Decision Making Amount and/or Complexity of Data Reviewed Labs: ordered.  Risk OTC drugs. Prescription drug management.   TASHI ANDUJO 83 y.o. presented today for  back pain. Working Ddx: MSK in nature, fracture, epidural hematoma/abscess, cauda equina syndrome, spinal stenosis, spinal malignancy, discitis, spinal infection, spondylitises/ spondylosis, conus medullaris, DDD of the back.  R/o DDx: Cauda equina syndrome and additional dx are less likely than current impression due to history of present illness, physical exam, labs/imaging findings. No focal neurological deficits, no loss of bowel or bladder control.  Denies fever, night sweats, weight loss, h/o cancer, IVDU.  PMHX: Lumbar spinal stenosis, osteoarthritis, osteoporosis, restless leg syndrome    Review of prior external notes: UC visit 2/15  Labs: Obtain CBC and BMP in case patient is requiring admission versus further imaging  Imaging: Will hold off on imaging at this time.  Will try to control patient's pain however I feel that if her pain is not well-controlled secondary to neurologic symptoms and may be beneficial for emergent MRI.  Problem List / ED Course / Critical interventions / Medication management  Presenting to emergency room with bilateral sacral pain radiating to both extremities.  She is neurovascularly intact.  Describes pain as sharp and shooting which I feel is likely related to sciatica, has history of spinal stenosis of lumbar spine documented in chart. Patient has had recent EMG for neuropathy and has diagnosis of restless leg, patient reports the symptoms are different than what she  normally has.  I will try to control patient's pain in emergency room and re-assess, may benefit from emergent MRI if unable to ambulate/ can't control symptoms given age and severity.  She has no red flag symptoms of back pain at this time. I ordered medication including Tylenol, lidocaine patch, steroids Reevaluation of the patient after these medicines showed that the patient improved Patients vitals assessed. Upon arrival patient is hemodynamically stable.  I have reviewed the patients home medicines and have made adjustments as needed  Consult: None    Plan:  Signed of to oncoming provider.  Dispo pending re-assessment and ambulation.          Final Clinical Impression(s) / ED Diagnoses Final diagnoses:  Bilateral leg pain    Rx / DC Orders ED Discharge Orders     None         Smitty Knudsen, PA-C 10/14/23 1701    Wynetta Fines, MD 10/15/23 [phone]

## 2023-10-17 ENCOUNTER — Ambulatory Visit: Payer: Self-pay | Admitting: Licensed Clinical Social Worker

## 2023-10-17 NOTE — Patient Outreach (Signed)
  Care Coordination   Follow Up Visit Note   10/17/2023 Name: [name] MRN: [id number] DOB: [birth year]  Mercedes Decker is a 83 y.o. year old female who sees [name], NP for primary care. I spoke with  Eric Form by phone today.  What matters to the patients health and wellness today?  Patient has stress in managing care needs of her spouse    Goals Addressed             This Visit's Progress    patient has stress in managing care needs of her spouse       Interventions Spoke with client via phone today about her current needs and status Client has some stress in managing care needs of her spouse.  Client said she has hired 2 persons to help her occasionally with managing care needs of her spouse. Client said she can call these 2 persons as needed to help with care needs of her spouse  Client and LCSW spoke of medication procurement for client Client drives her car as needed; thus, she has no transport needs  Discussed pain issues of client. Client said she is managing pain issues faced Client did not mention any mood issues for client at this time. Client said she likes to walk for exercise. Discussed client support with [name] NP Provided counseling support for client Discussed program support with RN, LCSW, Pharmacist. Encouraged Dois Davenport to access program support as needed Thanked client for phone call with LCSW today Encouraged client to call LCSW at [phone] as needed for SW support Jalon was appreciative of call from LCSW today          SDOH assessments and interventions completed:  Yes  SDOH Interventions Today    Flowsheet Row Most Recent Value  SDOH Interventions   Depression Interventions/Treatment  Counseling  Stress Interventions Provide Counseling  [has stress in managing the care needs of her spouse]        Care Coordination Interventions:  Yes, provided   Interventions Today    Flowsheet Row Most Recent Value  Chronic  Disease   Chronic disease during today's visit Other  [spoke with client about client needs]  General Interventions   General Interventions Discussed/Reviewed General Interventions Discussed, Community Resources  Education Interventions   Education Provided Provided Education  Provided Engineer, [employer] On [employer]  Mental Health Interventions   Mental Health Discussed/Reviewed Coping Strategies  [no mood issues noted]  Nutrition Interventions   Nutrition Discussed/Reviewed Nutrition Discussed  Pharmacy Interventions   Pharmacy Dicussed/Reviewed Pharmacy Topics Discussed  Safety Interventions   Safety Discussed/Reviewed Fall Risk        Follow up plan: Follow up call scheduled for 12/19/23 at 10:00 AM    Encounter Outcome:  Patient Visit Completed    Lorna Few  MSW, LCSW [place] Licensed Clinical Social Worker Direct Dial:  [phone] Fax:  [phone] Website:  [email or web address]

## 2023-10-17 NOTE — Patient Instructions (Signed)
Visit Information  Thank you for taking time to visit with me today. Please don't hesitate to contact me if I can be of assistance to you.   Following are the goals we discussed today:   Goals Addressed             This Visit's Progress    patient has stress in managing care needs of her spouse       Interventions Spoke with client via phone today about her current needs and status Client has some stress in managing care needs of her spouse.  Client said she has hired 2 persons to help her occasionally with managing care needs of her spouse. Client said she can call these 2 persons as needed to help with care needs of her spouse  Client and LCSW spoke of medication procurement for client Client drives her car as needed; thus, she has no transport needs  Discussed pain issues of client. Client said she is managing pain issues faced Client did not mention any mood issues for client at this time. Client said she likes to walk for exercise. Discussed client support with [name] NP Provided counseling support for client Discussed program support with RN, LCSW, Pharmacist. Encouraged Dois Davenport to access program support as needed Thanked client for phone call with LCSW today Encouraged client to call LCSW at [phone] as needed for SW support Sailor was appreciative of call from LCSW today          Our next appointment is by telephone on 12/19/23 at 10:00 AM   Please call the care guide team at [phone] if you need to cancel or reschedule your appointment.   If you are experiencing a Mental Health or Behavioral Health Crisis or need someone to talk to, please go to [facility] [address] [place] [phone])   The patient verbalized understanding of instructions, educational materials, and care plan provided today and DECLINED offer to receive copy of patient instructions, educational materials, and care plan.   The patient has  been provided with contact information for the care management team and has been advised to call with any health related questions or concerns.    Lorna Few  MSW, LCSW [place] Licensed Clinical Social Worker Direct Dial:  [phone] Fax:  [phone] Website:  [email or web address]

## 2023-10-19 DIAGNOSIS — G629 Polyneuropathy, unspecified: Secondary | ICD-10-CM | POA: Diagnosis not present

## 2023-10-19 DIAGNOSIS — G894 Chronic pain syndrome: Secondary | ICD-10-CM | POA: Diagnosis not present

## 2023-10-19 DIAGNOSIS — M47816 Spondylosis without myelopathy or radiculopathy, lumbar region: Secondary | ICD-10-CM | POA: Diagnosis not present

## 2023-10-19 DIAGNOSIS — M48061 Spinal stenosis, lumbar region without neurogenic claudication: Secondary | ICD-10-CM | POA: Diagnosis not present

## 2023-10-19 DIAGNOSIS — M51361 Other intervertebral disc degeneration, lumbar region with lower extremity pain only: Secondary | ICD-10-CM | POA: Diagnosis not present

## 2023-10-19 DIAGNOSIS — M4726 Other spondylosis with radiculopathy, lumbar region: Secondary | ICD-10-CM | POA: Diagnosis not present

## 2023-11-03 DIAGNOSIS — M4807 Spinal stenosis, lumbosacral region: Secondary | ICD-10-CM | POA: Diagnosis not present

## 2023-11-03 DIAGNOSIS — M4726 Other spondylosis with radiculopathy, lumbar region: Secondary | ICD-10-CM | POA: Diagnosis not present

## 2023-11-03 DIAGNOSIS — M48061 Spinal stenosis, lumbar region without neurogenic claudication: Secondary | ICD-10-CM | POA: Diagnosis not present

## 2023-11-13 ENCOUNTER — Ambulatory Visit: Payer: Self-pay | Admitting: Licensed Clinical Social Worker

## 2023-11-13 NOTE — Patient Outreach (Signed)
 Care Coordination   11/13/2023 Name: Mercedes Decker MRN: [id number] DOB: 1940/09/10   Care Coordination Outreach Attempts:  An unsuccessful telephone outreach was attempted today to offer the patient information about available complex care management services.  Follow Up Plan:  Additional outreach attempts will be made to offer the patient complex care management information and services.   Encounter Outcome:  No Answer   Care Coordination Interventions:  No, not indicated     Lorna Few  MSW, LCSW [place] Licensed Clinical Social Worker Direct Dial:  [phone] Fax:  [phone] Website:  [email or web address]

## 2023-11-16 DIAGNOSIS — M51361 Other intervertebral disc degeneration, lumbar region with lower extremity pain only: Secondary | ICD-10-CM | POA: Diagnosis not present

## 2023-11-16 DIAGNOSIS — G894 Chronic pain syndrome: Secondary | ICD-10-CM | POA: Diagnosis not present

## 2023-11-16 DIAGNOSIS — M47816 Spondylosis without myelopathy or radiculopathy, lumbar region: Secondary | ICD-10-CM | POA: Diagnosis not present

## 2023-11-16 DIAGNOSIS — M4726 Other spondylosis with radiculopathy, lumbar region: Secondary | ICD-10-CM | POA: Diagnosis not present

## 2023-11-16 DIAGNOSIS — M48061 Spinal stenosis, lumbar region without neurogenic claudication: Secondary | ICD-10-CM | POA: Diagnosis not present

## 2023-11-20 DIAGNOSIS — L821 Other seborrheic keratosis: Secondary | ICD-10-CM | POA: Diagnosis not present

## 2023-11-20 DIAGNOSIS — I10 Essential (primary) hypertension: Secondary | ICD-10-CM | POA: Diagnosis not present

## 2023-11-20 DIAGNOSIS — L814 Other melanin hyperpigmentation: Secondary | ICD-10-CM | POA: Diagnosis not present

## 2023-11-20 DIAGNOSIS — D229 Melanocytic nevi, unspecified: Secondary | ICD-10-CM | POA: Diagnosis not present

## 2023-11-20 DIAGNOSIS — Z872 Personal history of diseases of the skin and subcutaneous tissue: Secondary | ICD-10-CM | POA: Diagnosis not present

## 2023-11-20 DIAGNOSIS — D492 Neoplasm of unspecified behavior of bone, soft tissue, and skin: Secondary | ICD-10-CM | POA: Diagnosis not present

## 2023-11-20 DIAGNOSIS — D0439 Carcinoma in situ of skin of other parts of face: Secondary | ICD-10-CM | POA: Diagnosis not present

## 2023-12-12 DIAGNOSIS — D0439 Carcinoma in situ of skin of other parts of face: Secondary | ICD-10-CM | POA: Diagnosis not present

## 2023-12-19 ENCOUNTER — Ambulatory Visit: Payer: Medicare Other | Admitting: Licensed Clinical Social Worker

## 2023-12-19 NOTE — Patient Outreach (Signed)
 Complex Care Management   Visit Note  12/19/2023  Name:  Mercedes Decker MRN: [id number] DOB: 1941-08-01  Situation: Referral received for Complex Care Management related to  client management of caregiver stress issues  I obtained verbal consent from Patient.  Visit completed with patient  on the phone  Background:   Past Medical History:  Diagnosis Date   Arthritis    Blood clot in vein    Cataract Both removed   Clotting disorder (HCC)    Heart murmur    Hypertension    Migraine    Osteoporosis    Prediabetes 08/28/2018    Assessment: Patient Reported Symptoms:  Cognitive    No issues noted    Neurological    No issues noted  HEENT    No issues noted    Cardiovascular  Anemia  Weight: 100 lb (45.4 kg)  Respiratory    No issues noted  Endocrine    B12 deficiency  Gastrointestinal    GERD    Genitourinary  Irritable bladder    Integumentary    Unable to assess  Musculoskeletal    Some occasional pain issues; RLS      Psychosocial    Caregiver stress issues; fatigues occasionally   Quality of Family Relationships: supportive Do you feel physically threatened by others?: No      12/19/2023   12:51 PM  Depression screen PHQ 2/9  Decreased Interest 1  Down, Depressed, Hopeless 1  PHQ - 2 Score 2  Altered sleeping 0  Tired, decreased energy 1  Change in appetite 0  Feeling bad or failure about yourself  1  Trouble concentrating 0  Moving slowly or fidgety/restless 1  Suicidal thoughts 0  PHQ-9 Score 5  Difficult doing work/chores Somewhat difficult    Vitals:   Within normal range, per client Medications Reviewed Today     Reviewed by Afton Horse (Social Worker) on 12/19/23 at 1250  Med List Status: <None>   Medication Order Taking? Sig Documenting Provider Last Dose Status Informant  acetaminophen  (TYLENOL ) 500 MG tablet [id number] No Take 500 mg by mouth every 8 (eight) hours as needed (for pain). [provider]  Taking Active   amLODipine  (NORVASC ) 5 MG tablet [id number] No TAKE 1 TABLET(5 MG) BY MOUTH DAILY Cherre Cornish, NP Taking Active   apixaban  (ELIQUIS ) 5 MG TABS tablet [id number] No Take 1 tablet (5 mg total) by mouth 2 (two) times daily. Start taking after completion of starter pack. Faye Hoops B, RPH-CPP Taking Active   Ascorbic Acid (VITAMIN C) 125 MG CHEW [id number] No Chew 125 mg by mouth daily. [provider] Taking Active   Calcium  Carbonate (CALCIUM  600 PO) [id number] No Take 600 mg by mouth daily. [provider] Taking Active   Cholecalciferol (VITAMIN D3) 25 MCG (1000 UT) CAPS [id number] No Take 1,000 Units by mouth daily. [provider] Taking Active   cyanocobalamin  100 MCG tablet [id number] No Take 1 tablet by mouth daily. [provider] Taking Active   ezetimibe  (ZETIA ) 10 MG tablet [id number] No Take 1 tablet (10 mg total) by mouth daily. Hassan Links, MD Taking Expired 10/14/23 2359   ferrous sulfate 324 MG TBEC [id number] No Take 324 mg by mouth daily with breakfast. [provider] Taking Active   gabapentin  (NEURONTIN ) 100 MG capsule [id number] No One tab PO qHS for a week, then BID for a week, then TID. Gean Keels, MD Taking Active   Magnesium 250 MG  TABS [id number] No Take 1 tablet by mouth every morning. [provider] Taking Active   metoprolol  succinate (TOPROL -XL) 50 MG 24 hr tablet [id number] No Take 1 tablet (50 mg total) by mouth daily. Take with or immediately following a meal. Gean Keels, MD Taking Active   OVER THE COUNTER MEDICATION [id number] No Take 500 mg by mouth daily. Lion Maine  - Mushroom type vitamin for the brain. [provider] Taking Active   OVER THE COUNTER MEDICATION [id number] No Total Beets - takes 1 gummy daily [provider] Taking Active   Turmeric Curcumin 500 MG CAPS [id number] No Take 1,500 mg by mouth daily. [provider] Taking Active    valACYclovir (VALTREX) 500 MG tablet [id number] No Take 500 mg by mouth daily. [provider] Taking Active   vitamin E 180 MG (400 UNITS) capsule [id number] No Take 400 Units by mouth daily. [provider] Taking Active             Recommendation:   Client to attend scheduled medical appointments and to take medications as prescribed Client to allow time for self care Client to call Cherre Cornish NP as needed for medical support Client to contact LCSW as needed for SW support Client to utilize in home help as needed to help with needs of her spouse.   Follow Up Plan:   Client to call LCSW as needed for SW support at [phone]   Alexandria Angel  MSW, LCSW [place]/[facility] Licensed Clinical Social Worker Direct Dial:  [phone] Fax:  [phone] Website:  [email or web address]

## 2023-12-19 NOTE — Patient Instructions (Signed)
 Visit Information  Thank you for taking time to visit with me today. Please don't hesitate to contact me if I can be of assistance to you before our next scheduled appointment.  Client to call LCSW as needed for SW support at [phone]  Please call the care guide team at [phone] if you need to cancel or reschedule your appointment.   Following is a copy of your care plan:   Goals Addressed             This Visit's Progress    VBCI Social Work Care Plan       Problems:   Stress in managing some daily needs              Has to allow time for ADLs completion              Some pain issues             Stress in managing care needs of her spouse.               CSW Clinical Goal(s):   Over the next 30 days the Patient will attend all scheduled medical appointments as evidenced by patient report and care team review of appointment completion in electronic MEDICAL RECORD NUMBER  .               Over the next 30 days, patient will cope will anxiety and stress issues by use of coping skills of choice AEB patient reduction in anxiety or stress issues.   Interventions:  Talked with Mercedes Decker about needs of her husband              Discussed client support from Joy Jessup NP             Discussed medication procurement of client             Discussed pain issues of client; discussed sleeping of client; discussed transport needs of client . She drives herself as needed             Reviewed mood of client. Overall she feels that her mood is stable. She does sometimes have some caregiver stress issues faced              Discussed energy level of client. She gets fatigued sometimes              Client likes to walk for exercise               Encouraged client to call LCSW as needed for SW support                                        Patient Goals/Self-Care Activities:  Patient to attend scheduled medical appointments             Patient to take medications as prescribed              Patient to call LCSW as needed for SW support            Patient to allow time for self care (rest, relaxation, eating meals as scheduled, doing hobbies or activities of choice)            Call Cherre Cornish NP as needed for medical support  Plan:   Client has LCSW name and phone number and can call LCSW as needed for SW  support at [phone]         Please go to [facility] [address], [place] [phone]) if you are experiencing a Mental Health or Behavioral Health Crisis or need someone to talk to.  The patient verbalized understanding of instructions, educational materials, and care plan provided today and DECLINED offer to receive copy of patient instructions, educational materials, and care plan.   Patient was given contact information for her to contact Care Team as needed. LCSW provided client with LCSW name and phone number for client to contact for SW needs   Alexandria Angel  MSW, LCSW [place] Licensed Clinical Social Worker Direct Dial:  [phone] Fax:  [phone] Website:  [email or web address]

## 2023-12-22 ENCOUNTER — Encounter: Payer: Self-pay | Admitting: Medical-Surgical

## 2023-12-22 ENCOUNTER — Ambulatory Visit (INDEPENDENT_AMBULATORY_CARE_PROVIDER_SITE_OTHER): Payer: Medicare Other | Admitting: Medical-Surgical

## 2023-12-22 VITALS — BP 121/57 | HR 62 | Resp 20 | Ht <= 58 in | Wt 103.0 lb

## 2023-12-22 DIAGNOSIS — Z636 Dependent relative needing care at home: Secondary | ICD-10-CM

## 2023-12-22 DIAGNOSIS — I82C12 Acute embolism and thrombosis of left internal jugular vein: Secondary | ICD-10-CM | POA: Diagnosis not present

## 2023-12-22 DIAGNOSIS — E559 Vitamin D deficiency, unspecified: Secondary | ICD-10-CM | POA: Diagnosis not present

## 2023-12-22 DIAGNOSIS — I10 Essential (primary) hypertension: Secondary | ICD-10-CM

## 2023-12-22 DIAGNOSIS — D689 Coagulation defect, unspecified: Secondary | ICD-10-CM

## 2023-12-22 DIAGNOSIS — R5383 Other fatigue: Secondary | ICD-10-CM

## 2023-12-22 DIAGNOSIS — R718 Other abnormality of red blood cells: Secondary | ICD-10-CM | POA: Diagnosis not present

## 2023-12-22 DIAGNOSIS — R7303 Prediabetes: Secondary | ICD-10-CM | POA: Diagnosis not present

## 2023-12-22 LAB — POCT GLYCOSYLATED HEMOGLOBIN (HGB A1C): Hemoglobin A1C: 5.4 % (ref 4.0–5.6)

## 2023-12-22 NOTE — Progress Notes (Signed)
        Established patient visit  History, exam, impression, and plan:  1. Prediabetes (Primary) Pleasant 83 year old female presenting today with a history of prediabetes.  She has been working on dietary modifications and getting more exercise.  Likes to walk for exercise and does this daily.  Does not check sugars at home.  Last hemoglobin A1c at 5.7% 6 months ago.  Recheck today at 5.4%.  Continue to work on dietary modifications and regular intentional exercise. - POCT HgB A1C  2. Benign essential hypertension History of hypertension and is currently treated with amlodipine  5 mg and Toprol -XL 50 mg daily.  Tolerating both medications well without side effects.  Not regularly checking blood pressure at home.  Denies concerning symptoms today.  Cardiopulmonary exam is normal.  Checking labs today.  Blood pressure is at goal.  Continue amlodipine  and Toprol -XL as prescribed. 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  She was treated for this with Eliquis  and notes that the clot fully resolved.  She has been off of Eliquis  for about 6 weeks and is doing very well.  5. Vitamin D deficiency Taking vitamin D supplements on a daily basis.  No concerning symptoms.  Rechecking vitamin D today. - VITAMIN D 25 Hydroxy (Vit-D Deficiency, Fractures)  6. Fatigue, unspecified type Endorses significant fatigue and is not sure if there is a metabolic cause or if the symptoms are related to high levels of stress and poor sleep.  Plan to check labs as below. - TSH - Vitamin B12 - Iron, TIBC and Ferritin Panel  7. Caregiver stress She is the caregiver for her husband who has had worsening mental and physical status.  Part of his behavior is he needs to see her at all times and when she leaves him, he gets very upset.   He is very sedentary and obese which puts strain on her.  Does not sleep well due to worry for him and making sure that he is safe.  She is not currently treated with any medications or doing any counseling.  Offered discussion on options however she declined.  If this changes, advised her to please let us  know.  Procedures performed this visit: None.  Return in about 6 months (around 06/22/2024) for chronic disease follow up.  __________________________________ Mercedes Snook, DNP, APRN, FNP-BC Primary Care and Sports Medicine [facility] [place]

## 2023-12-23 ENCOUNTER — Encounter: Payer: Self-pay | Admitting: Medical-Surgical

## 2023-12-23 LAB — COMPREHENSIVE METABOLIC PANEL WITH GFR
ALT: 23 IU/L (ref 0–32)
AST: 32 IU/L (ref 0–40)
Albumin: 4 g/dL (ref 3.7–4.7)
Alkaline Phosphatase: 67 IU/L (ref 44–121)
BUN/Creatinine Ratio: 16 (ref 12–28)
BUN: 16 mg/dL (ref 8–27)
Bilirubin Total: <0.2 mg/dL (ref 0.0–1.2)
CO2: 22 mmol/L (ref 20–29)
Calcium: 9.5 mg/dL (ref 8.7–10.3)
Chloride: 102 mmol/L (ref 96–106)
Creatinine, Ser: 0.97 mg/dL (ref 0.57–1.00)
Globulin, Total: 2.5 g/dL (ref 1.5–4.5)
Glucose: 96 mg/dL (ref 70–99)
Potassium: 5 mmol/L (ref 3.5–5.2)
Sodium: 137 mmol/L (ref 134–144)
Total Protein: 6.5 g/dL (ref 6.0–8.5)
eGFR: 58 mL/min/{1.73_m2} — ABNORMAL LOW (ref >59)

## 2023-12-23 LAB — IRON,TIBC AND FERRITIN PANEL
Ferritin: 184 ng/mL — ABNORMAL HIGH (ref 15–150)
Iron Saturation: 24 % (ref 15–55)
Iron: 72 ug/dL (ref 27–139)
Total Iron Binding Capacity: 299 ug/dL (ref 250–450)
UIBC: 227 ug/dL (ref 118–369)

## 2023-12-23 LAB — CBC WITH DIFFERENTIAL/PLATELET
Basophils Absolute: 0.1 10*3/uL (ref 0.0–0.2)
Basos: 1 %
EOS (ABSOLUTE): 0.3 10*3/uL (ref 0.0–0.4)
Eos: 4 %
Hematocrit: 33.9 % — ABNORMAL LOW (ref 34.0–46.6)
Hemoglobin: 11.9 g/dL (ref 11.1–15.9)
Immature Grans (Abs): 0 10*3/uL (ref 0.0–0.1)
Immature Granulocytes: 0 %
Lymphocytes Absolute: 3 10*3/uL (ref 0.7–3.1)
Lymphs: 44 %
MCH: 34.2 pg — ABNORMAL HIGH (ref 26.6–33.0)
MCHC: 35.1 g/dL (ref 31.5–35.7)
MCV: 97 fL (ref 79–97)
Monocytes Absolute: 0.9 10*3/uL (ref 0.1–0.9)
Monocytes: 12 %
Neutrophils Absolute: 2.8 10*3/uL (ref 1.4–7.0)
Neutrophils: 39 %
Platelets: 262 10*3/uL (ref 150–450)
RBC: 3.48 x10E6/uL — ABNORMAL LOW (ref 3.77–5.28)
RDW: 11.9 % (ref 11.7–15.4)
WBC: 7 10*3/uL (ref 3.4–10.8)

## 2023-12-23 LAB — TSH: TSH: 7.72 u[IU]/mL — ABNORMAL HIGH (ref 0.450–4.500)

## 2023-12-23 LAB — LIPID PANEL
Chol/HDL Ratio: 2.4 ratio (ref 0.0–4.4)
Cholesterol, Total: 185 mg/dL (ref 100–199)
HDL: 78 mg/dL (ref >39)
LDL Chol Calc (NIH): 89 mg/dL (ref 0–99)
Triglycerides: 101 mg/dL (ref 0–149)
VLDL Cholesterol Cal: 18 mg/dL (ref 5–40)

## 2023-12-23 LAB — VITAMIN D 25 HYDROXY (VIT D DEFICIENCY, FRACTURES): Vit D, 25-Hydroxy: 82.6 ng/mL (ref 30.0–100.0)

## 2023-12-23 LAB — VITAMIN B12: Vitamin B-12: 449 pg/mL (ref 232–1245)

## 2023-12-26 ENCOUNTER — Ambulatory Visit (INDEPENDENT_AMBULATORY_CARE_PROVIDER_SITE_OTHER): Admitting: Medical-Surgical

## 2023-12-26 ENCOUNTER — Encounter: Payer: Self-pay | Admitting: Medical-Surgical

## 2023-12-26 VITALS — BP 149/67 | HR 60 | Resp 20 | Ht <= 58 in | Wt 103.0 lb

## 2023-12-26 DIAGNOSIS — E038 Other specified hypothyroidism: Secondary | ICD-10-CM | POA: Diagnosis not present

## 2023-12-26 MED ORDER — LEVOTHYROXINE SODIUM 25 MCG PO TABS: 25.0000 ug | ORAL_TABLET | Freq: Every day | ORAL | 3 refills | Status: AC

## 2023-12-26 NOTE — Progress Notes (Unsigned)
        Established patient visit  History, exam, impression, and plan:  There are no diagnoses linked to this encounter.   Procedures performed this visit: None.  No follow-ups on file.  __________________________________ Maryl Snook, DNP, APRN, FNP-BC Primary Care and Sports Medicine [facility] [place]

## 2023-12-27 ENCOUNTER — Encounter: Payer: Self-pay | Admitting: Medical-Surgical

## 2024-01-19 DIAGNOSIS — H73893 Other specified disorders of tympanic membrane, bilateral: Secondary | ICD-10-CM | POA: Diagnosis not present

## 2024-01-19 DIAGNOSIS — H6123 Impacted cerumen, bilateral: Secondary | ICD-10-CM | POA: Diagnosis not present

## 2024-01-19 DIAGNOSIS — H61303 Acquired stenosis of external ear canal, unspecified, bilateral: Secondary | ICD-10-CM | POA: Diagnosis not present

## 2024-01-24 ENCOUNTER — Telehealth: Payer: Self-pay | Admitting: Cardiology

## 2024-01-24 NOTE — Telephone Encounter (Signed)
 Pt requesting Provider switch from [facility] to [facility]

## 2024-01-25 DIAGNOSIS — K921 Melena: Secondary | ICD-10-CM | POA: Diagnosis not present

## 2024-01-25 DIAGNOSIS — I10 Essential (primary) hypertension: Secondary | ICD-10-CM | POA: Diagnosis not present

## 2024-01-25 DIAGNOSIS — M818 Other osteoporosis without current pathological fracture: Secondary | ICD-10-CM | POA: Diagnosis not present

## 2024-01-25 DIAGNOSIS — Z9081 Acquired absence of spleen: Secondary | ICD-10-CM | POA: Diagnosis not present

## 2024-01-25 DIAGNOSIS — R0789 Other chest pain: Secondary | ICD-10-CM | POA: Diagnosis not present

## 2024-01-25 DIAGNOSIS — Z6822 Body mass index (BMI) 22.0-22.9, adult: Secondary | ICD-10-CM | POA: Diagnosis not present

## 2024-01-25 DIAGNOSIS — M25511 Pain in right shoulder: Secondary | ICD-10-CM | POA: Diagnosis not present

## 2024-01-25 DIAGNOSIS — R143 Flatulence: Secondary | ICD-10-CM | POA: Diagnosis not present

## 2024-01-25 DIAGNOSIS — M47812 Spondylosis without myelopathy or radiculopathy, cervical region: Secondary | ICD-10-CM | POA: Diagnosis not present

## 2024-01-31 DIAGNOSIS — R198 Other specified symptoms and signs involving the digestive system and abdomen: Secondary | ICD-10-CM | POA: Diagnosis not present

## 2024-01-31 DIAGNOSIS — R140 Abdominal distension (gaseous): Secondary | ICD-10-CM | POA: Diagnosis not present

## 2024-01-31 DIAGNOSIS — K921 Melena: Secondary | ICD-10-CM | POA: Diagnosis not present

## 2024-01-31 DIAGNOSIS — I10 Essential (primary) hypertension: Secondary | ICD-10-CM | POA: Diagnosis not present

## 2024-02-01 DIAGNOSIS — R198 Other specified symptoms and signs involving the digestive system and abdomen: Secondary | ICD-10-CM | POA: Diagnosis not present

## 2024-02-01 DIAGNOSIS — I10 Essential (primary) hypertension: Secondary | ICD-10-CM | POA: Diagnosis not present

## 2024-02-01 DIAGNOSIS — R140 Abdominal distension (gaseous): Secondary | ICD-10-CM | POA: Diagnosis not present

## 2024-02-01 DIAGNOSIS — Z6822 Body mass index (BMI) 22.0-22.9, adult: Secondary | ICD-10-CM | POA: Diagnosis not present

## 2024-02-01 DIAGNOSIS — Z7983 Long term (current) use of bisphosphonates: Secondary | ICD-10-CM | POA: Diagnosis not present

## 2024-02-01 DIAGNOSIS — M818 Other osteoporosis without current pathological fracture: Secondary | ICD-10-CM | POA: Diagnosis not present

## 2024-02-06 ENCOUNTER — Encounter: Payer: Self-pay | Admitting: Medical-Surgical

## 2024-02-06 ENCOUNTER — Ambulatory Visit (INDEPENDENT_AMBULATORY_CARE_PROVIDER_SITE_OTHER): Admitting: Medical-Surgical

## 2024-02-06 VITALS — BP 137/64 | HR 63 | Resp 20 | Ht <= 58 in | Wt 102.0 lb

## 2024-02-06 DIAGNOSIS — E038 Other specified hypothyroidism: Secondary | ICD-10-CM | POA: Diagnosis not present

## 2024-02-06 NOTE — Progress Notes (Signed)
        Established patient visit  History, exam, impression, and plan:  1. Subclinical hypothyroidism (Primary) Mercedes Decker 83 year old female presenting today for follow-up on subclinical hypothyroidism.  Approximately 6 weeks ago, we started her on levothyroxine  25 mcg daily due to a TSH greater than 7 and concomitant fatigue.  She has been taking the medication daily as prescribed, tolerating fairly well although she does note that she has been sweating more and she is still tired.  Went to her doctor for osteoporosis treatment however they told her she was too dehydrated for the treatment and advised her to work on at least 64 ounces of water per day.  Also notes that her vitamin D  was elevated when she went.  Denies skin/hair/nail changes, weight fluctuations, abnormal heart rhythms, changes in bowel function, tremors, and worsened anxiety.  Plan to check TSH today to evaluate response to the medication.  If she is responding well and her TSH is normal, would like to continue the medication however if this is intolerable, we can certainly discuss other options. - TSH  Procedures performed this visit: None.  Return if symptoms worsen or fail to improve.  __________________________________ Maryl Snook, DNP, APRN, FNP-BC Primary Care and Sports Medicine [facility] [place]

## 2024-02-07 ENCOUNTER — Ambulatory Visit: Payer: Self-pay | Admitting: Medical-Surgical

## 2024-02-07 LAB — TSH: TSH: 2.3 u[IU]/mL (ref 0.450–4.500)

## 2024-02-07 NOTE — Telephone Encounter (Signed)
 Patient requesting a provider switch from Dr. Sandee Crook to Dr. Katheryne Pane.  Please confirm.

## 2024-02-13 DIAGNOSIS — Z5112 Encounter for antineoplastic immunotherapy: Secondary | ICD-10-CM | POA: Diagnosis not present

## 2024-02-13 DIAGNOSIS — M818 Other osteoporosis without current pathological fracture: Secondary | ICD-10-CM | POA: Diagnosis not present

## 2024-02-27 ENCOUNTER — Encounter: Payer: Self-pay | Admitting: Sports Medicine

## 2024-03-20 ENCOUNTER — Other Ambulatory Visit: Payer: Self-pay

## 2024-03-20 DIAGNOSIS — I10 Essential (primary) hypertension: Secondary | ICD-10-CM

## 2024-03-20 DIAGNOSIS — M48062 Spinal stenosis, lumbar region with neurogenic claudication: Secondary | ICD-10-CM

## 2024-03-20 MED ORDER — METOPROLOL SUCCINATE ER 50 MG PO TB24: 50.0000 mg | ORAL_TABLET | Freq: Every day | ORAL | 0 refills | Status: DC

## 2024-03-20 MED ORDER — GABAPENTIN 100 MG PO CAPS: ORAL_CAPSULE | ORAL | 0 refills | Status: DC

## 2024-04-02 DIAGNOSIS — G629 Polyneuropathy, unspecified: Secondary | ICD-10-CM | POA: Diagnosis not present

## 2024-04-02 DIAGNOSIS — I10 Essential (primary) hypertension: Secondary | ICD-10-CM | POA: Diagnosis not present

## 2024-04-04 DIAGNOSIS — R140 Abdominal distension (gaseous): Secondary | ICD-10-CM | POA: Diagnosis not present

## 2024-04-04 DIAGNOSIS — I10 Essential (primary) hypertension: Secondary | ICD-10-CM | POA: Diagnosis not present

## 2024-04-04 DIAGNOSIS — R198 Other specified symptoms and signs involving the digestive system and abdomen: Secondary | ICD-10-CM | POA: Diagnosis not present

## 2024-04-30 ENCOUNTER — Encounter: Payer: Self-pay | Admitting: Sports Medicine

## 2024-05-01 DIAGNOSIS — L814 Other melanin hyperpigmentation: Secondary | ICD-10-CM | POA: Diagnosis not present

## 2024-05-01 DIAGNOSIS — D229 Melanocytic nevi, unspecified: Secondary | ICD-10-CM | POA: Diagnosis not present

## 2024-05-01 DIAGNOSIS — Z872 Personal history of diseases of the skin and subcutaneous tissue: Secondary | ICD-10-CM | POA: Diagnosis not present

## 2024-05-01 DIAGNOSIS — Z85828 Personal history of other malignant neoplasm of skin: Secondary | ICD-10-CM | POA: Diagnosis not present

## 2024-05-01 DIAGNOSIS — L570 Actinic keratosis: Secondary | ICD-10-CM | POA: Diagnosis not present

## 2024-05-01 DIAGNOSIS — L821 Other seborrheic keratosis: Secondary | ICD-10-CM | POA: Diagnosis not present

## 2024-05-01 DIAGNOSIS — I10 Essential (primary) hypertension: Secondary | ICD-10-CM | POA: Diagnosis not present

## 2024-05-09 DIAGNOSIS — E559 Vitamin D deficiency, unspecified: Secondary | ICD-10-CM | POA: Diagnosis not present

## 2024-05-13 ENCOUNTER — Ambulatory Visit: Attending: Cardiovascular Disease | Admitting: Cardiovascular Disease

## 2024-05-13 ENCOUNTER — Encounter: Payer: Self-pay | Admitting: Cardiovascular Disease

## 2024-05-13 VITALS — BP 160/66 | HR 60 | Ht <= 58 in | Wt 99.2 lb

## 2024-05-13 DIAGNOSIS — I10 Essential (primary) hypertension: Secondary | ICD-10-CM | POA: Diagnosis not present

## 2024-05-13 DIAGNOSIS — E782 Mixed hyperlipidemia: Secondary | ICD-10-CM

## 2024-05-13 NOTE — Assessment & Plan Note (Signed)
 History of benign essential hypertension with blood pressure measured today at 160/66.  She is on amlodipine  and metoprolol .  Her blood pressures have been lower than this in the past.

## 2024-05-13 NOTE — Patient Instructions (Addendum)
 Medication Instructions:  Your physician recommends that you continue on your current medications as directed. Please refer to the Current Medication list given to you today.  *If you need a refill on your cardiac medications before your next appointment, please call your pharmacy*    Follow-Up: At [facility], you and your health needs are our priority.  As part of our continuing mission to provide you with exceptional heart care, our providers are all part of one team.  This team includes your primary Cardiologist (physician) and Advanced Practice Providers or APPs (Physician Assistants and Nurse Practitioners) who all work together to provide you with the care you need, when you need it.  Your next appointment:   1 year(s)  Provider:   Dorn Lesches, MD

## 2024-05-13 NOTE — Progress Notes (Signed)
 05/13/2024 [name]   February 05, 1941  [id number]  Primary Physician Willo Mini, NP Primary Cardiologist: Dorn JINNY Lesches MD [facility] [place], [place]  HPI:  Mercedes Decker is a 83 y.o. thin-appearing recently widowed Caucasian female mother of 3 children who was previously a patient of Dr. Monetta and is transitioning her care to me.  She is retired from working at [employer] and [employer].  Risk factors include treated hypertension and hyperlipidemia.  Her father died of an MI at age 20.  She is never had a heart attack or stroke.  She denies chest pain or shortness of breath.  She is fairly active and exercises as well as does garden work outside.  She did have an echo and a stress test back 01/13/2021 that were essentially normal.  She had mild basal septal hypertrophy.   Current Meds  Medication Sig   acetaminophen  (TYLENOL ) 500 MG tablet Take 500 mg by mouth every 8 (eight) hours as needed (for pain).   amLODipine  (NORVASC ) 5 MG tablet TAKE 1 TABLET(5 MG) BY MOUTH DAILY   apixaban  (ELIQUIS ) 5 MG TABS tablet Take 1 tablet (5 mg total) by mouth 2 (two) times daily. Start taking after completion of starter pack.   Ascorbic Acid (VITAMIN C) 125 MG CHEW Chew 125 mg by mouth daily.   Calcium  Carbonate (CALCIUM  600 PO) Take 600 mg by mouth daily.   Cholecalciferol (VITAMIN D3) 25 MCG (1000 UT) CAPS Take 1,000 Units by mouth daily.   cyanocobalamin  100 MCG tablet Take 1 tablet by mouth daily.   ezetimibe  (ZETIA ) 10 MG tablet Take 1 tablet (10 mg total) by mouth daily.   ferrous sulfate 324 MG TBEC Take 324 mg by mouth daily with breakfast.   gabapentin  (NEURONTIN ) 100 MG capsule One tab PO qHS for a week, then BID for a week, then TID.   levothyroxine  (SYNTHROID ) 25 MCG tablet Take 1 tablet (25 mcg total) by mouth daily.   Magnesium 250 MG TABS Take 1 tablet by mouth every morning.   metoprolol  succinate (TOPROL -XL) 50 MG 24 hr tablet Take 1 tablet (50 mg total) by mouth daily. Take with or  immediately following a meal.   OVER THE COUNTER MEDICATION Take 500 mg by mouth daily. [place]  - Mushroom type vitamin for the brain.   OVER THE COUNTER MEDICATION Total Beets - takes 1 gummy daily   Turmeric Curcumin 500 MG CAPS Take 1,500 mg by mouth daily.   valACYclovir (VALTREX) 500 MG tablet Take 500 mg by mouth daily.   vitamin E 180 MG (400 UNITS) capsule Take 400 Units by mouth daily.     Allergies  Allergen Reactions   Tape Other (See Comments)    BANDAIDS AND ADHESIVE TAPES TEAR SKIN (VERY THIN SKIN)  REQUESTING HYPOALLERGENIC TAPE.  OUCHLESS TAPE. IS OK    Wound Dressing Adhesive Other (See Comments)    BANDAIDS AND ADHESIVE TAPES TEAR SKIN (VERY THIN SKIN)  REQUESTING HYPOALLERGENIC TAPE.  OUCHLESS TAPE. IS OK   Cortisone Other (See Comments)    Flushing and worsening joint pain    Meloxicam Other (See Comments)    Too sleepy    Ace Inhibitors Cough   Atorvastatin  Other (See Comments)    Possible allergy   Gabapentin  Other (See Comments)    Caused leg cramping   Losartan  Other (See Comments)    Sweats, cramps   Vicodin [Hydrocodone-Acetaminophen ] Other (See Comments)    Pt states that she stops breathing  when she takes  Pain meds    Social History   Socioeconomic History   Marital status: Married    Spouse name: Jerilynn   Number of children: 1   Years of education: 12   Highest education level: GED or equivalent  Occupational History   Occupation: Field seismologist    Comment: retired  Tobacco Use   Smoking status: Never   Smokeless tobacco: Never   Tobacco comments:    none  Vaping Use   Vaping status: Never Used  Substance and Sexual Activity   Alcohol use: Not Currently    Comment: rarely   Drug use: No   Sexual activity: Not on file  Other Topics Concern   Not on file  Social History Narrative   Retired. Lives with her husband and her dog. 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 Works around [employer]. Walks daily. She enjoys gardening and  enjoy building bird houses.   Social Drivers of Corporate investment banker Strain: Low Risk  (01/29/2024)   [facility]   Overall Financial Resource Strain (CARDIA)    Difficulty of Paying Living Expenses: Not hard at all  Food Insecurity: No Food Insecurity (01/29/2024)   Received from [facility]   Hunger Vital Sign    Within the past 12 months, you worried that your food would run out before you got the money to buy more.: Never true    Within the past 12 months, the food you bought just didn't last and you didn't have money to get more.: Never true  Transportation Needs: No Transportation Needs (01/29/2024)   Received from [facility] - Transportation    Lack of Transportation (Medical): No    Lack of Transportation (Non-Medical): No  Physical Activity: Insufficiently Active (01/29/2024)   Received from [facility]   Exercise Vital Sign    On average, how many days per week do you engage in moderate to strenuous exercise (like a brisk walk)?: 7 days    On average, how many minutes do you engage in exercise at this level?: 20 min  Stress: Stress Concern Present (01/29/2024)   Received from [facility] - Occupational Stress Questionnaire    Feeling of Stress : To some extent  Social Connections: Socially Integrated (01/29/2024)   Received from [facility]   Social Network    How would you rate your social network (family, work, friends)?: Good participation with social networks  Intimate Partner Violence: Not At Risk (01/29/2024)   Received from [facility]   HITS    Over the last 12 months how often did your partner physically hurt you?: Never    Over the last 12 months how often did your partner insult you or talk down to you?: Never    Over the last 12 months how often did your partner threaten you with physical harm?: Never    Over the last 12 months how often did your partner scream or curse at you?: Never      Review of Systems: General: negative for chills, fever, night sweats or weight changes.  Cardiovascular: negative for chest pain, dyspnea on exertion, edema, orthopnea, palpitations, paroxysmal nocturnal dyspnea or shortness of breath Dermatological: negative for rash Respiratory: negative for cough or wheezing Urologic: negative for hematuria Abdominal: negative for nausea, vomiting, diarrhea, bright red blood per rectum, melena, or hematemesis Neurologic: negative for visual changes, syncope, or dizziness All other systems reviewed and  are otherwise negative except as noted above.    Blood pressure (!) 160/66, pulse 60, height 4' 10 (1.473 m), weight 99 lb 3.2 oz (45 kg), SpO2 96%.  General appearance: alert and no distress Neck: no adenopathy, no carotid bruit, no JVD, supple, symmetrical, trachea midline, and thyroid  not enlarged, symmetric, no tenderness/mass/nodules Lungs: clear to auscultation bilaterally Heart: regular rate and rhythm, S1, S2 normal, no murmur, click, rub or gallop Extremities: extremities normal, atraumatic, no cyanosis or edema Pulses: 2+ and symmetric Skin: Skin color, texture, turgor normal. No rashes or lesions Neurologic: Grossly normal  EKG EKG Interpretation Date/Time:  Monday May 13 2024 09:45:33 EDT Ventricular Rate:  60 PR Interval:  170 QRS Duration:  66 QT Interval:  400 QTC Calculation: 400 R Axis:   21  Text Interpretation: Normal sinus rhythm Possible Anterior infarct , age undetermined When compared with ECG of 30-Apr-2023 17:46, PREVIOUS ECG IS PRESENT Confirmed by Court Carrier [phone]) on 05/13/2024 10:09:01 AM    ASSESSMENT AND PLAN:   Mixed hyperlipidemia History of mixed hyperlipidemia on Zetia  with lipid profile performed/25/25 revealing total cholesterol 185, LDL of 89 HDL 78, acceptable for primary prevention.  Benign essential hypertension History of benign essential hypertension with blood pressure measured  today at 160/66.  She is on amlodipine  and metoprolol .  Her blood pressures have been lower than this in the past.     Carrier DOROTHA Court MD [facility], [facility] 05/13/2024 10:19 AM

## 2024-05-13 NOTE — Assessment & Plan Note (Signed)
 History of mixed hyperlipidemia on Zetia  with lipid profile performed/25/25 revealing total cholesterol 185, LDL of 89 HDL 78, acceptable for primary prevention.

## 2024-05-24 ENCOUNTER — Other Ambulatory Visit: Payer: Self-pay | Admitting: Medical-Surgical

## 2024-05-24 DIAGNOSIS — I10 Essential (primary) hypertension: Secondary | ICD-10-CM

## 2024-05-30 ENCOUNTER — Ambulatory Visit

## 2024-05-31 ENCOUNTER — Ambulatory Visit: Admitting: Urgent Care

## 2024-05-31 VITALS — BP 149/64 | HR 66 | Temp 98.6°F | Ht <= 58 in | Wt 97.0 lb

## 2024-05-31 DIAGNOSIS — J069 Acute upper respiratory infection, unspecified: Secondary | ICD-10-CM

## 2024-05-31 DIAGNOSIS — R0981 Nasal congestion: Secondary | ICD-10-CM

## 2024-05-31 LAB — POC SOFIA 2 FLU + SARS ANTIGEN FIA
Influenza A, POC: NEGATIVE
Influenza B, POC: NEGATIVE
SARS Coronavirus 2 Ag: NEGATIVE

## 2024-05-31 MED ORDER — BENZONATATE 100 MG PO CAPS: 100.0000 mg | ORAL_CAPSULE | Freq: Three times a day (TID) | ORAL | 0 refills | Status: DC | PRN

## 2024-05-31 MED ORDER — CETIRIZINE HCL 5 MG PO TABS: 5.0000 mg | ORAL_TABLET | Freq: Every day | ORAL | 0 refills | Status: DC

## 2024-05-31 MED ORDER — FLUTICASONE PROPIONATE 50 MCG/ACT NA SUSP: 1.0000 | Freq: Two times a day (BID) | NASAL | 6 refills | Status: AC | PRN

## 2024-05-31 NOTE — Progress Notes (Signed)
 Established Patient Office Visit  Subjective:  Patient ID: Mercedes Decker, female    DOB: 24-Apr-1941  Age: 83 y.o. MRN: [id number]  Chief Complaint  Patient presents with   Cough    All symptoms onset 1 week ago   Nasal Congestion   Sore Throat    HPI  Discussed the use of AI scribe software for clinical note transcription with the patient, who gave verbal consent to proceed.  History of Present Illness   Mercedes Decker is an 83 year old female who presents with symptoms of a respiratory infection.  She has been experiencing symptoms consistent with a respiratory infection for over a week, which began during a recent trip to [place]  for a family reunion and to transport her late husband's ashes. She was exposed to a large group of people, although none appeared symptomatic at the time.  Her symptoms started with a severe episode of diarrhea, followed by a non-productive cough and frequent nasal congestion with clear nasal discharge. She experiences significant fatigue, stating that 'all I want to do is lay down and sleep,' which is unusual for her as she describes herself as a very active person.  She has experienced chills over the past couple of days. No fever, shortness of breath, chest tightness, or wheezing.  She has been using Theraflu and Alka-Seltzer to manage her symptoms. Additionally, she uses a nasal spray for dry eyes but not for nasal congestion.       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   Greater trochanteric bursitis of right hip 10/23/2017   Pancreatic tumor 05/08/2017   Dry eye syndrome of both lacrimal glands 04/24/2017   Pseudophakia of right eye 04/24/2017   Hoarseness 02/10/2017   Right shoulder injury 08/18/2016   Vitamin D  deficiency 07/08/2016   Right carpal tunnel syndrome 05/01/2016   TIA (transient ischemic attack) 03/08/2016   Primary osteoarthritis of first carpometacarpal joint of right hand 03/07/2016   Patient has active power of attorney for health care 11/30/2015   Murmur, cardiac 09/11/2012   Arthritis 09/03/2012   Osteoporosis  L fem neck  Dexa 07/2014  -3.1, 03/2021 -3.6 09/03/2012   Migraine 09/03/2012   Mixed hyperlipidemia 09/03/2012   GERD (gastroesophageal reflux disease) 09/03/2012   Benign essential hypertension 08/17/2011   Past Medical History:  Diagnosis Date   Arthritis    Blood clot in vein    Cataract Both removed   Clotting disorder    Heart murmur    History of anemia 09/03/2012   Hypertension    Migraine    Osteoporosis    Prediabetes 08/28/2018   S/P hysterectomy 09/03/2012   Past Surgical History:  Procedure Laterality Date   ABDOMINAL HYSTERECTOMY     BACK SURGERY     BREAST SURGERY     BUNIONECTOMY     EYE SURGERY     HERNIA REPAIR     INCISIONAL HERNIA REPAIR     KNEE ARTHROSCOPY  SPLENECTOMY, TOTAL  2019   Social History   Tobacco Use   Smoking status: Never   Smokeless tobacco: Never   Tobacco comments:    none  Vaping Use   Vaping status: Never Used  Substance Use Topics   Alcohol use: Not Currently    Comment: rarely   Drug use: No      ROS: as noted in HPI  Objective:     BP (!) 149/64 (BP Location: Right Arm, Patient Position: Sitting, Cuff Size: Small)   Pulse 66   Temp 98.6 F (37 C) (Oral)   Ht 4' 10 (1.473 m)   Wt 97 lb (44 kg)   SpO2 97%   BMI 20.27 kg/m  BP Readings from Last 3  Encounters:  05/31/24 (!) 149/64  05/13/24 (!) 160/66  02/06/24 137/64   Wt Readings from Last 3 Encounters:  05/31/24 97 lb (44 kg)  05/13/24 99 lb 3.2 oz (45 kg)  02/06/24 102 lb 0.6 oz (46.3 kg)      Physical Exam Vitals and nursing note reviewed.  Constitutional:      General: She is not in acute distress.    Appearance: Normal appearance. She is well-developed and normal weight. She is not ill-appearing, toxic-appearing or diaphoretic.  HENT:     Head: Normocephalic and atraumatic.     Right Ear: Ear canal and external ear normal. No drainage, swelling or tenderness. No middle ear effusion. There is no impacted cerumen. Tympanic membrane is not injected, scarred, perforated or erythematous.     Left Ear: Ear canal and external ear normal. No drainage, swelling or tenderness.  No middle ear effusion. There is no impacted cerumen. Tympanic membrane is not injected, scarred, perforated or erythematous.     Nose: Congestion and rhinorrhea present. Rhinorrhea is clear.     Right Turbinates: Not enlarged or swollen.     Left Turbinates: Not enlarged or swollen.     Right Sinus: No maxillary sinus tenderness or frontal sinus tenderness.     Left Sinus: No maxillary sinus tenderness or frontal sinus tenderness.     Mouth/Throat:     Lips: Pink.     Mouth: Mucous membranes are moist.     Pharynx: Oropharynx is clear. Uvula midline. No pharyngeal swelling, oropharyngeal exudate, posterior oropharyngeal erythema, uvula swelling or postnasal drip.  Cardiovascular:     Rate and Rhythm: Normal rate and regular rhythm.  Pulmonary:     Effort: Pulmonary effort is normal. No respiratory distress.     Breath sounds: Normal breath sounds. No stridor. No wheezing, rhonchi or rales.  Musculoskeletal:     Cervical back: Normal range of motion and neck supple. No rigidity or tenderness.  Lymphadenopathy:     Cervical: No cervical adenopathy.  Skin:    General: Skin is warm and dry.      Coloration: Skin is not jaundiced.     Findings: No bruising, erythema or rash.  Neurological:     General: No focal deficit present.     Mental Status: She is alert and oriented to person, place, and time.     Motor: No weakness.     Gait: Gait normal.      Results for orders placed or performed in visit on 05/31/24  POC SOFIA 2 FLU + SARS ANTIGEN FIA  Result Value Ref Range   Influenza A, POC Negative Negative   Influenza B, POC Negative Negative   SARS Coronavirus 2 Ag Negative Negative  The ASCVD Risk score (Arnett DK, et al., 2019) failed to calculate for the following reasons:   The 2019 ASCVD risk score is only valid for ages 66 to 47  Assessment & Plan:  Viral URI with cough -     Fluticasone Propionate; Place 1 spray into both nostrils 2 (two) times daily as needed for allergies or rhinitis.  Dispense: 16 g; Refill: 6 -     Benzonatate ; Take 1 capsule (100 mg total) by mouth 3 (three) times daily as needed for cough.  Dispense: 20 capsule; Refill: 0 -     Cetirizine HCl; Take 1 tablet (5 mg total) by mouth daily.  Dispense: 30 tablet; Refill: 0  Nasal congestion -     POC SOFIA 2 FLU + SARS ANTIGEN FIA  Assessment and Plan    Acute upper respiratory infection with fatigue and chills Symptoms include cough, sneezing, congestion, fatigue, and chills.  COVID-19 and flu tests negative. Adenovirus considered due to gastrointestinal and respiratory symptoms, but lacks specific antiviral therapy. - Continue Theraflu and Alka-Seltzer for symptom relief. - cetirizine and flonase for congestion assistance - tessalon  for cough - OTC oscillococcinum for body aches/ fatigue - RTC precautions discussed        No follow-ups on file.   Benton LITTIE Gave, PA

## 2024-05-31 NOTE — Patient Instructions (Addendum)
 Your flu and covid test are negative. Your symptoms are most consistent with a viral upper respiratory infection.  Please start taking the medications prescribed today: Cetirizine once daily Fluticasone intranasally twice daily  Use the cough medication as needed, up to three times daily  Steam from a hot shower can help with congestion. Eucalyptus can be helpful.  Try OTC oscillococcinum which can help with fatigue and body aches. If your symptoms persist >1 week, please return for follow up/ recheck. If you develop fever or shortness of breath, return sooner.

## 2024-06-15 ENCOUNTER — Other Ambulatory Visit: Payer: Self-pay | Admitting: Medical-Surgical

## 2024-06-15 DIAGNOSIS — I10 Essential (primary) hypertension: Secondary | ICD-10-CM

## 2024-06-20 ENCOUNTER — Encounter: Payer: Self-pay | Admitting: Medical-Surgical

## 2024-06-20 NOTE — Progress Notes (Unsigned)
        Established patient visit   History of Present Illness   Discussed the use of AI scribe software for clinical note transcription with the patient, who gave verbal consent to proceed.  History of Present Illness           Physical Exam   Physical Exam Assessment & Plan   Problem List Items Addressed This Visit       Cardiovascular and Mediastinum   Benign essential hypertension - Primary     Digestive   GERD (gastroesophageal reflux disease) (Chronic)     Other   Mixed hyperlipidemia (Chronic)   Neurogenic claudication due to lumbar spinal stenosis   Prediabetes   Vitamin D  deficiency   Other Visit Diagnoses       Post-menopausal          Assessment and Plan             Follow up   No follow-ups on file. __________________________________ Zada FREDRIK Palin, DNP, APRN, FNP-BC Primary Care and Sports Medicine [facility]

## 2024-06-21 ENCOUNTER — Ambulatory Visit: Admitting: Medical-Surgical

## 2024-06-21 ENCOUNTER — Encounter: Payer: Self-pay | Admitting: Medical-Surgical

## 2024-06-21 VITALS — BP 128/68 | HR 60 | Resp 20 | Ht <= 58 in | Wt 97.0 lb

## 2024-06-21 DIAGNOSIS — E559 Vitamin D deficiency, unspecified: Secondary | ICD-10-CM

## 2024-06-21 DIAGNOSIS — Z780 Asymptomatic menopausal state: Secondary | ICD-10-CM

## 2024-06-21 DIAGNOSIS — E782 Mixed hyperlipidemia: Secondary | ICD-10-CM | POA: Diagnosis not present

## 2024-06-21 DIAGNOSIS — R7303 Prediabetes: Secondary | ICD-10-CM | POA: Diagnosis not present

## 2024-06-21 DIAGNOSIS — I10 Essential (primary) hypertension: Secondary | ICD-10-CM

## 2024-06-21 DIAGNOSIS — K219 Gastro-esophageal reflux disease without esophagitis: Secondary | ICD-10-CM

## 2024-06-21 DIAGNOSIS — Z23 Encounter for immunization: Secondary | ICD-10-CM | POA: Diagnosis not present

## 2024-06-21 DIAGNOSIS — E038 Other specified hypothyroidism: Secondary | ICD-10-CM

## 2024-06-21 DIAGNOSIS — M48062 Spinal stenosis, lumbar region with neurogenic claudication: Secondary | ICD-10-CM

## 2024-06-21 LAB — POCT GLYCOSYLATED HEMOGLOBIN (HGB A1C): Hemoglobin A1C: 5.7 % — AB (ref 4.0–5.6)

## 2024-06-21 MED ORDER — METOPROLOL SUCCINATE ER 50 MG PO TB24: 50.0000 mg | ORAL_TABLET | Freq: Every day | ORAL | 3 refills | Status: AC

## 2024-06-21 MED ORDER — AMLODIPINE BESYLATE 5 MG PO TABS: ORAL_TABLET | ORAL | 0 refills | Status: DC

## 2024-06-21 MED ORDER — AMLODIPINE BESYLATE 5 MG PO TABS: ORAL_TABLET | ORAL | 3 refills | Status: DC

## 2024-06-21 NOTE — Progress Notes (Signed)
 Established Patient Office Visit  Subjective   Patient ID: Mercedes Decker, female    DOB: Mar 09, 1941  Age: 83 y.o. MRN: [id number]  Chief Complaint  Patient presents with   Diabetes   Hypertension    Diabetes  Hypertension  83 year old female presents for a 6 month follow up on the following chronic diseases:  Essential Hypertension:  Patiens is currently being managed and treated with amlodipine  5 mg and metoprolol  succinate 50 mg daily. She does not routinely check her blood pressure at home. Denies any side effects to medications.Denies having chest pain, shortness of breath, heart palpitations, dizziness, or lower extremity edema.   Subclinical Hypothyroidism Patient is currently being treated with levothyroxine  25 mcg daily. Patient states that she is doing well and denies having any side effects to her current medications.   Prediabetes: Previous A1C 5.4% in April of 2025. A1C today is 5.7%. Patient is currently managed with diet and exercise. She does not routinely check her blood sugars at home. She exercises walking 30 minutes 7 days per week and limiting carbohydrate and sugar intake in her diet.  Mixed Hyperlipidemia Patient is currently taking zetia  10 mg for her hyperlipidemia. She states that she is doing well with her medication, and is managing with her diet decreasing foods high in fat.   Post Menopausal Patient is due for bone density. DEXA scan ordered    Review of Systems  Constitutional: Negative.   HENT: Negative.    Eyes: Negative.   Respiratory: Negative.    Cardiovascular: Negative.   Gastrointestinal: Negative.   Genitourinary: Negative.   Musculoskeletal: Negative.   Skin: Negative.   Neurological: Negative.   Endo/Heme/Allergies: Negative.       Objective:     BP 128/68 (BP Location: Right Arm, Cuff Size: Small)   Pulse 60   Resp 20   Ht 4' 10 (1.473 m)   Wt 44 kg   SpO2 98%   BMI 20.28 kg/m  BP Readings from Last 3  Encounters:  06/21/24 128/68  05/31/24 (!) 149/64  05/13/24 (!) 160/66   Wt Readings from Last 3 Encounters:  06/21/24 44 kg  05/31/24 44 kg  05/13/24 45 kg      Physical Exam Vitals and nursing note reviewed.  Constitutional:      General: She is not in acute distress.    Appearance: Normal appearance. She is normal weight.  Cardiovascular:     Rate and Rhythm: Normal rate and regular rhythm.     Pulses: Normal pulses.     Heart sounds: Normal heart sounds, S1 normal and S2 normal.  Pulmonary:     Effort: Pulmonary effort is normal.     Breath sounds: Normal breath sounds.  Musculoskeletal:     Right lower leg: No edema.     Left lower leg: No edema.  Skin:    General: Skin is warm and dry.  Neurological:     General: No focal deficit present.     Mental Status: She is alert and oriented to person, place, and time.  Psychiatric:        Mood and Affect: Mood normal.        Behavior: Behavior normal.        Thought Content: Thought content normal.        Judgment: Judgment normal.      No results found for any visits on 06/21/24.  Last metabolic panel Lab Results  Component Value Date   GLUCOSE  96 12/22/2023   NA 137 12/22/2023   K 5.0 12/22/2023   CL 102 12/22/2023   CO2 22 12/22/2023   BUN 16 12/22/2023   CREATININE 0.97 12/22/2023   EGFR 58 (L) 12/22/2023   CALCIUM  9.5 12/22/2023   PROT 6.5 12/22/2023   ALBUMIN 4.0 12/22/2023   LABGLOB 2.5 12/22/2023   BILITOT <0.2 12/22/2023   ALKPHOS 67 12/22/2023   AST 32 12/22/2023   ALT 23 12/22/2023   ANIONGAP 10 10/14/2023   Last hemoglobin A1c Lab Results  Component Value Date   HGBA1C 5.4 12/22/2023   Last thyroid  functions Lab Results  Component Value Date   TSH 2.300 02/06/2024   FREET4 0.9 12/29/2021    The ASCVD Risk score (Arnett DK, et al., 2019) failed to calculate for the following reasons:   The 2019 ASCVD risk score is only valid for ages 57 to 74    Assessment & Plan:   1. Benign  essential hypertension (Primary) -Continue amlodipine  5 mg and metoprolol  succinate 50 mg daily -BP elevated on arrival. Recheck at goal -Follow up in 6 months  2. Mixed hyperlipidemia -Continue taking Zetia  10 mg daily -Recommended continue limiting foods high in fat -Follow up in 6 months  3. Prediabetes -Managed with diet and exercise -Patient exercises 30 minutes daily with walking and limiting carbohydrate and sugar intake -Follow up in 6 months - POCT HgB A1C- 5.7% today  4. Post-menopausal - DG Bone Density; Future  5. Subclinical hypothyroidism -Continue on levothyroxine  25 mcg daily -Follow up in 6 months - TSH- recheck today  6. Need for influenza vaccination - Flu vaccine HIGH DOSE PF(Fluzone Trivalent)    Return in about 6 months (around 12/20/2024) for Chronic Disease follow up.    Derrek JINNY Freund, NP Student

## 2024-06-21 NOTE — Progress Notes (Signed)
 Medical screening examination/treatment was performed by qualified clinical staff member and as supervising provider I was immediately available for consultation/collaboration. I have reviewed documentation and agree with assessment and plan.  Thayer Ohm, DNP, APRN, FNP-BC [facility] and Sports Medicine

## 2024-06-22 ENCOUNTER — Ambulatory Visit: Payer: Self-pay | Admitting: Medical-Surgical

## 2024-06-22 LAB — TSH: TSH: 3.57 u[IU]/mL (ref 0.450–4.500)

## 2024-06-23 ENCOUNTER — Other Ambulatory Visit: Payer: Self-pay | Admitting: Medical-Surgical

## 2024-06-25 ENCOUNTER — Ambulatory Visit

## 2024-07-20 ENCOUNTER — Other Ambulatory Visit: Payer: Self-pay | Admitting: Medical-Surgical

## 2024-07-29 ENCOUNTER — Encounter: Payer: Self-pay | Admitting: Medical-Surgical

## 2024-07-29 MED ORDER — AMLODIPINE BESYLATE 5 MG PO TABS: ORAL_TABLET | ORAL | 2 refills | Status: AC

## 2024-07-29 NOTE — Telephone Encounter (Signed)
  Called pharmacy and was told that they only received a 30 day supply of Amlodipine  on 10/324/2025 by Zada Palin, NP .  Will resend the remaining refills showing on script in chart to pharmacy .

## 2024-08-01 ENCOUNTER — Ambulatory Visit

## 2024-08-12 MED ORDER — GABAPENTIN 100 MG PO CAPS: ORAL_CAPSULE | ORAL | 1 refills | Status: AC

## 2024-08-12 NOTE — Addendum Note (Signed)
 Addended by: BONNY JON DEL on: 08/12/2024 09:09 AM   Modules accepted: Orders

## 2024-08-12 NOTE — Addendum Note (Signed)
 Addended byBETHA WILLO MINI on: 08/12/2024 11:28 AM   Modules accepted: Orders

## 2024-08-28 ENCOUNTER — Encounter: Payer: Self-pay | Admitting: Medical-Surgical

## 2024-08-28 ENCOUNTER — Ambulatory Visit: Admitting: Medical-Surgical

## 2024-08-28 VITALS — BP 112/71 | HR 75 | Temp 98.5°F | Resp 18 | Ht <= 58 in | Wt 99.0 lb

## 2024-08-28 DIAGNOSIS — J069 Acute upper respiratory infection, unspecified: Secondary | ICD-10-CM

## 2024-08-28 DIAGNOSIS — Z09 Encounter for follow-up examination after completed treatment for conditions other than malignant neoplasm: Secondary | ICD-10-CM | POA: Diagnosis not present

## 2024-08-28 NOTE — Progress Notes (Signed)
" ° °       Established patient visit   History of Present Illness   Discussed the use of AI scribe software for clinical note transcription with the patient, who gave verbal consent to proceed.  History of Present Illness   Mercedes Decker is an 83 year old female who presents for follow-up after a fall resulting in facial bruising.  Hospital discharge follow-up/fall with facial trauma - Clem[place] while entering her back door to retrieve her glasses, landing on her face on a concrete slab - Significant periorbital bruising present - No loss of consciousness - Emergency room evaluation included CT scan with no significant findings  Headache and upper respiratory symptoms - Headaches associated with recent upper respiratory infection contracted from her daughter - Using Flonase , Zyrtec , and cough pills from a prior visit with good relief  Ocular symptoms - No eye pain, blurriness, or significant soreness - Visual disturbance described as seeing 'a lot of squiggly things' - Eye specialist visit scheduled for January 23  Physical Exam   Physical Exam Vitals reviewed.  Constitutional:      General: She is not in acute distress.    Appearance: Normal appearance.  HENT:     Head: Normocephalic and atraumatic.  Cardiovascular:     Rate and Rhythm: Normal rate and regular rhythm.     Pulses: Normal pulses.     Heart sounds: Normal heart sounds. No murmur heard.    No friction rub. No gallop.  Pulmonary:     Effort: Pulmonary effort is normal. No respiratory distress.     Breath sounds: Normal breath sounds. No wheezing.  Skin:    General: Skin is warm and dry.  Neurological:     Mental Status: She is alert and oriented to person, place, and time.  Psychiatric:        Mood and Affect: Mood normal.        Behavior: Behavior normal.        Thought Content: Thought content normal.        Judgment: Judgment normal.    Assessment & Plan   Hospital discharge follow-up/contusion of  face Facial contusion post-fall with no significant CT findings. Bruising improving, no headaches or neurological symptoms. - Advised rest and caution to prevent further falls.  Acute upper respiratory infection Symptoms consistent with acute upper respiratory infection, likely contracted from daughter. Current medications providing relief. - Continue current medications as needed. - Advised to contact if additional medications are required.     Follow up   Return if symptoms worsen or fail to improve. __________________________________ Zada FREDRIK Palin, DNP, APRN, FNP-BC Primary Care and Sports Medicine [facility] [place] "

## 2024-09-26 ENCOUNTER — Other Ambulatory Visit: Payer: Self-pay | Admitting: Medical-Surgical

## 2024-09-26 ENCOUNTER — Encounter: Payer: Self-pay | Admitting: Medical-Surgical

## 2024-09-26 DIAGNOSIS — J069 Acute upper respiratory infection, unspecified: Secondary | ICD-10-CM

## 2024-09-26 MED ORDER — CETIRIZINE HCL 5 MG PO TABS: 5.0000 mg | ORAL_TABLET | Freq: Every day | ORAL | 3 refills | Status: AC

## 2024-12-20 ENCOUNTER — Ambulatory Visit: Admitting: Medical-Surgical
